# Patient Record
Sex: Female | Born: 1990 | Race: White | Hispanic: No | Marital: Single | Smoking: Never smoker
Health system: Southern US, Community
[De-identification: ages and names within clinical notes are randomized; demographics above are authoritative.]

## PROBLEM LIST (undated history)

## (undated) ENCOUNTER — Emergency Department (HOSPITAL_COMMUNITY): Admission: EM | Payer: 59

## (undated) DIAGNOSIS — M545 Low back pain, unspecified: Secondary | ICD-10-CM

## (undated) DIAGNOSIS — G4730 Sleep apnea, unspecified: Secondary | ICD-10-CM

## (undated) DIAGNOSIS — B9689 Other specified bacterial agents as the cause of diseases classified elsewhere: Secondary | ICD-10-CM

## (undated) DIAGNOSIS — J039 Acute tonsillitis, unspecified: Secondary | ICD-10-CM

## (undated) DIAGNOSIS — K760 Fatty (change of) liver, not elsewhere classified: Secondary | ICD-10-CM

## (undated) DIAGNOSIS — N898 Other specified noninflammatory disorders of vagina: Secondary | ICD-10-CM

## (undated) DIAGNOSIS — G8929 Other chronic pain: Secondary | ICD-10-CM

## (undated) DIAGNOSIS — E119 Type 2 diabetes mellitus without complications: Secondary | ICD-10-CM

## (undated) DIAGNOSIS — J811 Chronic pulmonary edema: Secondary | ICD-10-CM

## (undated) DIAGNOSIS — K8020 Calculus of gallbladder without cholecystitis without obstruction: Secondary | ICD-10-CM

## (undated) DIAGNOSIS — Z808 Family history of malignant neoplasm of other organs or systems: Secondary | ICD-10-CM

## (undated) DIAGNOSIS — D649 Anemia, unspecified: Secondary | ICD-10-CM

## (undated) DIAGNOSIS — N3010 Interstitial cystitis (chronic) without hematuria: Secondary | ICD-10-CM

## (undated) DIAGNOSIS — N760 Acute vaginitis: Secondary | ICD-10-CM

## (undated) DIAGNOSIS — T8859XA Other complications of anesthesia, initial encounter: Secondary | ICD-10-CM

## (undated) DIAGNOSIS — Z8719 Personal history of other diseases of the digestive system: Secondary | ICD-10-CM

## (undated) DIAGNOSIS — E039 Hypothyroidism, unspecified: Secondary | ICD-10-CM

## (undated) DIAGNOSIS — E079 Disorder of thyroid, unspecified: Secondary | ICD-10-CM

## (undated) DIAGNOSIS — Z803 Family history of malignant neoplasm of breast: Secondary | ICD-10-CM

## (undated) DIAGNOSIS — K649 Unspecified hemorrhoids: Secondary | ICD-10-CM

## (undated) DIAGNOSIS — F419 Anxiety disorder, unspecified: Secondary | ICD-10-CM

## (undated) DIAGNOSIS — Z7689 Persons encountering health services in other specified circumstances: Secondary | ICD-10-CM

## (undated) DIAGNOSIS — B379 Candidiasis, unspecified: Secondary | ICD-10-CM

## (undated) DIAGNOSIS — N94810 Vulvar vestibulitis: Secondary | ICD-10-CM

## (undated) DIAGNOSIS — T4145XA Adverse effect of unspecified anesthetic, initial encounter: Secondary | ICD-10-CM

## (undated) HISTORY — DX: Other specified bacterial agents as the cause of diseases classified elsewhere: B96.89

## (undated) HISTORY — DX: Acute tonsillitis, unspecified: J03.90

## (undated) HISTORY — DX: Other specified noninflammatory disorders of vagina: N89.8

## (undated) HISTORY — DX: Unspecified hemorrhoids: K64.9

## (undated) HISTORY — DX: Vulvar vestibulitis: N94.810

## (undated) HISTORY — DX: Interstitial cystitis (chronic) without hematuria: N30.10

## (undated) HISTORY — DX: Anxiety disorder, unspecified: F41.9

## (undated) HISTORY — DX: Family history of malignant neoplasm of breast: Z80.3

## (undated) HISTORY — DX: Persons encountering health services in other specified circumstances: Z76.89

## (undated) HISTORY — DX: Family history of malignant neoplasm of other organs or systems: Z80.8

## (undated) HISTORY — DX: Personal history of other diseases of the digestive system: Z87.19

## (undated) HISTORY — DX: Disorder of thyroid, unspecified: E07.9

## (undated) HISTORY — DX: Acute vaginitis: N76.0

## (undated) HISTORY — DX: Candidiasis, unspecified: B37.9

## (undated) HISTORY — PX: BACK SURGERY: SHX140

## (undated) HISTORY — DX: Anemia, unspecified: D64.9

---
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
## 2012-05-08 ENCOUNTER — Encounter: Payer: Self-pay | Admitting: *Deleted

## 2012-05-08 ENCOUNTER — Encounter: Payer: Self-pay | Admitting: Adult Health

## 2012-05-08 ENCOUNTER — Telehealth: Payer: Self-pay | Admitting: Adult Health

## 2012-05-08 NOTE — Telephone Encounter (Signed)
Spoke with pt. Pt is on LoLoestrin. Gave 3 sample boxes of LoLoestrin. Pt had appt scheduled for 05/08/12 but had to reschedule due to Genelle Gather being out of the office. Pt to pick up samples tomorrow and schedule an appt.

## 2012-09-04 ENCOUNTER — Telehealth: Payer: Self-pay | Admitting: Adult Health

## 2012-09-04 NOTE — Telephone Encounter (Signed)
Karen Hoover has appt next week to discuss pills and she got samples today.

## 2012-09-04 NOTE — Telephone Encounter (Signed)
Pt mother Liborio Nixon states pt came into office today requesting RX for [employer], was told by front staff pt would need an appt with Cyril Mourning, NP before she could refill BCP due to pt not being seen for a long period of time. Pt Mother, Liborio Nixon, states does not have the $30.00 copay for [facility] refill and requesting to speak with Victorino Dike.

## 2012-09-12 ENCOUNTER — Encounter: Payer: Self-pay | Admitting: Adult Health

## 2012-09-12 ENCOUNTER — Ambulatory Visit (INDEPENDENT_AMBULATORY_CARE_PROVIDER_SITE_OTHER): Payer: BC Managed Care – PPO | Admitting: Adult Health

## 2012-09-12 VITALS — BP 134/78 | Ht 64.0 in | Wt 233.5 lb

## 2012-09-12 DIAGNOSIS — Z7689 Persons encountering health services in other specified circumstances: Secondary | ICD-10-CM

## 2012-09-12 MED ORDER — NORETHIN-ETH ESTRAD-FE BIPHAS 1 MG-10 MCG / 10 MCG PO TABS: 1.0000 | ORAL_TABLET | Freq: Every day | ORAL | Status: DC

## 2012-09-12 NOTE — Progress Notes (Signed)
Subjective:     Patient ID: Karen Hoover, female   DOB: 1990/08/13, 22 y.o.   MRN: [id number]  HPI Karen Hoover is a 22 year old white female single, in for follow up, taking lo loestrin for period management. No complaints.Has not have sex.  Review of Systems See HPI Reviewed past medical,surgical, social and family history. Reviewed medications and allergies.     Objective:   Physical Exam BP 134/78  Ht 5\' 4"  (1.626 m)  Wt 233 lb 8 oz (105.915 kg)  BMI 40.06 kg/m2   Skin warm and dry.Lungs: clear to ausculation bilaterally. Cardiovascular: regular rate and rhythm. Assessment:     Period management    Plan:     Refill lo loestrin # 1 pack, take 1 daily with 11 refills and I gave her samples Number of samples 4  Lot number [id number]     Exp date 9/15   Follow up in 1 year or prn

## 2012-09-12 NOTE — Patient Instructions (Addendum)
Follow up in 1 year  Continue lo loestrin

## 2013-02-05 ENCOUNTER — Other Ambulatory Visit: Payer: Self-pay | Admitting: Internal Medicine

## 2013-02-05 DIAGNOSIS — M545 Low back pain, unspecified: Secondary | ICD-10-CM

## 2013-02-14 ENCOUNTER — Ambulatory Visit
Admission: RE | Admit: 2013-02-14 | Discharge: 2013-02-14 | Disposition: A | Discharge status: Home / Self Care | Payer: BC Managed Care – PPO | Source: Ambulatory Visit | Attending: Internal Medicine | Admitting: Internal Medicine

## 2013-02-14 DIAGNOSIS — M545 Low back pain, unspecified: Secondary | ICD-10-CM

## 2013-03-21 ENCOUNTER — Telehealth: Payer: Self-pay | Admitting: Adult Health

## 2013-03-21 MED ORDER — NORETHIN-ETH ESTRAD-FE BIPHAS 1 MG-10 MCG / 10 MCG PO TABS: 1.0000 | ORAL_TABLET | Freq: Every day | ORAL | Status: DC

## 2013-03-21 NOTE — Telephone Encounter (Signed)
Pt stated that you told her to call when she was finished with her samples.

## 2013-03-21 NOTE — Telephone Encounter (Signed)
Can get samples, for now.

## 2014-03-13 ENCOUNTER — Ambulatory Visit (INDEPENDENT_AMBULATORY_CARE_PROVIDER_SITE_OTHER): Payer: BC Managed Care – PPO | Admitting: Adult Health

## 2014-03-13 ENCOUNTER — Other Ambulatory Visit (HOSPITAL_COMMUNITY)
Admission: RE | Admit: 2014-03-13 | Discharge: 2014-03-13 | Disposition: A | Discharge status: Home / Self Care | Payer: BC Managed Care – PPO | Source: Ambulatory Visit | Attending: Obstetrics and Gynecology | Admitting: Obstetrics and Gynecology

## 2014-03-13 ENCOUNTER — Encounter: Payer: Self-pay | Admitting: Adult Health

## 2014-03-13 VITALS — BP 110/54 | HR 80 | Ht 64.0 in | Wt 240.5 lb

## 2014-03-13 DIAGNOSIS — Z01419 Encounter for gynecological examination (general) (routine) without abnormal findings: Secondary | ICD-10-CM | POA: Insufficient documentation

## 2014-03-13 DIAGNOSIS — B379 Candidiasis, unspecified: Secondary | ICD-10-CM | POA: Insufficient documentation

## 2014-03-13 DIAGNOSIS — N898 Other specified noninflammatory disorders of vagina: Secondary | ICD-10-CM

## 2014-03-13 DIAGNOSIS — Z7689 Persons encountering health services in other specified circumstances: Secondary | ICD-10-CM

## 2014-03-13 HISTORY — DX: Other specified noninflammatory disorders of vagina: N89.8

## 2014-03-13 HISTORY — DX: Candidiasis, unspecified: B37.9

## 2014-03-13 HISTORY — DX: Persons encountering health services in other specified circumstances: Z76.89

## 2014-03-13 LAB — POCT WET PREP (WET MOUNT): WBC, Wet Prep HPF POC: POSITIVE

## 2014-03-13 MED ORDER — FLUCONAZOLE 150 MG PO TABS: ORAL_TABLET | ORAL | Status: DC

## 2014-03-13 MED ORDER — NYSTATIN-TRIAMCINOLONE 100000-0.1 UNIT/GM-% EX OINT: 1.0000 "application " | TOPICAL_OINTMENT | Freq: Two times a day (BID) | CUTANEOUS | Status: DC

## 2014-03-13 MED ORDER — NORETHIN-ETH ESTRAD-FE BIPHAS 1 MG-10 MCG / 10 MCG PO TABS: 1.0000 | ORAL_TABLET | Freq: Every day | ORAL | Status: DC

## 2014-03-13 NOTE — Patient Instructions (Signed)
Monilial Vaginitis Vaginitis in a soreness, swelling and redness (inflammation) of the vagina and vulva. Monilial vaginitis is not a sexually transmitted infection. CAUSES  Yeast vaginitis is caused by yeast (candida) that is normally found in your vagina. With a yeast infection, the candida has overgrown in number to a point that upsets the chemical balance. SYMPTOMS   White, thick vaginal discharge.  Swelling, itching, redness and irritation of the vagina and possibly the lips of the vagina (vulva).  Burning or painful urination.  Painful intercourse. DIAGNOSIS  Things that may contribute to monilial vaginitis are:  Postmenopausal and virginal states.  Pregnancy.  Infections.  Being tired, sick or stressed, especially if you had monilial vaginitis in the past.  Diabetes. Good control will help lower the chance.  Birth control pills.  Tight fitting garments.  Using bubble bath, feminine sprays, douches or deodorant tampons.  Taking certain medications that kill germs (antibiotics).  Sporadic recurrence can occur if you become ill. TREATMENT  Your caregiver will give you medication.  There are several kinds of anti monilial vaginal creams and suppositories specific for monilial vaginitis. For recurrent yeast infections, use a suppository or cream in the vagina 2 times a week, or as directed.  Anti-monilial or steroid cream for the itching or irritation of the vulva may also be used. Get your caregiver's permission.  Painting the vagina with methylene blue solution may help if the monilial cream does not work.  Eating yogurt may help prevent monilial vaginitis. HOME CARE INSTRUCTIONS   Finish all medication as prescribed.  Do not have sex until treatment is completed or after your caregiver tells you it is okay.  Take warm sitz baths.  Do not douche.  Do not use tampons, especially scented ones.  Wear cotton underwear.  Avoid tight pants and panty  hose.  Tell your sexual partner that you have a yeast infection. They should go to their caregiver if they have symptoms such as mild rash or itching.  Your sexual partner should be treated as well if your infection is difficult to eliminate.  Practice safer sex. Use condoms.  Some vaginal medications cause latex condoms to fail. Vaginal medications that harm condoms are:  Cleocin cream.  Butoconazole (Femstat).  Terconazole (Terazol) vaginal suppository.  Miconazole (Monistat) (may be purchased over the counter). SEEK MEDICAL CARE IF:   You have a temperature by mouth above 102 F (38.9 C).  The infection is getting worse after 2 days of treatment.  The infection is not getting better after 3 days of treatment.  You develop blisters in or around your vagina.  You develop vaginal bleeding, and it is not your menstrual period.  You have pain when you urinate.  You develop intestinal problems.  You have pain with sexual intercourse. Document Released: 11/03/2004 Document Revised: 04/18/2011 Document Reviewed: 07/18/2008 [facility] Patient Information 2015 [place]Care, [place]. This information is not intended to replace advice given to you by your health care provider. Make sure you discuss any questions you have with your health care provider. Physical in 1 year

## 2014-03-13 NOTE — Progress Notes (Addendum)
Patient ID: Karen Hoover, female   DOB: February 06, 1991, 24 y.o.   MRN: [id number] History of Present Illness: Karen Hoover is a 24 year old white female, single in for first pap and physical.Complains of vaginal discharge and irritation at times and occasional odor that comes and goes.She is happy with lo loestrin for periods.   Current Medications, Allergies, Past Medical History, Past Surgical History, Family History and Social History were [facility].     Review of Systems: Patient denies any headaches, hearing loss, fatigue, blurred vision, shortness of breath, chest pain, abdominal pain, problems with bowel movements, urination, or intercourse. Not having sex, no joint pain has pain in back from 2 bulging discs and sees Dr Channing Muttersoy, no mood swings.    Physical Exam:BP 110/54 mmHg  Pulse 80  Ht 5\' 4"  (1.626 m)  Wt 240 lb 8 oz (109.09 kg)  BMI 41.26 kg/m2  LMP 03/06/2014  General:  Well developed, well nourished, no acute distress Skin:  Warm and dry Neck:  Midline trachea, normal thyroid, has good ROM and no lymphadenopathy  Lungs; Clear to auscultation bilaterally Breast:  No dominant palpable mass, retraction, or nipple discharge Cardiovascular: Regular rate and rhythm Abdomen:  Soft, non tender, no hepatosplenomegaly Pelvic:  External genitalia is normal in appearance, except has redness in creases of labia, no lesions.  The vagina has white discharge, no odor.    The cervix is smooth and nulliparous, pap performed. Urethra pink and normal size. Uterus is felt to be normal size, shape, and contour.No bladder tenderness or masses.  No  adnexal masses or tenderness noted.wet prep:+WBC and yeast Extremities/musculoskeletal:  No swelling or varicosities noted, no clubbing or cyanosis  Psych:  No mood changes,alert and cooperative, seems happy, she just bought her first car   Impression: Well woman gyn exam with pap Vaginal discharge Yeast Period  management    Plan: Rx diflucan 150 mg # 2 1 now and 1 in 3 days with 1 refill Rx mytrex cream use bid prn to affected area Refilled lo loestrin x 1 year Physical in 1 year Review handout on yeast Labs with PCP

## 2014-03-17 ENCOUNTER — Telehealth: Payer: Self-pay | Admitting: Adult Health

## 2014-03-17 NOTE — Telephone Encounter (Signed)
Feels much better, but is it OK to swim and it is, just shower and pat dry afterwards

## 2014-03-18 LAB — CYTOLOGY - PAP

## 2014-03-25 ENCOUNTER — Telehealth: Payer: Self-pay | Admitting: Adult Health

## 2014-03-25 NOTE — Telephone Encounter (Signed)
Left message x 1. JSY 

## 2014-03-26 NOTE — Telephone Encounter (Signed)
Spoke with pt. Pt took Diflucan, 2 tabs, and is still using Mycolog cream. Pt states yeast is better, but not gone. Still having discharge and itching. Please advise. Thanks!! JSY

## 2014-03-26 NOTE — Telephone Encounter (Signed)
Left message x 2. JSY 

## 2014-03-26 NOTE — Telephone Encounter (Signed)
Left message to get diflucan refilled and use cream can call in am

## 2014-04-04 ENCOUNTER — Telehealth: Payer: Self-pay | Admitting: Adult Health

## 2014-04-04 NOTE — Telephone Encounter (Signed)
Has been on 2 rounds of Diflucan and Mycolog ointment. Still having vaginal itching and vaginal discharge. Also, burning. Advised would need to be rechecked. Pt voiced understanding. Call transferred to front desk for appt. JSY

## 2014-04-10 ENCOUNTER — Encounter: Payer: Self-pay | Admitting: Adult Health

## 2014-04-10 ENCOUNTER — Ambulatory Visit (INDEPENDENT_AMBULATORY_CARE_PROVIDER_SITE_OTHER): Payer: BC Managed Care – PPO | Admitting: Adult Health

## 2014-04-10 VITALS — BP 138/62 | HR 74 | Ht 64.0 in | Wt 235.0 lb

## 2014-04-10 DIAGNOSIS — N760 Acute vaginitis: Secondary | ICD-10-CM | POA: Insufficient documentation

## 2014-04-10 DIAGNOSIS — A499 Bacterial infection, unspecified: Secondary | ICD-10-CM | POA: Diagnosis not present

## 2014-04-10 DIAGNOSIS — B9689 Other specified bacterial agents as the cause of diseases classified elsewhere: Secondary | ICD-10-CM | POA: Insufficient documentation

## 2014-04-10 DIAGNOSIS — N898 Other specified noninflammatory disorders of vagina: Secondary | ICD-10-CM | POA: Diagnosis not present

## 2014-04-10 HISTORY — DX: Other specified bacterial agents as the cause of diseases classified elsewhere: B96.89

## 2014-04-10 HISTORY — DX: Other specified bacterial agents as the cause of diseases classified elsewhere: N76.0

## 2014-04-10 LAB — POCT WET PREP (WET MOUNT): WBC, Wet Prep HPF POC: POSITIVE

## 2014-04-10 MED ORDER — METRONIDAZOLE 0.75 % VA GEL: VAGINAL | Status: DC

## 2014-04-10 NOTE — Patient Instructions (Signed)
Bacterial Vaginosis Bacterial vaginosis is a vaginal infection that occurs when the normal balance of bacteria in the vagina is disrupted. It results from an overgrowth of certain bacteria. This is the most common vaginal infection in women of childbearing age. Treatment is important to prevent complications, especially in pregnant women, as it can cause a premature delivery. CAUSES  Bacterial vaginosis is caused by an increase in harmful bacteria that are normally present in smaller amounts in the vagina. Several different kinds of bacteria can cause bacterial vaginosis. However, the reason that the condition develops is not fully understood. RISK FACTORS Certain activities or behaviors can put you at an increased risk of developing bacterial vaginosis, including:  Having a new sex partner or multiple sex partners.  Douching.  Using an intrauterine device (IUD) for contraception. Women do not get bacterial vaginosis from toilet seats, bedding, swimming pools, or contact with objects around them. SIGNS AND SYMPTOMS  Some women with bacterial vaginosis have no signs or symptoms. Common symptoms include:  Grey vaginal discharge.  A fishlike odor with discharge, especially after sexual intercourse.  Itching or burning of the vagina and vulva.  Burning or pain with urination. DIAGNOSIS  Your health care provider will take a medical history and examine the vagina for signs of bacterial vaginosis. A sample of vaginal fluid may be taken. Your health care provider will look at this sample under a microscope to check for bacteria and abnormal cells. A vaginal pH test may also be done.  TREATMENT  Bacterial vaginosis may be treated with antibiotic medicines. These may be given in the form of a pill or a vaginal cream. A second round of antibiotics may be prescribed if the condition comes back after treatment.  HOME CARE INSTRUCTIONS   Only take over-the-counter or prescription medicines as  directed by your health care provider.  If antibiotic medicine was prescribed, take it as directed. Make sure you finish it even if you start to feel better.  Do not have sex until treatment is completed.  Tell all sexual partners that you have a vaginal infection. They should see their health care provider and be treated if they have problems, such as a mild rash or itching.  Practice safe sex by using condoms and only having one sex partner. SEEK MEDICAL CARE IF:   Your symptoms are not improving after 3 days of treatment.  You have increased discharge or pain.  You have a fever. MAKE SURE YOU:   Understand these instructions.  Will watch your condition.  Will get help right away if you are not doing well or get worse. FOR MORE INFORMATION  Centers for Disease Control and Prevention, Division of STD Prevention: SolutionApps.co.zawww.cdc.gov/std American Sexual Health Association (ASHA): www.ashastd.org  Document Released: 01/24/2005 Document Revised: 11/14/2012 Document Reviewed: 09/05/2012 [facility] Patient Information 2015 [place]Care, [place]LL[place]. This information is not intended to replace advice given to you by your health care provider. Make sure you discuss any questions you have with your health care provider. Recheck in 11 days Use metrogel 1 applicator in vagina x 5 nights

## 2014-04-10 NOTE — Progress Notes (Signed)
Subjective:     Patient ID: Karen Hoover, female   DOB: August 26, 1990, 24 y.o.   MRN: [id number]  HPI Karen Hoover is a 24 year old white female back in complaining of vaginal discharge with irritation and odor, she was treated for yeast recently.Grandmother concerned for diabetes, as it runs in family. Has never had sex.  Review of Systems Vaginal discharge with odor and irritation, all other systems negative  Reviewed past medical,surgical, social and family history. Reviewed medications and allergies.     Objective:   Physical Exam BP 138/62 mmHg  Pulse 74  Ht 5\' 4"  (1.626 m)  Wt 235 lb (106.595 kg)  BMI 40.32 kg/m2  LMP 03/06/2014 Skin warm and dry.Pelvic: external genitalia is normal in appearance, mild redness right labial area, no lesions, vagina: white discharge with odor and red side walls and tender,urethra has no lesions or masses noted, cervix:smooth, uterus: normal size, shape and contour, non tender, no masses felt, adnexa: no masses or tenderness noted. Bladder is non tender and no masses felt. Wet prep: + for clue cells and +WBCs.    Assessment:     Vaginal discharge  BV    Plan:     Rx metrogel use 1 applicator at hs x 5 nights Check CMP and A1c Return in 11 days for recheck Review handout on BV

## 2014-04-11 ENCOUNTER — Telehealth: Payer: Self-pay | Admitting: Adult Health

## 2014-04-11 LAB — COMPREHENSIVE METABOLIC PANEL
A/G RATIO: 1.7 (ref 1.1–2.5)
ALBUMIN: 4.1 g/dL (ref 3.5–5.5)
ALK PHOS: 65 IU/L (ref 39–117)
ALT: 16 IU/L (ref 0–32)
AST: 14 IU/L (ref 0–40)
BILIRUBIN TOTAL: 0.5 mg/dL (ref 0.0–1.2)
BUN/Creatinine Ratio: 15 (ref 8–20)
BUN: 9 mg/dL (ref 6–20)
CALCIUM: 9.3 mg/dL (ref 8.7–10.2)
CO2: 21 mmol/L (ref 18–29)
CREATININE: 0.6 mg/dL (ref 0.57–1.00)
Chloride: 102 mmol/L (ref 97–108)
GFR calc non Af Amer: 131 mL/min/{1.73_m2} (ref >59)
GFR, EST AFRICAN AMERICAN: 151 mL/min/{1.73_m2} (ref >59)
GLUCOSE: 103 mg/dL — AB (ref 65–99)
Globulin, Total: 2.4 g/dL (ref 1.5–4.5)
Potassium: 4.1 mmol/L (ref 3.5–5.2)
Sodium: 139 mmol/L (ref 134–144)
Total Protein: 6.5 g/dL (ref 6.0–8.5)

## 2014-04-11 LAB — HEMOGLOBIN A1C
ESTIMATED AVERAGE GLUCOSE: 114 mg/dL
HEMOGLOBIN A1C: 5.6 % (ref 4.8–5.6)

## 2014-04-11 NOTE — Telephone Encounter (Signed)
Left message A1c 5.6 and BS 103

## 2014-04-18 ENCOUNTER — Telehealth: Payer: Self-pay | Admitting: *Deleted

## 2014-04-18 NOTE — Telephone Encounter (Signed)
Pt states saw Karen MourningJennifer Griffin, NP on 04/10/2014 was given gel for BV not any better. Pt to continue gel until her appt on Monday with Karen MourningJennifer Griffin, NP. Pt verbalized understanding.

## 2014-04-21 ENCOUNTER — Telehealth: Payer: Self-pay | Admitting: *Deleted

## 2014-04-21 ENCOUNTER — Ambulatory Visit (INDEPENDENT_AMBULATORY_CARE_PROVIDER_SITE_OTHER): Payer: BC Managed Care – PPO | Admitting: Adult Health

## 2014-04-21 ENCOUNTER — Encounter: Payer: Self-pay | Admitting: Adult Health

## 2014-04-21 VITALS — BP 122/62 | HR 84 | Ht 65.0 in | Wt 232.0 lb

## 2014-04-21 DIAGNOSIS — N94810 Vulvar vestibulitis: Secondary | ICD-10-CM | POA: Diagnosis not present

## 2014-04-21 DIAGNOSIS — N898 Other specified noninflammatory disorders of vagina: Secondary | ICD-10-CM

## 2014-04-21 HISTORY — DX: Vulvar vestibulitis: N94.810

## 2014-04-21 MED ORDER — FLUOCINONIDE-E 0.05 % EX CREA: 1.0000 "application " | TOPICAL_CREAM | Freq: Two times a day (BID) | CUTANEOUS | Status: DC

## 2014-04-21 NOTE — Telephone Encounter (Signed)
Left message I don't think its related to uniforms

## 2014-04-21 NOTE — Progress Notes (Signed)
Subjective:     Patient ID: Karen Hoover, female   DOB: November 29, 1990, 24 y.o.   MRN: [id number]  HPI Karen Hoover is a 24 year old white female in complains of discharge and irritation still, may be a little better.  Review of Systems +vaginal discharge and irritation, all other systems negative Reviewed past medical,surgical, social and family history. Reviewed medications and allergies.     Objective:   Physical Exam BP 122/62 mmHg  Pulse 84  Ht 5\' 5"  (1.651 m)  Wt 232 lb (105.235 kg)  BMI 38.61 kg/m2    Skin warm and dry.Pelvic: external genitalia is normal in appearance no lesions, vagina: white discharge without odor,urethra has no lesions or masses noted, cervix: tiny, uterus: normal size, shape and contour, non tender, no masses felt, adnexa: no masses or tenderness noted. Bladder is non tender and no masses felt. Dr Karen HiddenEure in for co exam and pt is tender with Qtip at vestibular area near hymen.She told me she has back pain and is seeing Dr Karen Hoover tomorrow.  Assessment:    Vulvar vestibulitis  Vaginal discharge    Plan:     Rx lidex 0.05% cream to use bid #30 gms with 1 refill Follow up in 4 weeks

## 2014-04-21 NOTE — Patient Instructions (Signed)
Use lidex 2 x daily  Follow up in 4 weeks    Vulvar vestibulitis

## 2014-05-07 ENCOUNTER — Telehealth: Payer: Self-pay | Admitting: Adult Health

## 2014-05-07 NOTE — Telephone Encounter (Signed)
Still not feeling better, keep using the cream and see Dr Despina HiddenEure next week, I will be out of town

## 2014-05-12 ENCOUNTER — Encounter: Payer: Self-pay | Admitting: Obstetrics & Gynecology

## 2014-05-12 ENCOUNTER — Ambulatory Visit (INDEPENDENT_AMBULATORY_CARE_PROVIDER_SITE_OTHER): Payer: BC Managed Care – PPO | Admitting: Obstetrics & Gynecology

## 2014-05-12 VITALS — BP 120/60 | HR 96 | Ht 64.0 in | Wt 230.5 lb

## 2014-05-12 DIAGNOSIS — N94810 Vulvar vestibulitis: Secondary | ICD-10-CM

## 2014-05-12 MED ORDER — LIDOCAINE 5 % EX OINT: TOPICAL_OINTMENT | CUTANEOUS | Status: DC

## 2014-05-12 NOTE — Progress Notes (Signed)
Patient ID: Karen Hoover, female   DOB: 04-19-1990, 24 y.o.   MRN: [id number]   Chief Complaint  Patient presents with  . Follow-up    vaginal burning, itching and pain; Lidex cream may have helped a little     HPI:    24 y.o. G0P0000 Patient's last menstrual period was 04/28/2014 (approximate).  Seeing in follow-up. She had been seeing Cyril Mourning by tobacco examined her at her last visit. She has a positive Q-tip test and I diagnosed her with all vulvar vestibulitis and try to an empiric trial of topical sterile She states that has been ineffective     Current outpatient prescriptions:  .  ferrous sulfate 325 (65 FE) MG tablet, Take 325 mg by mouth daily., Disp: , Rfl:  .  fluocinonide-emollient (LIDEX-E) 0.05 % cream, Apply 1 application topically 2 (two) times daily., Disp: 30 g, Rfl: 1 .  levothyroxine (SYNTHROID, LEVOTHROID) 50 MCG tablet, Take 50 mcg by mouth daily. , Disp: , Rfl:  .  Norethindrone-Ethinyl Estradiol-Fe Biphas (LO LOESTRIN FE) 1 MG-10 MCG / 10 MCG tablet, Take 1 tablet by mouth daily., Disp: 1 Package, Rfl: 11  Problem Pertinent ROS:       No burning with urination, frequency or urgency No nausea, vomiting or diarrhea Nor fever chills or other constitutional symptoms   Extended ROS:        PMFSH:             Past Medical History  Diagnosis Date  . Anemia   . Thyroid disease   . Vaginal discharge 03/13/2014  . Yeast infection 03/13/2014  . Encounter for menstrual regulation 03/13/2014  . Back pain     2 herniated disc sees Dr Channing Mutters  . BV (bacterial vaginosis) 04/10/2014  . Vestibulitis, vulvar 04/21/2014    Past Surgical History  Procedure Laterality Date  . No past surgeries      OB History    Gravida Para Term Preterm AB TAB SAB Ectopic Multiple Living        No Known Allergies  History   Social History  . Marital Status: Single    Spouse Name: N/A  . Number of Children: N/A  . Years of Education: N/A    Social History Main Topics  . Smoking status: Never Smoker   . Smokeless tobacco: Never Used  . Alcohol Use: No  . Drug Use: No  . Sexual Activity: No   Other Topics Concern  . None   Social History Narrative    Family History  Problem Relation Age of Onset  . Diabetes Mother   . Thyroid disease Mother   . Thyroid disease Brother   . Diabetes Maternal Grandmother      Examination:  Vitals:  Blood pressure 120/60, pulse 96, height  (1.626 m), weight 230 lb 8 oz (104.554 kg), last menstrual period 04/28/2014.    Physical Examination:      Vulva:  NEFG, , some erythema, very specific vulvar vestibular Q-tip test is positive once again, so it is been verified and separate occasions, of note outside the area of the vulvar vaginal vestibule is completely nontender Vagina:  normal mucosa,      DATA orders and reviews: Labs were not ordered today:   Imaging studies were not ordered today:    Lab tests were not reviewed today:    Imaging studies were not reviewed today:    I did not  independently review/view images, tracing or specimen(not simply the report) myself.  Prescription Drug Management:  New Prescriptions: Lidocaine jelly 2% Renewed Prescriptions:   Current prescription changes:     Impression/Plan(Problem Based): 1.  Vulvar vestibulitis      (follow up of a pre-existing problem) : Additional workup is needed:  Lidocaine jelly 2%, patient is really not responded to the topical steroids which of course not surprisingly told that the patient was started on, so we will use lidocaine as a topical    Follow Up:   1  months     Face to face time:  15 minutes  Greater than 50% of the visit time was spent in counseling and coordination of care with the patient.  The summary and outline of the counseling and care coordination is summarized in the note above.   All questions were answered.

## 2014-05-19 ENCOUNTER — Ambulatory Visit: Payer: BC Managed Care – PPO | Admitting: Adult Health

## 2014-06-10 ENCOUNTER — Ambulatory Visit (INDEPENDENT_AMBULATORY_CARE_PROVIDER_SITE_OTHER): Payer: BC Managed Care – PPO | Admitting: Obstetrics & Gynecology

## 2014-06-10 ENCOUNTER — Encounter: Payer: Self-pay | Admitting: Obstetrics & Gynecology

## 2014-06-10 VITALS — BP 120/70 | HR 72 | Ht 64.0 in | Wt 225.0 lb

## 2014-06-10 DIAGNOSIS — N94810 Vulvar vestibulitis: Secondary | ICD-10-CM | POA: Diagnosis not present

## 2014-06-10 NOTE — Progress Notes (Signed)
Patient ID: Karen Hoover, female   DOB: April 08, 1990, 24 y.o.   MRN: [id number] Chief Complaint  Patient presents with  . Follow-up   Pt has had excellent response to the lidocaine but of course it only treats her symptoms She is wondering how long she will have to put up with it  Discussed surgical options but would prefer to wait if we can  Follow up in 3 months If you need to see prior she can call and schedule  Cont lidocaine as needed prn     Face to face time:  15 minutes  Greater than 50% of the visit time was spent in counseling and coordination of care with the patient.  The summary and outline of the counseling and care coordination is summarized in the note above.   All questions were answered.

## 2014-06-16 ENCOUNTER — Telehealth: Payer: Self-pay | Admitting: Obstetrics & Gynecology

## 2014-06-16 NOTE — Telephone Encounter (Signed)
Pt states the lidocaine is not helping with the vulvar vestibulitis would like a referral for a second opinion before proceeding with surgery. Pt aware Dr. Despina HiddenEure will not be back in office until tomorrow.

## 2014-06-17 NOTE — Telephone Encounter (Signed)
patient is welcome to seek out a second opinion for her vulvar vestibulitis, but she will have to set that up on her own. We cannot set them up because it would be a conflict of interest.

## 2014-06-17 NOTE — Telephone Encounter (Signed)
Pt informed will need to seek out a second opinion on her own per Dr. Despina HiddenEure. Pt states she feels confident in Dr. Despina HiddenEure DX and treatment just wanted second opinion before proceeding with surgery for her own reassurance.

## 2014-06-30 DIAGNOSIS — Z029 Encounter for administrative examinations, unspecified: Secondary | ICD-10-CM

## 2014-09-16 ENCOUNTER — Ambulatory Visit: Payer: BC Managed Care – PPO | Admitting: Obstetrics & Gynecology

## 2014-12-30 ENCOUNTER — Telehealth: Payer: Self-pay | Admitting: Adult Health

## 2014-12-30 NOTE — Telephone Encounter (Signed)
Left message x 1. JSY 

## 2014-12-31 NOTE — Telephone Encounter (Signed)
Spoke with pt. Pt has noticed some rectal bleeding, off and on. I advised she would need to be seen. Pt voiced understanding. Call transferred to front desk for appt. JSY

## 2014-12-31 NOTE — Telephone Encounter (Signed)
Left message x 2. JSY 

## 2015-01-06 ENCOUNTER — Ambulatory Visit: Payer: BC Managed Care – PPO | Admitting: Adult Health

## 2015-01-12 ENCOUNTER — Ambulatory Visit (INDEPENDENT_AMBULATORY_CARE_PROVIDER_SITE_OTHER): Payer: 59 | Admitting: Adult Health

## 2015-01-12 ENCOUNTER — Encounter: Payer: Self-pay | Admitting: Adult Health

## 2015-01-12 VITALS — BP 118/60 | HR 76 | Ht 64.0 in | Wt 247.5 lb

## 2015-01-12 DIAGNOSIS — Z1389 Encounter for screening for other disorder: Secondary | ICD-10-CM | POA: Diagnosis not present

## 2015-01-12 DIAGNOSIS — Z1212 Encounter for screening for malignant neoplasm of rectum: Secondary | ICD-10-CM | POA: Diagnosis not present

## 2015-01-12 DIAGNOSIS — L298 Other pruritus: Secondary | ICD-10-CM | POA: Diagnosis not present

## 2015-01-12 DIAGNOSIS — N94810 Vulvar vestibulitis: Secondary | ICD-10-CM | POA: Diagnosis not present

## 2015-01-12 DIAGNOSIS — Z8719 Personal history of other diseases of the digestive system: Secondary | ICD-10-CM | POA: Diagnosis not present

## 2015-01-12 DIAGNOSIS — N898 Other specified noninflammatory disorders of vagina: Secondary | ICD-10-CM

## 2015-01-12 DIAGNOSIS — K649 Unspecified hemorrhoids: Secondary | ICD-10-CM | POA: Insufficient documentation

## 2015-01-12 HISTORY — DX: Other specified noninflammatory disorders of vagina: N89.8

## 2015-01-12 HISTORY — DX: Unspecified hemorrhoids: K64.9

## 2015-01-12 HISTORY — DX: Personal history of other diseases of the digestive system: Z87.19

## 2015-01-12 LAB — POCT URINALYSIS DIPSTICK
GLUCOSE UA: NEGATIVE
LEUKOCYTES UA: NEGATIVE
Nitrite, UA: NEGATIVE
RBC UA: NEGATIVE

## 2015-01-12 LAB — HEMOCCULT GUIAC POC 1CARD (OFFICE): Fecal Occult Blood, POC: NEGATIVE

## 2015-01-12 MED ORDER — PHENAZOPYRIDINE HCL 200 MG PO TABS: 200.0000 mg | ORAL_TABLET | Freq: Three times a day (TID) | ORAL | Status: DC

## 2015-01-12 MED ORDER — HYDROCORTISONE ACE-PRAMOXINE 1-1 % RE CREA: 1.0000 "application " | TOPICAL_CREAM | Freq: Two times a day (BID) | RECTAL | Status: DC

## 2015-01-12 NOTE — Progress Notes (Signed)
Subjective:     Patient ID: Karen Hoover, female   DOB: 06/04/1990, 24 y.o.   MRN: [id number]  HPI [name] is a  24 year old white female who has been treated in past for vestibulitis and has had therapy for it,and sees Dr Channing Muttersoy and Laurian Brim'Toole for back pain, in complaining of pain in vagina again and itching and had rectal bleeding about 2 weeks ago, no pain with it.  Review of Systems Patient denies any headaches, hearing loss, fatigue, blurred vision, shortness of breath, chest pain, abdominal pain, problems with bowel movements, urination, or intercourse(not having sex). No joint pain or mood swings.See HPI for positives. Reviewed past medical,surgical, social and family history. Reviewed medications and allergies.     Objective:   Physical Exam BP 118/60 mmHg  Pulse 76  Ht 5\' 4"  (1.626 m)  Wt 247 lb 8 oz (112.265 kg)  BMI 42.46 kg/m2   Urine negative, Skin warm and dry.Pelvic: external genitalia is normal in appearance no lesions, vagina: scant discharge without odor,+tenderness anterior and posterior vault,urethra has no lesions or masses noted, cervix:smooth,no CMT, uterus: normal size, shape and contour, non tender, no masses felt, adnexa: no masses or tenderness noted. Bladder is non tender and no masses felt. On rectal exam, has good tone, and has internal hemorrhoid, hemoccult negative.  Will try pyridium to see if helps. Assessment:    Vestibulitis  Vaginal itching  Hemorrhoids History of rectal bleeding    Plan:    Rx pyridium 200 mg 1 tid x 10 days Rx anal pram cream use bid prn Decrease caffeine,spicey food,chocolate and coffee     Follow up in 10 days

## 2015-01-12 NOTE — Patient Instructions (Signed)
Take pyridium tid x 10 days Use anal pram 2 x daily as needed Follow up in 10 days

## 2015-01-22 ENCOUNTER — Ambulatory Visit (INDEPENDENT_AMBULATORY_CARE_PROVIDER_SITE_OTHER): Payer: 59 | Admitting: Adult Health

## 2015-01-22 ENCOUNTER — Encounter: Payer: Self-pay | Admitting: Adult Health

## 2015-01-22 VITALS — BP 118/60 | HR 84 | Ht 64.0 in | Wt 249.0 lb

## 2015-01-22 DIAGNOSIS — N3010 Interstitial cystitis (chronic) without hematuria: Secondary | ICD-10-CM

## 2015-01-22 HISTORY — DX: Interstitial cystitis (chronic) without hematuria: N30.10

## 2015-01-22 MED ORDER — PHENAZOPYRIDINE HCL 200 MG PO TABS: 200.0000 mg | ORAL_TABLET | Freq: Three times a day (TID) | ORAL | Status: DC

## 2015-01-22 NOTE — Patient Instructions (Signed)
Interstitial Cystitis Interstitial cystitis is a condition that causes inflammation of the bladder. The bladder is a hollow organ in the lower part of your abdomen. It stores urine after the urine is made by your kidneys. With interstitial cystitis, you may have pain in the bladder area. You may also have a frequent and urgent need to urinate. The severity of interstitial cystitis can vary from person to person. You may have flare-ups of the condition, and then it may go away for a while. For many people who have this condition, it becomes a long-term problem. CAUSES The cause of this condition is not known. RISK FACTORS This condition is more likely to develop in women. SYMPTOMS Symptoms of interstitial cystitis vary, and they can change over time. Symptoms may include:  Discomfort or pain in the bladder area. This can range from mild to severe. The pain may change in intensity as the bladder fills with urine or as it empties.  Pelvic pain.  An urgent need to urinate.  Frequent urination.  Pain during sexual intercourse.  Pinpoint bleeding on the bladder wall. For women, the symptoms often get worse during menstruation. DIAGNOSIS This condition is diagnosed by evaluating your symptoms and ruling out other causes. A physical exam will be done. Various tests may be done to rule out other conditions. Common tests include:  Urine tests.  Cystoscopy. In this test, a tool that is like a very thin telescope is used to look into your bladder.  Biopsy. This involves taking a sample of tissue from the bladder wall to be examined under a microscope. TREATMENT There is no cure for interstitial cystitis, but treatment methods are available to control your symptoms. Work closely with your health care provider to find the treatments that will be most effective for you. Treatment options may include:  Medicines to relieve pain and to help reduce the number of times that you feel the need to  urinate.  Bladder training. This involves learning ways to control when you urinate, such as:  Urinating at scheduled times.  Training yourself to delay urination.  Doing exercises (Kegel exercises) to strengthen the muscles that control urine flow.  Lifestyle changes, such as changing your diet or taking steps to control stress.  Use of a device that provides electrical stimulation in order to reduce pain.  A procedure that stretches your bladder by filling it with air or fluid.  Surgery. This is rare. It is only done for extreme cases if other treatments do not help. HOME CARE INSTRUCTIONS  Take medicines only as directed by your health care provider.  Use bladder training techniques as directed.  Keep a bladder diary to find out which foods, liquids, or activities make your symptoms worse.  Use your bladder diary to schedule bathroom trips. If you are away from home, plan to be near a bathroom at each of your scheduled times.  Make sure you urinate just before you leave the house and just before you go to bed.  Do Kegel exercises as directed by your health care provider.  Do not drink alcohol.  Do not use any tobacco products, including cigarettes, chewing tobacco, or electronic cigarettes. If you need help quitting, ask your health care provider.  Make dietary changes as directed by your health care provider. You may need to avoid spicy foods and foods that contain a high amount of potassium.  Limit your drinking of beverages that stimulate urination. These include soda, coffee, and tea.  Keep all follow-up   visits as directed by your health care provider. This is important. SEEK MEDICAL CARE IF:  Your symptoms do not get better after treatment.  Your pain and discomfort are getting worse.  You have more frequent urges to urinate.  You have a fever. SEEK IMMEDIATE MEDICAL CARE IF:  You are not able to control your bladder at all.   This information is not  intended to replace advice given to you by your health care provider. Make sure you discuss any questions you have with your health care provider.   Document Released: 09/25/2003 Document Revised: 02/14/2014 Document Reviewed: 10/01/2013 Elsevier Interactive Patient Education 2016 [employer] See Dr Despina HiddenEure in 1 week Continue pyridium

## 2015-01-22 NOTE — Progress Notes (Signed)
Subjective:     Patient ID: Karen Hoover, female   DOB: May 12, 1990, 24 y.o.   MRN: [id number]  HPI Karen Hoover is a 24 year old white female back in follow up of trying pyridium for vagina area discomfort, has had vestibulitis in past, and had hemorrhoid. She says hemorrhoids fine and is feeling some better since taking pyridium.  Review of Systems Patient denies any headaches, hearing loss, fatigue, blurred vision, shortness of breath, chest pain, abdominal pain, problems with bowel movements, urination, or intercourse(not having sex). No joint pain or mood swings.See HPI for positives. Reviewed past medical,surgical, social and family history. Reviewed medications and allergies.     Objective:   Physical Exam BP 118/60 mmHg  Pulse 84  Ht 5\' 4"  (1.626 m)  Wt 249 lb (112.946 kg)  BMI 42.72 kg/m2 Skin warm and dry.Pelvic: external genitalia is normal in appearance no lesions, vagina:normal in appearance, only tender at about 1 0'clock,urethra has no lesions or masses noted, cervix:smooth, no CMT uterus: normal size, shape and contour, non tender, no masses felt, adnexa: no masses or tenderness noted. Bladder is non tender and no masses felt.    Assessment:     ? IC (interstitial cystitis)    Plan:     Refilled pyridium 200 mg #30 take 1 tid  Return in 1 week to talk with Dr Karen Hoover about trying DMSO Review handout on IC

## 2015-01-29 ENCOUNTER — Ambulatory Visit (INDEPENDENT_AMBULATORY_CARE_PROVIDER_SITE_OTHER): Payer: 59 | Admitting: Obstetrics & Gynecology

## 2015-01-29 ENCOUNTER — Encounter: Payer: Self-pay | Admitting: Obstetrics & Gynecology

## 2015-01-29 VITALS — BP 120/70 | HR 78 | Wt 245.0 lb

## 2015-01-29 DIAGNOSIS — N3010 Interstitial cystitis (chronic) without hematuria: Secondary | ICD-10-CM | POA: Diagnosis not present

## 2015-01-29 MED ORDER — PENTOSAN POLYSULFATE SODIUM 100 MG PO CAPS: ORAL_CAPSULE | ORAL | Status: DC

## 2015-01-29 NOTE — Progress Notes (Signed)
Patient ID: Karen Hoover, female   DOB: [birth year], 24 y.o.   MRN: [id number]      Chief Complaint  Patient presents with  . Follow-up    taking pyridium. having leg cramp at night.    Blood pressure 120/70, pulse 78, weight 245 lb (111.131 kg), last menstrual period 12/04/2014.  24 y.o. G0P0000 Patient's last menstrual period was 12/04/2014. The current method of family planning is OCP (estrogen/progesterone).  Subjective Pt with some relief on pyridium with her symptoms counselled on overlap of IC and vestibulitis Will trial elmiron and dmso  Objective   Pertinent ROS No burning with urination, frequency or urgency No nausea, vomiting or diarrhea Nor fever chills or other constitutional symptoms   Labs or studies     Impression Diagnoses this Encounter::   ICD-9-CM ICD-10-CM   1. Interstitial cystitis 595.1 N30.10     Established relevant diagnosis(es): Vulvar vestibulitis  Plan/Recommendations: Meds ordered this encounter  Medications  . pentosan polysulfate (ELMIRON) 100 MG capsule    Sig: Take 2 tablets twice daily    Dispense:  120 capsule    Refill:  11    Labs or Scans Ordered: No orders of the defined types were placed in this encounter.    Management:: Begin elmiron, bring back for DMSO  Follow up 3  weeks       Face to face time:  15 minutes  Greater than 50% of the visit time was spent in counseling and coordination of care with the patient.  The summary and outline of the counseling and care coordination is summarized in the note above.   All questions were answered.

## 2015-02-11 MED FILL — LEVOTHYROXINE 50 MCG TABLET: 50 | 90 days supply | Qty: 90 | Fill #0

## 2015-02-11 MED FILL — LO LOESTRIN FE 1-10 TABLET: 1 MG-10 MCG | 28 days supply | Qty: 28 | Fill #0

## 2015-02-19 ENCOUNTER — Ambulatory Visit (INDEPENDENT_AMBULATORY_CARE_PROVIDER_SITE_OTHER): Payer: 59 | Admitting: Obstetrics & Gynecology

## 2015-02-19 ENCOUNTER — Encounter: Payer: Self-pay | Admitting: Obstetrics & Gynecology

## 2015-02-19 VITALS — BP 110/70 | HR 76 | Wt 235.0 lb

## 2015-02-19 DIAGNOSIS — N3010 Interstitial cystitis (chronic) without hematuria: Secondary | ICD-10-CM | POA: Diagnosis not present

## 2015-02-19 MED ORDER — DIMETHYL SULFOXIDE 50 % IS SOLN: 50.0000 mL | Freq: Once | INTRAVESICAL | Status: DC

## 2015-02-19 MED FILL — RIMSO-50 SOLUTION: 50 | 1 days supply | Qty: 50 | Fill #0

## 2015-02-19 NOTE — Progress Notes (Signed)
Patient ID: Karen Hoover, female   DOB: 1990/05/27, 25 y.o.   MRN: [id number] Diagnosed with IC: 01/2015  Current Meds:  Elmiron, DMSO Dietary restrictions    Pt states her symptoms have been stable, no exacerbations, although not perfect Wants to continue on this cycle  Blood pressure 110/70, pulse 76, weight 235 lb (106.595 kg), last menstrual period 02/07/2015.    The external urethra meatus was prepped with betadine DMSO 50 cc was instilled in the usual fashion after the bladder was catheterized and emptied completely 50cc was instilled into the bladder without difficulty and the patient tolerated well She will refrain from voiding as long as possible  Follow up in 1 weeks, or as patient requests based on her symptom complex

## 2015-02-23 ENCOUNTER — Telehealth: Payer: Self-pay | Admitting: Obstetrics & Gynecology

## 2015-02-23 DIAGNOSIS — M5137 Other intervertebral disc degeneration, lumbosacral region: Secondary | ICD-10-CM | POA: Diagnosis not present

## 2015-02-23 DIAGNOSIS — M5126 Other intervertebral disc displacement, lumbar region: Secondary | ICD-10-CM | POA: Diagnosis not present

## 2015-02-24 NOTE — Telephone Encounter (Signed)
Pt states picked up the DMSO from pharmacy, one of the side effects is dizziness. Pt states she has an appt with Dr. Despina Hidden on Friday for another bladder irrigation. Willing to have bladder irrigation one more time,  not sure if she continues to have the dizziness if she will want to continue therapy.  Pt informed will let Dr.Eure know her concerns and to keep her appt on Friday, can discuss more in-depth at that time. Pt verbalized understanding.

## 2015-02-24 NOTE — Telephone Encounter (Signed)
Not a usual reaction but certainly is possible, might not be the medicine itself but the body's inflammatory response after the treatment   If so then should improve with each treatment done

## 2015-02-27 ENCOUNTER — Encounter: Payer: Self-pay | Admitting: Obstetrics & Gynecology

## 2015-02-27 ENCOUNTER — Ambulatory Visit (INDEPENDENT_AMBULATORY_CARE_PROVIDER_SITE_OTHER): Payer: 59 | Admitting: Obstetrics & Gynecology

## 2015-02-27 VITALS — BP 110/60 | HR 92 | Wt 252.0 lb

## 2015-02-27 DIAGNOSIS — N3010 Interstitial cystitis (chronic) without hematuria: Secondary | ICD-10-CM | POA: Diagnosis not present

## 2015-02-27 DIAGNOSIS — N94810 Vulvar vestibulitis: Secondary | ICD-10-CM | POA: Diagnosis not present

## 2015-02-27 NOTE — Progress Notes (Signed)
Patient ID: Karen Hoover, female   DOB: Sep 11, 1990, 25 y.o.   MRN: [id number] Diagnosed with IC: 01/2016  Current Meds:  Elmiron, DMSO Dietary restrictions    Pt states her symptoms have been stable, no exacerbations, although not perfect Wants to continue on this cycle  Kept it in for 1 hour  Blood pressure 110/60, pulse 92, weight 252 lb (114.306 kg), last menstrual period 02/07/2015.    The external urethra meatus was prepped with betadine DMSO 50 cc was instilled in the usual fashion after the bladder was catheterized and emptied completely 50cc was instilled into the bladder without difficulty and the patient tolerated well She will refrain from voiding as long as possible  Follow up in 2 weeks, or as patient requests based on her symptom complex

## 2015-03-02 ENCOUNTER — Telehealth: Payer: Self-pay | Admitting: Obstetrics & Gynecology

## 2015-03-02 NOTE — Telephone Encounter (Signed)
Pt states after her bladder irrigation on Friday and severe abdominal and vaginal pain. Pt states she was only able to "hold her bladder after the procedure for 1 hour."  Pt also states does she need a RX for DMSO for her next appt 03/13/15 sent to pharmacy?

## 2015-03-02 NOTE — Telephone Encounter (Signed)
That's ok 1 hour is really good for a lot of folks  Hurting and cramping just underscores the fact that at least in part some of her pain is due to IC/painful bladder syndrome  So yes just keep appt

## 2015-03-13 ENCOUNTER — Encounter: Payer: Self-pay | Admitting: Obstetrics & Gynecology

## 2015-03-13 ENCOUNTER — Ambulatory Visit (INDEPENDENT_AMBULATORY_CARE_PROVIDER_SITE_OTHER): Payer: 59 | Admitting: Obstetrics & Gynecology

## 2015-03-13 VITALS — BP 132/80 | HR 82 | Ht 64.0 in | Wt 256.0 lb

## 2015-03-13 DIAGNOSIS — N3010 Interstitial cystitis (chronic) without hematuria: Secondary | ICD-10-CM | POA: Diagnosis not present

## 2015-03-13 NOTE — Progress Notes (Signed)
Patient ID: Karen Hoover, female   DOB: 01/04/1991, 25 y.o.   MRN: [id number] Pt had more pain after her second DMSO treatment which we discussed in detail Also on her menses today Discussed all the therapy modalities for IC and also how it relates to vestibulitis  She will reschedule from today and look at her schedule for next appt     Face to face time:  15 minutes  Greater than 50% of the visit time was spent in counseling and coordination of care with the patient.  The summary and outline of the counseling and care coordination is summarized in the note above.   All questions were answered.

## 2015-03-17 ENCOUNTER — Ambulatory Visit (INDEPENDENT_AMBULATORY_CARE_PROVIDER_SITE_OTHER): Payer: 59 | Admitting: Adult Health

## 2015-03-17 ENCOUNTER — Other Ambulatory Visit: Payer: BC Managed Care – PPO | Admitting: Adult Health

## 2015-03-17 ENCOUNTER — Encounter: Payer: Self-pay | Admitting: Adult Health

## 2015-03-17 VITALS — BP 120/80 | HR 83 | Ht 65.5 in | Wt 255.0 lb

## 2015-03-17 DIAGNOSIS — Z01419 Encounter for gynecological examination (general) (routine) without abnormal findings: Secondary | ICD-10-CM

## 2015-03-17 DIAGNOSIS — F419 Anxiety disorder, unspecified: Secondary | ICD-10-CM

## 2015-03-17 DIAGNOSIS — N3010 Interstitial cystitis (chronic) without hematuria: Secondary | ICD-10-CM

## 2015-03-17 DIAGNOSIS — Z7689 Persons encountering health services in other specified circumstances: Secondary | ICD-10-CM

## 2015-03-17 HISTORY — DX: Anxiety disorder, unspecified: F41.9

## 2015-03-17 MED ORDER — NORETHIN-ETH ESTRAD-FE BIPHAS 1 MG-10 MCG / 10 MCG PO TABS: 1.0000 | ORAL_TABLET | Freq: Every day | ORAL | Status: DC

## 2015-03-17 MED ORDER — ESCITALOPRAM OXALATE 10 MG PO TABS: 10.0000 mg | ORAL_TABLET | Freq: Every day | ORAL | Status: DC

## 2015-03-17 NOTE — Patient Instructions (Signed)
Interstitial Cystitis Interstitial cystitis is a condition that causes inflammation of the bladder. The bladder is a hollow organ in the lower part of your abdomen. It stores urine after the urine is made by your kidneys. With interstitial cystitis, you may have pain in the bladder area. You may also have a frequent and urgent need to urinate. The severity of interstitial cystitis can vary from person to person. You may have flare-ups of the condition, and then it may go away for a while. For many people who have this condition, it becomes a long-term problem. CAUSES The cause of this condition is not known. RISK FACTORS This condition is more likely to develop in women. SYMPTOMS Symptoms of interstitial cystitis vary, and they can change over time. Symptoms may include:  Discomfort or pain in the bladder area. This can range from mild to severe. The pain may change in intensity as the bladder fills with urine or as it empties.  Pelvic pain.  An urgent need to urinate.  Frequent urination.  Pain during sexual intercourse.  Pinpoint bleeding on the bladder wall. For women, the symptoms often get worse during menstruation. DIAGNOSIS This condition is diagnosed by evaluating your symptoms and ruling out other causes. A physical exam will be done. Various tests may be done to rule out other conditions. Common tests include:  Urine tests.  Cystoscopy. In this test, a tool that is like a very thin telescope is used to look into your bladder.  Biopsy. This involves taking a sample of tissue from the bladder wall to be examined under a microscope. TREATMENT There is no cure for interstitial cystitis, but treatment methods are available to control your symptoms. Work closely with your health care provider to find the treatments that will be most effective for you. Treatment options may include:  Medicines to relieve pain and to help reduce the number of times that you feel the need to  urinate.  Bladder training. This involves learning ways to control when you urinate, such as:  Urinating at scheduled times.  Training yourself to delay urination.  Doing exercises (Kegel exercises) to strengthen the muscles that control urine flow.  Lifestyle changes, such as changing your diet or taking steps to control stress.  Use of a device that provides electrical stimulation in order to reduce pain.  A procedure that stretches your bladder by filling it with air or fluid.  Surgery. This is rare. It is only done for extreme cases if other treatments do not help. HOME CARE INSTRUCTIONS  Take medicines only as directed by your health care provider.  Use bladder training techniques as directed.  Keep a bladder diary to find out which foods, liquids, or activities make your symptoms worse.  Use your bladder diary to schedule bathroom trips. If you are away from home, plan to be near a bathroom at each of your scheduled times.  Make sure you urinate just before you leave the house and just before you go to bed.  Do Kegel exercises as directed by your health care provider.  Do not drink alcohol.  Do not use any tobacco products, including cigarettes, chewing tobacco, or electronic cigarettes. If you need help quitting, ask your health care provider.  Make dietary changes as directed by your health care provider. You may need to avoid spicy foods and foods that contain a high amount of potassium.  Limit your drinking of beverages that stimulate urination. These include soda, coffee, and tea.  Keep all follow-up   visits as directed by your health care provider. This is important. SEEK MEDICAL CARE IF:  Your symptoms do not get better after treatment.  Your pain and discomfort are getting worse.  You have more frequent urges to urinate.  You have a fever. SEEK IMMEDIATE MEDICAL CARE IF:  You are not able to control your bladder at all.   This information is not  intended to replace advice given to you by your health care provider. Make sure you discuss any questions you have with your health care provider.   Document Released: 09/25/2003 Document Revised: 02/14/2014 Document Reviewed: 10/01/2013 Elsevier Interactive Patient Education [employer] Physical  In 1 year, pap2019

## 2015-03-17 NOTE — Progress Notes (Signed)
Patient ID: Karen Hoover, female   DOB: 25-Aug-1990, 25 y.o.   MRN: [id number] History of Present Illness: Karen Hoover is a 25 year old white female, single in for a well woman gyn exam, she had a normal pap 03/13/14.She has IC and had second DMSO and did not think it helped and it hurt, still burns..Has felt stressed, denies depression.Wants OCs refilled, last period heavy but had not had one in a while.   Current Medications, Allergies, Past Medical History, Past Surgical History, Family History and Social History were reviewed in Karen Hoover record.     Review of Systems: Patient denies any headaches, hearing loss, fatigue, blurred vision, shortness of breath, chest pain, abdominal pain, problems with bowel movements, urination, or intercourse(not having sex). No joint pain or mood swings.See HPI for positives.    Physical Exam:BP 120/80 mmHg  Pulse 83  Ht 5' 5.5" (1.664 m)  Wt 255 lb (115.667 kg)  BMI 41.77 kg/m2  LMP 03/11/2015 General:  Well developed, well nourished, no acute distress Skin:  Warm and dry Neck:  Midline trachea, normal thyroid, good ROM, no lymphadenopathy Lungs; Clear to auscultation bilaterally Breast:  No dominant palpable mass, retraction, or nipple discharge Cardiovascular: Regular rate and rhythm Abdomen:  Soft, non tender, no hepatosplenomegaly Pelvic:  External genitalia is normal in appearance, no lesions.  The vagina is normal in appearance. Urethra has no lesions or masses. The cervix is tiny.  Uterus is felt to be normal size, shape, and contour.  No adnexal masses or tenderness noted.Bladder is non tender, no masses felt. Extremities/musculoskeletal:  No swelling or varicosities noted, no clubbing or cyanosis Psych:  No mood changes, alert and cooperative,seems happy Encouraged to give DMSO another try, she says Mom telling her to get second opinion.   Impression: Well woman gyn exam no pap Period management IC  Anxiety      Plan: Take Elmiron 2 bid(had been just taking 1) Refilled lo loestrin x 1 year Rx lexapro 10 mg #30 take 1 daily with 3 refills Follow up in 3 months Physical in 1 year Review handouts on IC

## 2015-03-18 MED FILL — LO LOESTRIN FE 1-10 TABLET: 1 MG-10 MCG | 84 days supply | Qty: 84 | Fill #0

## 2015-03-18 MED FILL — ESCITALOPRAM 10 MG TABLET: 10 | 30 days supply | Qty: 30 | Fill #0

## 2015-03-23 ENCOUNTER — Encounter: Payer: Self-pay | Admitting: Obstetrics & Gynecology

## 2015-03-23 ENCOUNTER — Ambulatory Visit (INDEPENDENT_AMBULATORY_CARE_PROVIDER_SITE_OTHER): Payer: 59 | Admitting: Obstetrics & Gynecology

## 2015-03-23 VITALS — BP 126/72 | HR 72 | Wt 254.0 lb

## 2015-03-23 DIAGNOSIS — N3010 Interstitial cystitis (chronic) without hematuria: Secondary | ICD-10-CM | POA: Diagnosis not present

## 2015-03-23 MED ORDER — PENTOSAN POLYSULFATE SODIUM 100 MG PO CAPS: ORAL_CAPSULE | ORAL | Status: DC

## 2015-03-23 MED FILL — ELMIRON 100 MG CAPSULE: 100 | 30 days supply | Qty: 120 | Fill #0

## 2015-03-23 NOTE — Progress Notes (Signed)
Patient ID: Karen Hoover, female   DOB: 09/22/1990, 25 y.o.   MRN: [id number] Diagnosed with IC: 01/2015  Current Meds:  Elmiron, DMSO Dietary restrictions    Pt states her symptoms have been stable, no exacerbations, although not perfect Wants to continue on this cycle  Blood pressure 126/72, pulse 72, weight 254 lb (115.214 kg), last menstrual period 03/11/2015.    The external urethra meatus was prepped with betadine DMSO 50 cc was instilled in the usual fashion after the bladder was catheterized and emptied completely 50cc was instilled into the bladder without difficulty and the patient tolerated well She will refrain from voiding as long as possible  Follow up in prn weeks, or as patient requests based on her symptom complex

## 2015-04-02 DIAGNOSIS — M5126 Other intervertebral disc displacement, lumbar region: Secondary | ICD-10-CM | POA: Diagnosis not present

## 2015-04-03 ENCOUNTER — Telehealth: Payer: Self-pay | Admitting: *Deleted

## 2015-04-03 NOTE — Telephone Encounter (Signed)
Bring the elmiron and we will supply the DMSO

## 2015-04-06 MED FILL — traMADol HCL 50 MG TABS: 50 | 30 days supply | Qty: 120 | Fill #0

## 2015-04-06 MED FILL — HYDROCODON-APAP 5-325: 5-325 | 15 days supply | Qty: 60 | Fill #0

## 2015-04-06 NOTE — Telephone Encounter (Signed)
Pt informed to bring her Elmiron to next appt, we will supply DMSO per Dr. Despina Hidden. Pt verbalized understanding.

## 2015-04-14 MED FILL — ESCITALOPRAM 10 MG TABLET: 10 | 30 days supply | Qty: 30 | Fill #1

## 2015-04-28 MED FILL — ELMIRON 100 MG CAPSULE: 100 | 30 days supply | Qty: 120 | Fill #1

## 2015-05-12 ENCOUNTER — Ambulatory Visit (INDEPENDENT_AMBULATORY_CARE_PROVIDER_SITE_OTHER): Payer: 59 | Admitting: Obstetrics & Gynecology

## 2015-05-12 ENCOUNTER — Encounter: Payer: Self-pay | Admitting: Obstetrics & Gynecology

## 2015-05-12 VITALS — BP 110/70 | HR 74 | Wt 254.0 lb

## 2015-05-12 DIAGNOSIS — N3010 Interstitial cystitis (chronic) without hematuria: Secondary | ICD-10-CM

## 2015-05-12 MED ORDER — NORETHIN ACE-ETH ESTRAD-FE 1-20 MG-MCG(24) PO TABS: 1.0000 | ORAL_TABLET | Freq: Every day | ORAL | Status: DC

## 2015-05-12 MED FILL — LARIN 24 FE 1 MG-20 MCG TAB: 1-20 | 28 days supply | Qty: 28 | Fill #0

## 2015-05-12 NOTE — Progress Notes (Signed)
Patient ID: Karen Hoover, female   DOB: 02-14-1990, 25 y.o.   MRN: [id number] Diagnosed with IC: 2016  Current Meds:  elmiron dmso Dietary restrictions    Pt states her symptoms have been stable, no exacerbations, although not perfect Wants to continue on this cycle  Blood pressure 110/70, pulse 74, weight 254 lb (115.214 kg), last menstrual period 03/30/2015.    The external urethra meatus was prepped with betadine DMSO 50 cc was instilled in the usual fashion after the bladder was catheterized and emptied completely 50cc was instilled into the bladder without difficulty and the patient tolerated well She will refrain from voiding as long as possible  Follow up in 3 weeks, or as patient requests based on her symptom complex Patient ID: Karen Columbiamily Gomm, female   DOB: 02-14-1990, 25 y.o.   MRN: [id number] Diagnosed with IC: 01/2015  Current Meds:  Elmiron, DMSO Dietary restrictions    Pt states her symptoms have been stable, no exacerbations, although not perfect Wants to continue on this cycle  Blood pressure 110/70, pulse 74, weight 254 lb (115.214 kg), last menstrual period 03/30/2015.    The external urethra meatus was prepped with betadine DMSO 50 cc was instilled in the usual fashion after the bladder was catheterized and emptied completely 50cc was instilled into the bladder without difficulty and the patient tolerated well She will refrain from voiding as long as possible  Follow up in prn weeks, or as patient requests based on her symptom complex  . Meds ordered this encounter  Medications  . Norethindrone Acetate-Ethinyl Estrad-FE (LOESTRIN 24 FE) 1-20 MG-MCG(24) tablet    Sig: Take 1 tablet by mouth daily.    Dispense:  1 Package    Refill:  11

## 2015-05-19 MED FILL — ESCITALOPRAM 10 MG TABLET: 10 | 30 days supply | Qty: 30 | Fill #2

## 2015-05-19 MED FILL — LEVOTHYROXINE 50 MCG TABLET: 50 | 90 days supply | Qty: 90 | Fill #1

## 2015-06-02 ENCOUNTER — Telehealth: Payer: Self-pay | Admitting: Obstetrics & Gynecology

## 2015-06-02 ENCOUNTER — Ambulatory Visit: Payer: 59 | Admitting: Obstetrics & Gynecology

## 2015-06-02 NOTE — Telephone Encounter (Signed)
Pt states she needs to r/s her appt today due to period. Pt states OCP was switched and she has started her period 1 weeks early is this normal. Pt informed can be normal to have BTB anytime going from one birth control to another. Pt also states she has "continuous leakage." Pt informed would need to be seen for the "continious leakage." pt states has an appt 06/15/2015.

## 2015-06-02 NOTE — Telephone Encounter (Signed)
Pt states that she would like a call from Dr. Forestine ChuteEure's nurse. Pt did not state they reason why. Please contact pt

## 2015-06-10 MED FILL — ELMIRON 100 MG CAPSULE: 100 | 30 days supply | Qty: 120 | Fill #2

## 2015-06-10 MED FILL — LARIN 24 FE 1 MG-20 MCG TAB: 1-20 | 84 days supply | Qty: 84 | Fill #1

## 2015-06-15 ENCOUNTER — Ambulatory Visit: Payer: 59 | Admitting: Adult Health

## 2015-06-15 ENCOUNTER — Ambulatory Visit: Payer: 59 | Admitting: Obstetrics & Gynecology

## 2015-06-17 MED FILL — ESCITALOPRAM 10 MG TABLET: 10 | 30 days supply | Qty: 30 | Fill #3

## 2015-06-23 ENCOUNTER — Encounter: Payer: Self-pay | Admitting: Adult Health

## 2015-06-23 ENCOUNTER — Encounter: Payer: Self-pay | Admitting: Obstetrics & Gynecology

## 2015-06-23 ENCOUNTER — Ambulatory Visit (INDEPENDENT_AMBULATORY_CARE_PROVIDER_SITE_OTHER): Payer: 59 | Admitting: Adult Health

## 2015-06-23 ENCOUNTER — Ambulatory Visit (INDEPENDENT_AMBULATORY_CARE_PROVIDER_SITE_OTHER): Payer: 59 | Admitting: Obstetrics & Gynecology

## 2015-06-23 VITALS — BP 110/60 | HR 70 | Ht 64.0 in | Wt 254.0 lb

## 2015-06-23 VITALS — BP 120/80 | HR 88 | Ht 64.0 in | Wt 252.0 lb

## 2015-06-23 DIAGNOSIS — N3010 Interstitial cystitis (chronic) without hematuria: Secondary | ICD-10-CM | POA: Diagnosis not present

## 2015-06-23 DIAGNOSIS — F419 Anxiety disorder, unspecified: Secondary | ICD-10-CM

## 2015-06-23 DIAGNOSIS — N3941 Urge incontinence: Secondary | ICD-10-CM | POA: Diagnosis not present

## 2015-06-23 MED ORDER — MIRABEGRON ER 50 MG PO TB24: 50.0000 mg | ORAL_TABLET | Freq: Every day | ORAL | Status: DC

## 2015-06-23 MED ORDER — ESCITALOPRAM OXALATE 20 MG PO TABS: 20.0000 mg | ORAL_TABLET | Freq: Every day | ORAL | Status: DC

## 2015-06-23 MED FILL — ESCITALOPRAM 20 MG TABLET: 20 | 30 days supply | Qty: 30 | Fill #0

## 2015-06-23 MED FILL — MYRBETRIQ ER 50 MG TABLET: 50 | 30 days supply | Qty: 30 | Fill #0

## 2015-06-23 NOTE — Progress Notes (Signed)
Patient ID: Karen Hoover, female   DOB: 06-18-1990, 25 y.o.   MRN: [id number]   Chief Complaint  Patient presents with  . bladder irrigation   Last Irrigation: 05/12/2015  Diagnosed with IC: 01/22/2015  Current Meds:  Elmiron + DMSO, added myrbetriq 50 mg nightly Dietary restrictions    Pt states her symptoms have been stable, no exacerbations, although not perfect Wants to continue on this cycle  Blood pressure 120/80, pulse 88, height 5\' 4"  (1.626 m), weight 252 lb (114.306 kg), last menstrual period 06/10/2015.    The external urethra meatus was prepped with betadine DMSO 50 cc was instilled in the usual fashion after the bladder was catheterized and emptied completely 50cc was instilled into the bladder without difficulty and the patient tolerated well She will refrain from voiding as long as possible  Follow up in 5 weeks, or as patient requests based on her symptom complex  Meds ordered this encounter  Medications  . mirabegron ER (MYRBETRIQ) 50 MG TB24 tablet    Sig: Take 1 tablet (50 mg total) by mouth daily.    Dispense:  30 tablet    Refill:  11

## 2015-06-23 NOTE — Progress Notes (Signed)
Subjective:     Patient ID: Karen Hoover, female   DOB: 03-19-1990, 25 y.o.   MRN: [id number]  HPI Karen Hoover is a 25 year old white female back in follow up of starting lexapro 3 months ago for anxiety and she is better, but still has times she is anxious and teary, .like last night when back was hurting, has appt with Dr Trey SailorsMark Roy for back.She had bladder irrigation this morning by Dr Despina HiddenEure and has some incontinence.  Review of Systems Patient denies any headaches, hearing loss, fatigue, blurred vision, shortness of breath, chest pain, abdominal pain, problems with bowel movements,or intercourse. No joint pain or mood swings.See HPI for positives.  Reviewed past medical,surgical, social and family history. Reviewed medications and allergies.     Objective:   Physical Exam BP 110/60 mmHg  Pulse 70  Ht 5\' 4"  (1.626 m)  Wt 254 lb (115.214 kg)  BMI 43.58 kg/m2  LMP 06/10/2015 Skin warm and dry. Lungs: clear to ausculation bilaterally. Cardiovascular: regular rate and rhythm.   Discussed that will increase lexapro to 20 mg to see if that is better and she agrees.  Assessment:     Anxiety     Plan:    Will increase lexapro to 20 mg, can take 2 10 mg, rx lexapro 20 mg #30 take 1 daily with 6 refills Follow up in 3 months

## 2015-06-23 NOTE — Patient Instructions (Signed)
Increase lexapro to 20 mg Follow  Up in 3 months

## 2015-06-30 DIAGNOSIS — M5126 Other intervertebral disc displacement, lumbar region: Secondary | ICD-10-CM | POA: Diagnosis not present

## 2015-07-08 ENCOUNTER — Other Ambulatory Visit (HOSPITAL_COMMUNITY): Payer: Self-pay | Admitting: Neurosurgery

## 2015-07-08 DIAGNOSIS — M5126 Other intervertebral disc displacement, lumbar region: Secondary | ICD-10-CM

## 2015-07-13 ENCOUNTER — Ambulatory Visit (HOSPITAL_COMMUNITY): Payer: BC Managed Care – PPO

## 2015-07-21 ENCOUNTER — Ambulatory Visit (HOSPITAL_COMMUNITY)
Admission: RE | Admit: 2015-07-21 | Discharge: 2015-07-21 | Disposition: A | Discharge status: Home / Self Care | Payer: 59 | Source: Ambulatory Visit | Attending: Neurosurgery | Admitting: Neurosurgery

## 2015-07-21 DIAGNOSIS — M5116 Intervertebral disc disorders with radiculopathy, lumbar region: Secondary | ICD-10-CM | POA: Insufficient documentation

## 2015-07-21 DIAGNOSIS — M5126 Other intervertebral disc displacement, lumbar region: Secondary | ICD-10-CM | POA: Insufficient documentation

## 2015-07-21 DIAGNOSIS — M4806 Spinal stenosis, lumbar region: Secondary | ICD-10-CM | POA: Diagnosis not present

## 2015-07-23 DIAGNOSIS — E038 Other specified hypothyroidism: Secondary | ICD-10-CM | POA: Diagnosis not present

## 2015-07-23 DIAGNOSIS — R8299 Other abnormal findings in urine: Secondary | ICD-10-CM | POA: Diagnosis not present

## 2015-07-23 DIAGNOSIS — Z0000 Encounter for general adult medical examination without abnormal findings: Secondary | ICD-10-CM | POA: Diagnosis not present

## 2015-07-24 DIAGNOSIS — M5126 Other intervertebral disc displacement, lumbar region: Secondary | ICD-10-CM | POA: Diagnosis not present

## 2015-07-24 DIAGNOSIS — M5106 Intervertebral disc disorders with myelopathy, lumbar region: Secondary | ICD-10-CM | POA: Diagnosis not present

## 2015-07-24 MED FILL — ELMIRON 100 MG CAPSULE: 100 | 30 days supply | Qty: 120 | Fill #3

## 2015-07-24 MED FILL — HYDROCODON-APAP 5-325: 5-325 | 15 days supply | Qty: 60 | Fill #0

## 2015-07-28 ENCOUNTER — Ambulatory Visit: Payer: 59 | Admitting: Obstetrics & Gynecology

## 2015-07-29 DIAGNOSIS — Z1389 Encounter for screening for other disorder: Secondary | ICD-10-CM | POA: Diagnosis not present

## 2015-07-29 DIAGNOSIS — M4806 Spinal stenosis, lumbar region: Secondary | ICD-10-CM | POA: Diagnosis not present

## 2015-07-29 DIAGNOSIS — Z0000 Encounter for general adult medical examination without abnormal findings: Secondary | ICD-10-CM | POA: Diagnosis not present

## 2015-07-29 DIAGNOSIS — N3010 Interstitial cystitis (chronic) without hematuria: Secondary | ICD-10-CM | POA: Diagnosis not present

## 2015-07-29 DIAGNOSIS — M5126 Other intervertebral disc displacement, lumbar region: Secondary | ICD-10-CM | POA: Diagnosis not present

## 2015-07-29 DIAGNOSIS — M5430 Sciatica, unspecified side: Secondary | ICD-10-CM | POA: Diagnosis not present

## 2015-07-29 DIAGNOSIS — N319 Neuromuscular dysfunction of bladder, unspecified: Secondary | ICD-10-CM | POA: Diagnosis not present

## 2015-07-29 DIAGNOSIS — D509 Iron deficiency anemia, unspecified: Secondary | ICD-10-CM | POA: Diagnosis not present

## 2015-07-29 DIAGNOSIS — Z6838 Body mass index (BMI) 38.0-38.9, adult: Secondary | ICD-10-CM | POA: Diagnosis not present

## 2015-07-29 DIAGNOSIS — M545 Low back pain: Secondary | ICD-10-CM | POA: Diagnosis not present

## 2015-07-29 DIAGNOSIS — E039 Hypothyroidism, unspecified: Secondary | ICD-10-CM | POA: Diagnosis not present

## 2015-07-29 DIAGNOSIS — D72829 Elevated white blood cell count, unspecified: Secondary | ICD-10-CM | POA: Diagnosis not present

## 2015-07-29 DIAGNOSIS — M5136 Other intervertebral disc degeneration, lumbar region: Secondary | ICD-10-CM | POA: Diagnosis not present

## 2015-08-10 MED FILL — ESCITALOPRAM 20 MG TABLET: 20 | 30 days supply | Qty: 30 | Fill #1

## 2015-08-13 ENCOUNTER — Ambulatory Visit (INDEPENDENT_AMBULATORY_CARE_PROVIDER_SITE_OTHER): Payer: 59 | Admitting: Obstetrics & Gynecology

## 2015-08-13 ENCOUNTER — Encounter: Payer: Self-pay | Admitting: Obstetrics & Gynecology

## 2015-08-13 VITALS — BP 100/60 | HR 76 | Wt 149.0 lb

## 2015-08-13 DIAGNOSIS — N3010 Interstitial cystitis (chronic) without hematuria: Secondary | ICD-10-CM | POA: Diagnosis not present

## 2015-08-13 NOTE — Progress Notes (Signed)
Patient ID: Ernestina Columbiamily Seifert, female   DOB: Nov 14, 1990, 25 y.o.   MRN: [id number] Patient ID: Ernestina Columbiamily Belote, female   DOB: Nov 14, 1990, 25 y.o.   MRN: [id number]   Chief Complaint  Patient presents with  . bladder irrigation   Last Irrigation: 05/12/2015  Diagnosed with IC: 01/22/2015  Current Meds:  Elmiron + DMSO, added myrbetriq 50 mg nightly Dietary restrictions    Pt states her symptoms have been stable, no exacerbations, although not perfect Wants to continue on this cycle  Blood pressure 100/60, pulse 76, weight 149 lb (67.586 kg), last menstrual period 08/07/2015.    The external urethra meatus was prepped with betadine DMSO 50 cc was instilled in the usual fashion after the bladder was catheterized and emptied completely 50cc was instilled into the bladder without difficulty and the patient tolerated well She will refrain from voiding as long as possible  Follow up in 6 weeks, or as patient requests based on her symptom complex  No orders of the defined types were placed in this encounter.

## 2015-08-17 MED FILL — ESCITALOPRAM 20 MG TABLET: 20 | 30 days supply | Qty: 30 | Fill #2 | Status: TO

## 2015-08-17 MED FILL — LEVOTHYROXINE 50 MCG TABLET: 50 | 90 days supply | Qty: 90 | Fill #0

## 2015-08-21 ENCOUNTER — Ambulatory Visit (HOSPITAL_COMMUNITY)
Admission: AD | Admit: 2015-08-21 | Discharge: 2015-08-23 | Disposition: A | Discharge status: Home / Self Care | Payer: 59 | Source: Ambulatory Visit | Attending: Neurosurgery | Admitting: Neurosurgery

## 2015-08-21 ENCOUNTER — Encounter (HOSPITAL_COMMUNITY): Payer: Self-pay | Admitting: General Practice

## 2015-08-21 DIAGNOSIS — Y793 Surgical instruments, materials and orthopedic devices (including sutures) associated with adverse incidents: Secondary | ICD-10-CM | POA: Diagnosis not present

## 2015-08-21 DIAGNOSIS — Z9889 Other specified postprocedural states: Secondary | ICD-10-CM | POA: Diagnosis not present

## 2015-08-21 DIAGNOSIS — M4727 Other spondylosis with radiculopathy, lumbosacral region: Secondary | ICD-10-CM | POA: Diagnosis not present

## 2015-08-21 DIAGNOSIS — M5127 Other intervertebral disc displacement, lumbosacral region: Secondary | ICD-10-CM | POA: Diagnosis not present

## 2015-08-21 DIAGNOSIS — M5116 Intervertebral disc disorders with radiculopathy, lumbar region: Secondary | ICD-10-CM | POA: Diagnosis not present

## 2015-08-21 DIAGNOSIS — M549 Dorsalgia, unspecified: Secondary | ICD-10-CM | POA: Insufficient documentation

## 2015-08-21 DIAGNOSIS — M5126 Other intervertebral disc displacement, lumbar region: Secondary | ICD-10-CM | POA: Diagnosis not present

## 2015-08-21 DIAGNOSIS — J810 Acute pulmonary edema: Secondary | ICD-10-CM | POA: Insufficient documentation

## 2015-08-21 DIAGNOSIS — M5117 Intervertebral disc disorders with radiculopathy, lumbosacral region: Secondary | ICD-10-CM | POA: Diagnosis not present

## 2015-08-21 DIAGNOSIS — Y838 Other surgical procedures as the cause of abnormal reaction of the patient, or of later complication, without mention of misadventure at the time of the procedure: Secondary | ICD-10-CM | POA: Diagnosis not present

## 2015-08-21 DIAGNOSIS — J811 Chronic pulmonary edema: Secondary | ICD-10-CM | POA: Diagnosis present

## 2015-08-21 DIAGNOSIS — I9789 Other postprocedural complications and disorders of the circulatory system, not elsewhere classified: Secondary | ICD-10-CM | POA: Insufficient documentation

## 2015-08-21 DIAGNOSIS — F419 Anxiety disorder, unspecified: Secondary | ICD-10-CM | POA: Insufficient documentation

## 2015-08-21 DIAGNOSIS — M4726 Other spondylosis with radiculopathy, lumbar region: Secondary | ICD-10-CM | POA: Diagnosis not present

## 2015-08-21 DIAGNOSIS — R0602 Shortness of breath: Secondary | ICD-10-CM

## 2015-08-21 DIAGNOSIS — R0902 Hypoxemia: Secondary | ICD-10-CM | POA: Diagnosis not present

## 2015-08-21 HISTORY — DX: Other chronic pain: G89.29

## 2015-08-21 HISTORY — DX: Chronic pulmonary edema: J81.1

## 2015-08-21 HISTORY — DX: Low back pain: M54.5

## 2015-08-21 HISTORY — DX: Low back pain, unspecified: M54.50

## 2015-08-21 HISTORY — PX: LAMINECTOMY AND MICRODISCECTOMY LUMBAR SPINE: SHX1913

## 2015-08-21 HISTORY — DX: Hypothyroidism, unspecified: E03.9

## 2015-08-21 MED ORDER — ZOLPIDEM TARTRATE 5 MG PO TABS: 5.0000 mg | ORAL_TABLET | Freq: Every evening | ORAL | Status: DC | PRN

## 2015-08-21 MED ORDER — ACETAMINOPHEN 325 MG PO TABS: 650.0000 mg | ORAL_TABLET | ORAL | Status: DC | PRN

## 2015-08-21 MED ORDER — ESCITALOPRAM OXALATE 10 MG PO TABS
20.0000 mg | ORAL_TABLET | Freq: Every day | ORAL | Status: DC
  Administered 2015-08-22 – 2015-08-23 (×2): 20 mg via ORAL
  Filled 2015-08-21 (×2): qty 2

## 2015-08-21 MED ORDER — DIMETHYL SULFOXIDE 50 % IS SOLN: 50.0000 mL | Freq: Once | INTRAVESICAL | Status: DC

## 2015-08-21 MED ORDER — MENTHOL 3 MG MT LOZG: 1.0000 | LOZENGE | OROMUCOSAL | Status: DC | PRN

## 2015-08-21 MED ORDER — ONDANSETRON HCL 4 MG/2ML IJ SOLN: 4.0000 mg | INTRAMUSCULAR | Status: DC | PRN

## 2015-08-21 MED ORDER — SODIUM CHLORIDE 0.9% FLUSH
3.0000 mL | Freq: Two times a day (BID) | INTRAVENOUS | Status: DC
  Administered 2015-08-21: 3 mL via INTRAVENOUS

## 2015-08-21 MED ORDER — PHENOL 1.4 % MT LIQD: 1.0000 | OROMUCOSAL | Status: DC | PRN

## 2015-08-21 MED ORDER — HYDROMORPHONE HCL 1 MG/ML IJ SOLN: 0.5000 mg | INTRAMUSCULAR | Status: DC | PRN

## 2015-08-21 MED ORDER — ACETAMINOPHEN 650 MG RE SUPP: 650.0000 mg | RECTAL | Status: DC | PRN

## 2015-08-21 MED ORDER — DEXTROSE 5 % IV SOLN
500.0000 mg | Freq: Four times a day (QID) | INTRAVENOUS | Status: DC | PRN
  Filled 2015-08-21: qty 5

## 2015-08-21 MED ORDER — SODIUM CHLORIDE 0.9% FLUSH: 3.0000 mL | INTRAVENOUS | Status: DC | PRN

## 2015-08-21 MED ORDER — HYDROCODONE-ACETAMINOPHEN 10-325 MG PO TABS
1.0000 | ORAL_TABLET | ORAL | Status: DC | PRN
  Administered 2015-08-21: 1 via ORAL
  Administered 2015-08-22: 2 via ORAL
  Administered 2015-08-22 – 2015-08-23 (×2): 1 via ORAL
  Administered 2015-08-23: 2 via ORAL
  Filled 2015-08-21: qty 2
  Filled 2015-08-21: qty 1
  Filled 2015-08-21 (×2): qty 2
  Filled 2015-08-21: qty 1

## 2015-08-21 MED ORDER — FLEET ENEMA 7-19 GM/118ML RE ENEM: 1.0000 | ENEMA | Freq: Once | RECTAL | Status: DC | PRN

## 2015-08-21 MED ORDER — SODIUM CHLORIDE 0.9 % IV SOLN: 250.0000 mL | INTRAVENOUS | Status: DC

## 2015-08-21 MED ORDER — FERROUS SULFATE 325 (65 FE) MG PO TABS
325.0000 mg | ORAL_TABLET | Freq: Every day | ORAL | Status: DC
  Administered 2015-08-22 – 2015-08-23 (×2): 325 mg via ORAL
  Filled 2015-08-21 (×2): qty 1

## 2015-08-21 MED ORDER — CEFAZOLIN IN D5W 1 GM/50ML IV SOLN
1.0000 g | Freq: Three times a day (TID) | INTRAVENOUS | Status: AC
  Administered 2015-08-21 – 2015-08-22 (×2): 1 g via INTRAVENOUS
  Filled 2015-08-21 (×2): qty 50

## 2015-08-21 MED ORDER — LEVOTHYROXINE SODIUM 50 MCG PO TABS
50.0000 ug | ORAL_TABLET | Freq: Every day | ORAL | Status: DC
  Administered 2015-08-22 – 2015-08-23 (×2): 50 ug via ORAL
  Filled 2015-08-21 (×2): qty 1

## 2015-08-21 MED ORDER — PENTOSAN POLYSULFATE SODIUM 100 MG PO CAPS
100.0000 mg | ORAL_CAPSULE | Freq: Two times a day (BID) | ORAL | Status: DC
  Administered 2015-08-21 – 2015-08-23 (×4): 100 mg via ORAL
  Filled 2015-08-21 (×4): qty 1

## 2015-08-21 MED ORDER — SENNA 8.6 MG PO TABS
1.0000 | ORAL_TABLET | Freq: Two times a day (BID) | ORAL | Status: DC
  Administered 2015-08-21 – 2015-08-23 (×3): 8.6 mg via ORAL
  Filled 2015-08-21 (×3): qty 1

## 2015-08-21 MED ORDER — SODIUM CHLORIDE 0.9 % IV SOLN: INTRAVENOUS | Status: DC

## 2015-08-21 MED ORDER — METHOCARBAMOL 500 MG PO TABS: 500.0000 mg | ORAL_TABLET | Freq: Four times a day (QID) | ORAL | Status: DC | PRN

## 2015-08-21 MED ORDER — DOCUSATE SODIUM 100 MG PO CAPS
100.0000 mg | ORAL_CAPSULE | Freq: Two times a day (BID) | ORAL | Status: DC
  Administered 2015-08-21 – 2015-08-23 (×4): 100 mg via ORAL
  Filled 2015-08-21 (×4): qty 1

## 2015-08-21 MED ORDER — BISACODYL 5 MG PO TBEC: 5.0000 mg | DELAYED_RELEASE_TABLET | Freq: Every day | ORAL | Status: DC | PRN

## 2015-08-21 MED ORDER — NORETHIN ACE-ETH ESTRAD-FE 1-20 MG-MCG(24) PO TABS: 1.0000 | ORAL_TABLET | Freq: Every day | ORAL | Status: DC

## 2015-08-21 MED FILL — HYDROCODON-APAP 10-325: 10-325 | 13 days supply | Qty: 80 | Fill #0

## 2015-08-21 MED FILL — tiZANidine HCL 2 MG TABS: 2 | 8 days supply | Qty: 60 | Fill #0

## 2015-08-21 NOTE — H&P (Signed)
Reason for Consult:pulmonary edema postop with need for O2. Referring Physician: Merceda ElksSummers  Karen Hoover is an 25 y.o. female.  HPI: Patient underwent uncomplicated microdiscectomy L45 and L 5 S 1 right earlier [facility].  She did well with surgery, but postop developed some pulmonary edema, which responded to lasix, but patient still required oxygen and was therefore [facility].  Past Medical History  Diagnosis Date  . Anemia   . Thyroid disease   . Vaginal discharge 03/13/2014  . Yeast infection 03/13/2014  . Encounter for menstrual regulation 03/13/2014  . Back pain     2 herniated disc sees Dr Channing Muttersoy  . BV (bacterial vaginosis) 04/10/2014  . Vestibulitis, vulvar 04/21/2014  . Vaginal itching 01/12/2015  . Hemorrhoids 01/12/2015  . History of rectal bleeding 01/12/2015  . IC (interstitial cystitis) 01/22/2015  . Anxiety 03/17/2015    Past Surgical History  Procedure Laterality Date  . No past surgeries      Family History  Problem Relation Age of Onset  . Diabetes Mother   . Thyroid disease Mother   . Cancer Mother     breast  . Thyroid disease Brother   . Diabetes Maternal Grandmother     Social History:  reports that she has never smoked. She has never used smokeless tobacco. She reports that she does not drink alcohol or use illicit drugs.  Allergies: No Known Allergies  Medications: I have reviewed the patient's current medications.  No results found for this or any previous visit (from the past 48 hour(s)).  No results found.  Review of Systems - Negative except as above    Blood pressure 117/65, pulse 109, temperature 98.7 F (37.1 C), temperature source Oral, resp. rate 18, last menstrual period 08/07/2015, SpO2 99 %. Physical Exam  Awake, alert, conversant.  Not SOB.  Minimal complaints of pain.  Back sore, leg no longer painful.  Strength full both lower extremities.  Assessment/Plan: Observe  overnight.  Continue supplemental oxygen.  Hopefully, D/C in AM, otherwise, will consult Pulmonary service.  Dorian HeckleSTERN,Lukka Black D, MD 08/21/2015, 7:38 PM

## 2015-08-21 NOTE — Progress Notes (Signed)
[facility] Room still dirty. Report called 5 minutes prior to pt arriving and told nurse that room was not ready. Pt arrived anyways and is waiting in hallway on 3L continuous cpap. Waiting for room to be cleaned to admit patient. Jillyn HiddenStone,Genessis Flanary R, RN

## 2015-08-22 ENCOUNTER — Ambulatory Visit (HOSPITAL_COMMUNITY): Payer: 59

## 2015-08-22 DIAGNOSIS — F419 Anxiety disorder, unspecified: Secondary | ICD-10-CM | POA: Diagnosis not present

## 2015-08-22 DIAGNOSIS — I9789 Other postprocedural complications and disorders of the circulatory system, not elsewhere classified: Secondary | ICD-10-CM | POA: Diagnosis not present

## 2015-08-22 DIAGNOSIS — R0602 Shortness of breath: Secondary | ICD-10-CM | POA: Diagnosis not present

## 2015-08-22 DIAGNOSIS — J810 Acute pulmonary edema: Secondary | ICD-10-CM | POA: Diagnosis not present

## 2015-08-22 DIAGNOSIS — Z9889 Other specified postprocedural states: Secondary | ICD-10-CM | POA: Diagnosis not present

## 2015-08-22 DIAGNOSIS — M549 Dorsalgia, unspecified: Secondary | ICD-10-CM | POA: Diagnosis not present

## 2015-08-22 LAB — CBC
HCT: 35 % — ABNORMAL LOW (ref 36.0–46.0)
Hemoglobin: 10.9 g/dL — ABNORMAL LOW (ref 12.0–15.0)
MCH: 24.5 pg — AB (ref 26.0–34.0)
MCHC: 31.1 g/dL (ref 30.0–36.0)
MCV: 78.7 fL (ref 78.0–100.0)
PLATELETS: 387 10*3/uL (ref 150–400)
RBC: 4.45 MIL/uL (ref 3.87–5.11)
RDW: 14.6 % (ref 11.5–15.5)
WBC: 20.7 10*3/uL — ABNORMAL HIGH (ref 4.0–10.5)

## 2015-08-22 LAB — BASIC METABOLIC PANEL
Anion gap: 11 (ref 5–15)
BUN: 7 mg/dL (ref 6–20)
CHLORIDE: 102 mmol/L (ref 101–111)
CO2: 25 mmol/L (ref 22–32)
Calcium: 9.1 mg/dL (ref 8.9–10.3)
Creatinine, Ser: 0.58 mg/dL (ref 0.44–1.00)
GFR calc Af Amer: >60 mL/min (ref >60)
GFR calc non Af Amer: >60 mL/min (ref >60)
GLUCOSE: 168 mg/dL — AB (ref 65–99)
POTASSIUM: 3.9 mmol/L (ref 3.5–5.1)
Sodium: 138 mmol/L (ref 135–145)

## 2015-08-22 MED ORDER — FUROSEMIDE 40 MG PO TABS
40.0000 mg | ORAL_TABLET | Freq: Once | ORAL | Status: AC
  Administered 2015-08-22: 40 mg via ORAL
  Filled 2015-08-22: qty 1

## 2015-08-22 NOTE — Progress Notes (Signed)
Patient doing fairly well. Back pain well controlled. No lower extremity pain. Still having a little difficulty with shortness of breath. O2 sats good but remains on oxygen.  Awake and alert. Oriented and appropriate. Chest still with some crackles but otherwise clear. Abdomen soft. Dressing dry.  We'll attempt to wean off oxygen today. If successful patient could go home later today.

## 2015-08-22 NOTE — Discharge Summary (Signed)
Physician Discharge Summary  Patient ID: Karen Hoover MRN: [id number] DOB/AGE: 23/02/1990 24 y.o.  Admit date: 08/21/2015 Discharge date: 08/22/2015  Admission Diagnoses:  Discharge Diagnoses:  Active Problems:   Pulmonary edema   Discharged Condition: Good  Hospital Course: Patient admitted [facility] for evaluation of postoperative pulmonary edema. Symptoms have improved with Lasix and time. Patient weaning off O2. Should be able to go home later today. Back pain stable. No lower extremity pain.  Consults:   Significant Diagnostic Studies:   Treatments:   Discharge Exam: Blood pressure 108/53, pulse 80, temperature 98.3 F (36.8 C), temperature source Oral, resp. rate 20, last menstrual period 08/07/2015, SpO2 98 %. Awake and alert. Oriented and appropriate. Cranial nerve function intact. Motor and sensory function extremities normal. Wound clean and dry. Disposition: Final discharge disposition not confirmed     Medication List    TAKE these medications        escitalopram 20 MG tablet  Commonly known as:  LEXAPRO  Take 1 tablet (20 mg total) by mouth daily.     ferrous sulfate 325 (65 FE) MG tablet  Take 325 mg by mouth daily.     HYDROcodone-acetaminophen 5-325 MG tablet  Commonly known as:  NORCO/VICODIN  Take 1 tablet by mouth every 6 (six) hours as needed. For pain     LARIN 24 FE 1-20 MG-MCG(24) tablet  Generic drug:  Norethindrone Acetate-Ethinyl Estrad-FE  Take 1 tablet by mouth daily.     levothyroxine 50 MCG tablet  Commonly known as:  SYNTHROID, LEVOTHROID  Take 50 mcg by mouth daily.     pentosan polysulfate 100 MG capsule  Commonly known as:  ELMIRON  Take 2 tablets twice daily           Follow-up Information    Follow up with Dorian HeckleSTERN,JOSEPH D, MD.   Specialty:  Neurosurgery   Contact information:   [address] [place] [address] [phone]        Signed: [name],[name] 08/22/2015, 10:39 AM

## 2015-08-22 NOTE — Progress Notes (Signed)
Weaning patient down from O2 via nasal cannula; off O2 for lunch then ambulated down the hall; O2 sats drop to 86 with exertion; ranged from 86-88 with ambulation; returned to room and replaced with 1liter nasal cannula; Sats returned to 96 percent with rest. Will notify MD; continue to monitor.

## 2015-08-23 DIAGNOSIS — Z9889 Other specified postprocedural states: Secondary | ICD-10-CM | POA: Diagnosis not present

## 2015-08-23 DIAGNOSIS — J810 Acute pulmonary edema: Secondary | ICD-10-CM | POA: Diagnosis not present

## 2015-08-23 DIAGNOSIS — F419 Anxiety disorder, unspecified: Secondary | ICD-10-CM | POA: Diagnosis not present

## 2015-08-23 DIAGNOSIS — I9789 Other postprocedural complications and disorders of the circulatory system, not elsewhere classified: Secondary | ICD-10-CM | POA: Diagnosis not present

## 2015-08-23 DIAGNOSIS — M549 Dorsalgia, unspecified: Secondary | ICD-10-CM | POA: Diagnosis not present

## 2015-08-23 MED ORDER — HYDROCODONE-ACETAMINOPHEN 5-325 MG PO TABS: 1.0000 | ORAL_TABLET | Freq: Four times a day (QID) | ORAL | Status: DC | PRN

## 2015-08-23 NOTE — Progress Notes (Signed)
No acute events Sats better D/c

## 2015-08-23 NOTE — Discharge Summary (Signed)
Date of Admission: 08/21/2015  Date of Discharge: 08/23/2015  Admission Diagnosis: Pulmonary edema  Discharge Diagnosis: Same  Procedure Performed: None  Attending: Maeola HarmanJoseph Stern, MD  Hospital Course:  [facility] with pulmonary edema and hypoxia.  She received several doses of lasix and her oxygen saturation improved. She was discharged in stable condition.  Follow up: 3 weeks    Medication List    TAKE these medications        escitalopram 20 MG tablet  Commonly known as:  LEXAPRO  Take 1 tablet (20 mg total) by mouth daily.     ferrous sulfate 325 (65 FE) MG tablet  Take 325 mg by mouth daily.     HYDROcodone-acetaminophen 5-325 MG tablet  Commonly known as:  NORCO/VICODIN  Take 1 tablet by mouth every 6 (six) hours as needed. For pain     LARIN 24 FE 1-20 MG-MCG(24) tablet  Generic drug:  Norethindrone Acetate-Ethinyl Estrad-FE  Take 1 tablet by mouth daily.     levothyroxine 50 MCG tablet  Commonly known as:  SYNTHROID, LEVOTHROID  Take 50 mcg by mouth daily.     pentosan polysulfate 100 MG capsule  Commonly known as:  ELMIRON  Take 2 tablets twice daily

## 2015-08-23 NOTE — Progress Notes (Signed)
Patient ready for discharge to home; room air since previous shift without distress;maintaining O2 sats greater then 94%; discharge instructions given and reviewed; Rx given; family present to accompany patient home;discharged out via wheelchair.

## 2015-08-31 MED FILL — LARIN 24 FE 1 MG-20 MCG TAB: 1-20 | 84 days supply | Qty: 84 | Fill #2

## 2015-09-09 MED FILL — ELMIRON 100 MG CAPSULE: 100 | 30 days supply | Qty: 120 | Fill #4

## 2015-09-09 MED FILL — METHYLPREDNISOLONE 4 MG TAB: 4 | 6 days supply | Qty: 21 | Fill #0

## 2015-09-23 ENCOUNTER — Ambulatory Visit: Payer: 59 | Admitting: Adult Health

## 2015-09-24 ENCOUNTER — Ambulatory Visit (INDEPENDENT_AMBULATORY_CARE_PROVIDER_SITE_OTHER): Payer: 59 | Admitting: Obstetrics & Gynecology

## 2015-09-24 ENCOUNTER — Encounter: Payer: Self-pay | Admitting: Obstetrics & Gynecology

## 2015-09-24 VITALS — BP 110/60 | HR 78 | Wt 256.0 lb

## 2015-09-24 DIAGNOSIS — N3010 Interstitial cystitis (chronic) without hematuria: Secondary | ICD-10-CM

## 2015-09-24 NOTE — Progress Notes (Signed)
Patient ID: Karen Hoover, female   DOB: January 03, 1991, 25 y.o.   MRN: [id number] Patient ID: Karen Hoover, female   DOB: January 03, 1991, [age] y.o.   MRN: [id number]   Chief Complaint  Patient presents with  . bladder irrigation    c/o bladder leakage started back yesterday.   Last Irrigation: 08/13/2015  Diagnosed with IC: 01/22/2015  Current Meds:  Elmiron + DMSO Dietary restrictions    Pt states her symptoms have been stable, no exacerbations, although not perfect Wants to continue on this cycle Had her back surgery 7/15, feeling better  Blood pressure 110/60, pulse 78, weight 256 lb (116.1 kg), last menstrual period 09/01/2015.    The external urethra meatus was prepped with betadine DMSO 50 cc was instilled in the usual fashion after the bladder was catheterized and emptied completely 50cc was instilled into the bladder without difficulty and the patient tolerated well She will refrain from voiding as long as possible  Follow up in 6 weeks, or as patient requests based on her symptom complex  No orders of the defined types were placed in this encounter.

## 2015-10-21 MED FILL — ESCITALOPRAM 20 MG TABLET: 20 | 30 days supply | Qty: 30 | Fill #0 | Status: TO

## 2015-10-21 MED FILL — HYDROCODON-APAP 5-325: 5-325 | 8 days supply | Qty: 30 | Fill #0

## 2015-10-26 ENCOUNTER — Other Ambulatory Visit (HOSPITAL_COMMUNITY): Payer: Self-pay | Admitting: Neurosurgery

## 2015-10-26 DIAGNOSIS — M5416 Radiculopathy, lumbar region: Secondary | ICD-10-CM

## 2015-10-30 ENCOUNTER — Ambulatory Visit (HOSPITAL_COMMUNITY)
Admission: RE | Admit: 2015-10-30 | Discharge: 2015-10-30 | Disposition: A | Discharge status: Home / Self Care | Payer: 59 | Source: Ambulatory Visit | Attending: Neurosurgery | Admitting: Neurosurgery

## 2015-10-30 DIAGNOSIS — M5416 Radiculopathy, lumbar region: Secondary | ICD-10-CM

## 2015-10-30 DIAGNOSIS — M5117 Intervertebral disc disorders with radiculopathy, lumbosacral region: Secondary | ICD-10-CM | POA: Insufficient documentation

## 2015-10-30 DIAGNOSIS — M4806 Spinal stenosis, lumbar region: Secondary | ICD-10-CM | POA: Diagnosis not present

## 2015-10-30 DIAGNOSIS — G9619 Other disorders of meninges, not elsewhere classified: Secondary | ICD-10-CM | POA: Insufficient documentation

## 2015-10-30 DIAGNOSIS — M4687 Other specified inflammatory spondylopathies, lumbosacral region: Secondary | ICD-10-CM | POA: Diagnosis not present

## 2015-10-30 DIAGNOSIS — M5116 Intervertebral disc disorders with radiculopathy, lumbar region: Secondary | ICD-10-CM | POA: Diagnosis not present

## 2015-10-30 DIAGNOSIS — M5126 Other intervertebral disc displacement, lumbar region: Secondary | ICD-10-CM | POA: Diagnosis not present

## 2015-10-30 MED ORDER — GADOBENATE DIMEGLUMINE 529 MG/ML IV SOLN
20.0000 mL | Freq: Once | INTRAVENOUS | Status: AC | PRN
  Administered 2015-10-30: 20 mL via INTRAVENOUS

## 2015-11-06 ENCOUNTER — Encounter (HOSPITAL_COMMUNITY): Payer: Self-pay | Admitting: Emergency Medicine

## 2015-11-06 DIAGNOSIS — R42 Dizziness and giddiness: Secondary | ICD-10-CM | POA: Insufficient documentation

## 2015-11-06 DIAGNOSIS — G44209 Tension-type headache, unspecified, not intractable: Secondary | ICD-10-CM | POA: Insufficient documentation

## 2015-11-06 DIAGNOSIS — E039 Hypothyroidism, unspecified: Secondary | ICD-10-CM | POA: Diagnosis not present

## 2015-11-06 DIAGNOSIS — H81399 Other peripheral vertigo, unspecified ear: Secondary | ICD-10-CM | POA: Diagnosis not present

## 2015-11-06 LAB — BASIC METABOLIC PANEL
ANION GAP: 10 (ref 5–15)
BUN: 6 mg/dL (ref 6–20)
CHLORIDE: 105 mmol/L (ref 101–111)
CO2: 23 mmol/L (ref 22–32)
Calcium: 9.4 mg/dL (ref 8.9–10.3)
Creatinine, Ser: 0.61 mg/dL (ref 0.44–1.00)
GFR calc non Af Amer: >60 mL/min (ref >60)
Glucose, Bld: 165 mg/dL — ABNORMAL HIGH (ref 65–99)
POTASSIUM: 3.4 mmol/L — AB (ref 3.5–5.1)
SODIUM: 138 mmol/L (ref 135–145)

## 2015-11-06 LAB — CBC
HCT: 41.8 % (ref 36.0–46.0)
HEMOGLOBIN: 13.1 g/dL (ref 12.0–15.0)
MCH: 25.2 pg — AB (ref 26.0–34.0)
MCHC: 31.3 g/dL (ref 30.0–36.0)
MCV: 80.5 fL (ref 78.0–100.0)
PLATELETS: 368 10*3/uL (ref 150–400)
RBC: 5.19 MIL/uL — AB (ref 3.87–5.11)
RDW: 14.3 % (ref 11.5–15.5)
WBC: 14.5 10*3/uL — AB (ref 4.0–10.5)

## 2015-11-06 MED ORDER — IBUPROFEN 400 MG PO TABS
400.0000 mg | ORAL_TABLET | Freq: Once | ORAL | Status: AC
  Administered 2015-11-06: 400 mg via ORAL

## 2015-11-06 MED ORDER — IBUPROFEN 400 MG PO TABS
ORAL_TABLET | ORAL | Status: AC
  Filled 2015-11-06: qty 1

## 2015-11-06 NOTE — ED Triage Notes (Signed)
Pt reports "dizzy spells" and headaches sent by PCP to ED. Had [facility] on on 9/[facility], states some kind of fluid on spine but has not seen MD about results.

## 2015-11-07 ENCOUNTER — Emergency Department (HOSPITAL_COMMUNITY)
Admission: EM | Admit: 2015-11-07 | Discharge: 2015-11-07 | Disposition: A | Discharge status: Home / Self Care | Payer: 59 | Attending: Emergency Medicine | Admitting: Emergency Medicine

## 2015-11-07 DIAGNOSIS — G44209 Tension-type headache, unspecified, not intractable: Secondary | ICD-10-CM

## 2015-11-07 DIAGNOSIS — R42 Dizziness and giddiness: Secondary | ICD-10-CM

## 2015-11-07 DIAGNOSIS — H81399 Other peripheral vertigo, unspecified ear: Secondary | ICD-10-CM

## 2015-11-07 DIAGNOSIS — E039 Hypothyroidism, unspecified: Secondary | ICD-10-CM | POA: Diagnosis not present

## 2015-11-07 MED ORDER — KETOROLAC TROMETHAMINE 30 MG/ML IJ SOLN
30.0000 mg | Freq: Once | INTRAMUSCULAR | Status: AC
  Administered 2015-11-07: 30 mg via INTRAVENOUS
  Filled 2015-11-07: qty 1

## 2015-11-07 MED ORDER — MECLIZINE HCL 25 MG PO TABS: 25.0000 mg | ORAL_TABLET | Freq: Three times a day (TID) | ORAL | 0 refills | Status: DC | PRN

## 2015-11-07 MED ORDER — SODIUM CHLORIDE 0.9 % IV BOLUS (SEPSIS)
500.0000 mL | Freq: Once | INTRAVENOUS | Status: AC
  Administered 2015-11-07: 500 mL via INTRAVENOUS

## 2015-11-07 MED ORDER — DEXTROSE 5 % IV SOLN
1000.0000 mg | Freq: Once | INTRAVENOUS | Status: AC
  Administered 2015-11-07: 1000 mg via INTRAVENOUS
  Filled 2015-11-07: qty 10

## 2015-11-07 MED ORDER — MECLIZINE HCL 25 MG PO TABS
25.0000 mg | ORAL_TABLET | Freq: Once | ORAL | Status: AC
Start: 2015-11-07 — End: 2015-11-07
  Administered 2015-11-07: 25 mg via ORAL
  Filled 2015-11-07: qty 1

## 2015-11-07 MED ORDER — METHOCARBAMOL 500 MG PO TABS: 500.0000 mg | ORAL_TABLET | Freq: Three times a day (TID) | ORAL | 0 refills | Status: DC | PRN

## 2015-11-07 NOTE — Discharge Instructions (Signed)
Take Antivert until 24 hours without symptoms.  Robaxin for headaches/muscle tension.  Return to ER with any new or worsening symptoms.

## 2015-11-07 NOTE — ED Provider Notes (Signed)
[facility]-EMERGENCY DEPT Provider Note   CSN: [id number] Arrival date & time: 11/06/15  1927   By signing my name below, I, Clovis PuAvnee Patel, attest that this documentation has been prepared under the direction and in the presence of Rolland PorterMark Ivelise Castillo, MD  Electronically Signed: Clovis PuAvnee Patel, ED Scribe. 11/07/15. 12:32 AM.   History   Chief Complaint Chief Complaint  Patient presents with  . Dizziness    The history is provided by the patient. No language interpreter was used.   HPI Comments:  Karen Hoover is a 25 y.o. female, with a PSHx of back surgery in 08/2015, who presents to the Emergency Department complaining of worsening, intermittent dizziness onset 3 days ago. Associated symptoms include lightheadedness and moderate headaches which began today. She notes she feels like she could pass out but her balance is normal. Pt notes if she moves quickly her symptoms are exacerbated. She denies neck pain,sore throat, fevers, ear pain, ringing in her ears, ear pressure, and weakness to her BUE and BLE. She also denies similar symptoms in the past and being on any new medications. [facility]15 and was told she has fluid around her spine. No alleviating factors noted. No known drug allergies.  Past Medical History:  Diagnosis Date  . Anemia   . Anxiety 03/17/2015  . BV (bacterial vaginosis) 04/10/2014  . Chronic lower back pain    2 herniated disc sees Dr Channing Muttersoy  . Encounter for menstrual regulation 03/13/2014  . Hemorrhoids 01/12/2015  . History of rectal bleeding 01/12/2015  . Hypothyroidism   . IC (interstitial cystitis) 01/22/2015  . Pulmonary edema 08/21/2015   S/P back OR  . Thyroid disease   . Vaginal discharge 03/13/2014  . Vaginal itching 01/12/2015  . Vestibulitis, vulvar 04/21/2014  . Yeast infection 03/13/2014    Patient Active Problem List   Diagnosis Date Noted  . Pulmonary edema 08/21/2015  . Anxiety 03/17/2015  . IC (interstitial cystitis) 01/22/2015  . Vaginal  itching 01/12/2015  . Hemorrhoids 01/12/2015  . History of rectal bleeding 01/12/2015  . Vestibulitis, vulvar 04/21/2014  . BV (bacterial vaginosis) 04/10/2014  . Vaginal discharge 03/13/2014  . Yeast infection 03/13/2014  . Encounter for menstrual regulation 03/13/2014    Past Surgical History:  Procedure Laterality Date  . BACK SURGERY    . LAMINECTOMY AND MICRODISCECTOMY LUMBAR SPINE Right 08/21/2015   L4-5 and L5-S 1    OB History    Gravida Para Term Preterm AB Living   0 0 0 0 0 0   SAB TAB Ectopic Multiple Live Births   0 0 0 0         Home Medications    Prior to Admission medications   Medication Sig Start Date End Date Taking? Authorizing Provider  escitalopram (LEXAPRO) 20 MG tablet Take 1 tablet (20 mg total) by mouth daily. 06/23/15   Adline PotterJennifer A Griffin, NP  ferrous sulfate 325 (65 FE) MG tablet Take 325 mg by mouth daily.    Historical Provider, MD  HYDROcodone-acetaminophen (NORCO/VICODIN) 5-325 MG tablet Take 1 tablet by mouth every 6 (six) hours as needed. For pain 08/23/15   Loura HaltBenjamin Jared Ditty, MD  LARIN 24 FE 1-20 MG-MCG(24) tablet Take 1 tablet by mouth daily.  06/10/15   Historical Provider, MD  levothyroxine (SYNTHROID, LEVOTHROID) 50 MCG tablet Take 50 mcg by mouth daily.  08/09/12   Historical Provider, MD  meclizine (ANTIVERT) 25 MG tablet Take 1 tablet (25 mg total) by mouth 3 (  three) times daily as needed for dizziness. 11/07/15   Rolland Porter, MD  methocarbamol (ROBAXIN) 500 MG tablet Take 1 tablet (500 mg total) by mouth 3 (three) times daily between meals as needed. 11/07/15   Rolland Porter, MD  pentosan polysulfate (ELMIRON) 100 MG capsule Take 2 tablets twice daily Patient taking differently: Take 200 mg by mouth 2 (two) times daily.  03/23/15   Lazaro Arms, MD    Family History Family History  Problem Relation Age of Onset  . Diabetes Mother   . Thyroid disease Mother   . Cancer Mother     breast  . Thyroid disease Brother   . Diabetes Maternal  Grandmother     Social History Social History  Substance Use Topics  . Smoking status: Never Smoker  . Smokeless tobacco: Never Used  . Alcohol use No     Allergies   Review of patient's allergies indicates no known allergies.   Review of Systems Review of Systems  Constitutional: Negative for appetite change, chills, diaphoresis, fatigue and fever.  HENT: Negative for ear pain, mouth sores, sore throat and trouble swallowing.   Eyes: Negative for visual disturbance.  Respiratory: Negative for cough, chest tightness, shortness of breath and wheezing.   Cardiovascular: Negative for chest pain.  Gastrointestinal: Negative for abdominal distention, abdominal pain, diarrhea, nausea and vomiting.  Endocrine: Negative for polydipsia, polyphagia and polyuria.  Genitourinary: Negative for dysuria, frequency and hematuria.  Musculoskeletal: Negative for gait problem and neck pain.  Skin: Negative for color change, pallor and rash.  Neurological: Positive for dizziness, light-headedness and headaches. Negative for syncope.  Hematological: Does not bruise/bleed easily.  Psychiatric/Behavioral: Negative for behavioral problems and confusion.     Physical Exam Updated Vital Signs BP 119/70   Pulse 82   Temp 98.5 F (36.9 C) (Oral)   Resp 23   LMP 10/28/2015 (Within Days)   SpO2 100%   Physical Exam  Constitutional: She is oriented to person, place, and time. She appears well-developed and well-nourished. No distress.  Flat affect. Symptoms reproduced with sitting upright.  HENT:  Head: Normocephalic.  Eyes: Conjunctivae are normal. Pupils are equal, round, and reactive to light. No scleral icterus.  No nystagmus.  Neck: Normal range of motion. Neck supple. No thyromegaly present.  Cardiovascular: Normal rate and regular rhythm.  Exam reveals no gallop and no friction rub.   No murmur heard. Pulmonary/Chest: Effort normal and breath sounds normal. No respiratory distress. She  has no wheezes. She has no rales.  Abdominal: Soft. Bowel sounds are normal. She exhibits no distension. There is no tenderness. There is no rebound.  Musculoskeletal: Normal range of motion.  Neurological: She is alert and oriented to person, place, and time.  Skin: Skin is warm and dry. No rash noted.  Psychiatric: She has a normal mood and affect. Her behavior is normal.     ED Treatments / Results  DIAGNOSTIC STUDIES:  Oxygen Saturation is 100% on RA, normal by my interpretation.    COORDINATION OF CARE:  12:27 AM Discussed treatment plan with pt at bedside and pt agreed to plan.  Labs (all labs ordered are listed, but only abnormal results are displayed) Labs Reviewed  BASIC METABOLIC PANEL - Abnormal; Notable for the following:       Result Value   Potassium 3.4 (*)    Glucose, Bld 165 (*)    All other components within normal limits  CBC - Abnormal; Notable for the following:  WBC 14.5 (*)    RBC 5.19 (*)    MCH 25.2 (*)    All other components within normal limits  CBG MONITORING, ED    EKG  EKG Interpretation None       Radiology No results found.  Procedures Procedures (including critical care time)  Medications Ordered in ED Medications  ibuprofen (ADVIL,MOTRIN) 400 MG tablet (not administered)  ibuprofen (ADVIL,MOTRIN) tablet 400 mg (400 mg Oral Given 11/06/15 1937)  meclizine (ANTIVERT) tablet 25 mg (25 mg Oral Given 11/07/15 0147)  ketorolac (TORADOL) 30 MG/ML injection 30 mg (30 mg Intravenous Given 11/07/15 0149)  methocarbamol (ROBAXIN) 1,000 mg in dextrose 5 % 50 mL IVPB (0 mg Intravenous Stopped 11/07/15 0301)  sodium chloride 0.9 % bolus 500 mL (0 mLs Intravenous Stopped 11/07/15 0301)     Initial Impression / Assessment and Plan / ED Course  I have reviewed the triage vital signs and the nursing notes.  Pertinent labs & imaging results that were available during my care of the patient were reviewed by me and considered in my medical  decision making (see chart for details).  Clinical Course    Doubt CSF leak--exam, not positinsl  Final Clinical Impressions(s) / ED Diagnoses   Final diagnoses:  Dizziness  Peripheral vertigo, unspecified laterality  Muscle tension headache    Patient had relief of symptoms after medications. She politely declines CT scan. I have no significant concern about hemorrhage or tumor based on her otherwise normal exam and I think this is very likely peripheral vertigo and perhaps maybe even cervical vertigo. Plan will be Antivert until 24 hours without symptoms. Robaxin. Primary care follow-up.   New Prescriptions New Prescriptions   MECLIZINE (ANTIVERT) 25 MG TABLET    Take 1 tablet (25 mg total) by mouth 3 (three) times daily as needed for dizziness.   METHOCARBAMOL (ROBAXIN) 500 MG TABLET    Take 1 tablet (500 mg total) by mouth 3 (three) times daily between meals as needed.  I personally performed the services described in this documentation, which was scribed in my presence. The recorded information has been reviewed and is accurate.     Rolland Porter, MD 11/07/15 [phone]

## 2015-11-09 ENCOUNTER — Ambulatory Visit: Payer: 59 | Admitting: Obstetrics & Gynecology

## 2015-11-11 MED FILL — IBUPROFEN 800 MG TABLET: 800 | 22 days supply | Qty: 45 | Fill #0

## 2015-11-11 MED FILL — HYDROCODON-APAP 5-325: 5-325 | 20 days supply | Qty: 60 | Fill #0

## 2015-11-27 MED FILL — LEVOTHYROXINE 50 MCG TABLET: 50 | 90 days supply | Qty: 90 | Fill #1

## 2015-11-27 MED FILL — ESCITALOPRAM 20 MG TABLET: 20 | 30 days supply | Qty: 30 | Fill #0

## 2015-11-27 MED FILL — ELMIRON 100 MG CAPSULE: 100 | 30 days supply | Qty: 120 | Fill #5

## 2015-11-27 MED FILL — LARIN 24 FE 1 MG-20 MCG TAB: 1-20 | 84 days supply | Qty: 84 | Fill #3

## 2015-12-01 ENCOUNTER — Telehealth: Payer: Self-pay | Admitting: Obstetrics & Gynecology

## 2015-12-01 NOTE — Telephone Encounter (Signed)
Sure she is welcome to see the folks at [facility] Some of them deal with IC patients, some don't I think  Arrange a referral and let pt know  Thanks

## 2015-12-02 NOTE — Telephone Encounter (Signed)
Referral completed to [facility]. Pt aware.

## 2015-12-03 DIAGNOSIS — M545 Low back pain: Secondary | ICD-10-CM | POA: Diagnosis not present

## 2015-12-03 DIAGNOSIS — M5416 Radiculopathy, lumbar region: Secondary | ICD-10-CM | POA: Diagnosis not present

## 2015-12-04 DIAGNOSIS — R195 Other fecal abnormalities: Secondary | ICD-10-CM | POA: Diagnosis not present

## 2015-12-04 DIAGNOSIS — R197 Diarrhea, unspecified: Secondary | ICD-10-CM | POA: Diagnosis not present

## 2015-12-04 DIAGNOSIS — Z6839 Body mass index (BMI) 39.0-39.9, adult: Secondary | ICD-10-CM | POA: Diagnosis not present

## 2015-12-04 DIAGNOSIS — R1111 Vomiting without nausea: Secondary | ICD-10-CM | POA: Diagnosis not present

## 2015-12-04 DIAGNOSIS — R1084 Generalized abdominal pain: Secondary | ICD-10-CM | POA: Diagnosis not present

## 2015-12-07 DIAGNOSIS — R197 Diarrhea, unspecified: Secondary | ICD-10-CM | POA: Diagnosis not present

## 2015-12-16 ENCOUNTER — Encounter: Payer: Self-pay | Admitting: Gastroenterology

## 2015-12-16 ENCOUNTER — Other Ambulatory Visit (INDEPENDENT_AMBULATORY_CARE_PROVIDER_SITE_OTHER): Payer: 59

## 2015-12-16 ENCOUNTER — Ambulatory Visit (INDEPENDENT_AMBULATORY_CARE_PROVIDER_SITE_OTHER): Payer: 59 | Admitting: Gastroenterology

## 2015-12-16 VITALS — BP 130/84 | HR 90 | Ht 65.0 in | Wt 262.0 lb

## 2015-12-16 DIAGNOSIS — R194 Change in bowel habit: Secondary | ICD-10-CM

## 2015-12-16 DIAGNOSIS — R1084 Generalized abdominal pain: Secondary | ICD-10-CM

## 2015-12-16 DIAGNOSIS — R197 Diarrhea, unspecified: Secondary | ICD-10-CM

## 2015-12-16 DIAGNOSIS — K625 Hemorrhage of anus and rectum: Secondary | ICD-10-CM

## 2015-12-16 LAB — CBC WITH DIFFERENTIAL/PLATELET
BASOS ABS: 0.1 10*3/uL (ref 0.0–0.1)
Basophils Relative: 0.6 % (ref 0.0–3.0)
Eosinophils Absolute: 0.3 10*3/uL (ref 0.0–0.7)
Eosinophils Relative: 1.6 % (ref 0.0–5.0)
HCT: 38 % (ref 36.0–46.0)
Hemoglobin: 12.6 g/dL (ref 12.0–15.0)
LYMPHS ABS: 3.3 10*3/uL (ref 0.7–4.0)
Lymphocytes Relative: 20.8 % (ref 12.0–46.0)
MCHC: 33 g/dL (ref 30.0–36.0)
MCV: 76.6 fl — ABNORMAL LOW (ref 78.0–100.0)
MONO ABS: 0.8 10*3/uL (ref 0.1–1.0)
Monocytes Relative: 5.2 % (ref 3.0–12.0)
NEUTROS PCT: 71.8 % (ref 43.0–77.0)
Neutro Abs: 11.2 10*3/uL — ABNORMAL HIGH (ref 1.4–7.7)
Platelets: 406 10*3/uL — ABNORMAL HIGH (ref 150.0–400.0)
RBC: 4.96 Mil/uL (ref 3.87–5.11)
RDW: 14.5 % (ref 11.5–15.5)
WBC: 15.7 10*3/uL — AB (ref 4.0–10.5)

## 2015-12-16 LAB — IGA: IGA: 224 mg/dL (ref 68–378)

## 2015-12-16 MED ORDER — NA SULFATE-K SULFATE-MG SULF 17.5-3.13-1.6 GM/177ML PO SOLN: 1.0000 | Freq: Once | ORAL | 0 refills | Status: AC

## 2015-12-16 MED FILL — SUPREP BOWEL PREP KIT: 17.5-3.13-1 | 1 days supply | Qty: 354 | Fill #0

## 2015-12-16 NOTE — Patient Instructions (Signed)
Your physician has requested that you go to the basement for lab work before leaving today.  You have been scheduled for a colonoscopy. Please follow written instructions given to you at your visit today.  Please pick up your prep supplies at the pharmacy within the next 1-3 days. If you use inhalers (even only as needed), please bring them with you on the day of your procedure. Your physician has requested that you go to www.startemmi.com and enter the access code given to you at your visit today. This web site gives a general overview about your procedure. However, you should still follow specific instructions given to you by our office regarding your preparation for the procedure.   

## 2015-12-16 NOTE — Progress Notes (Signed)
12/16/2015 Karen Hoover [id number] 14-Sep-1990   HISTORY OF PRESENT ILLNESS:  This is a 25 year old female who is new to our practice and was referred here by her PCP, Dr. Ardeth Perfect, for evaluation of persistent diarrhea.  She reports diarrhea 4-5 times per day, with need to move her bowels after every time she eats. She says that on occasion she will have a formed stool, but is mostly diarrhea. She also reports diffuse generalized abdominal pain. She has seen some blood in her stool on occasion as well, sometimes bright red and sometimes with darker blood and clots. All this just began 3 months ago. She says that prior to that she was moving her bowels regularly, once a day with formed stools. She tells me that her abdominal pain is constant on a daily basis. She describes it as cramping. She tells me that she has tried to avoid certain foods such as fried foods, etc., but it did not seem to make any difference. She is on thyroid medication and she tells me that her thyroid levels were within normal limits when they were checked in May of this year.  Denies nocturnal symptoms.  Stool studies for ova and parasites, C. difficile, and stool culture were all negative. She was checked for H. pylori as well via labs, which was negative. Hemoglobin and platelet counts are normal as well as a CMP. She has not tried anything as far as Imodium, etc. for her diarrhea.  Of note, her white blood cell count was very elevated in the past. She tells me that her most recent labs in October were drawn just a couple of days after having a steroid injection.  Past Medical History:  Diagnosis Date  . Anemia   . Anxiety 03/17/2015  . BV (bacterial vaginosis) 04/10/2014  . Chronic lower back pain    2 herniated disc sees Dr Carloyn Manner  . Encounter for menstrual regulation 03/13/2014  . Hemorrhoids 01/12/2015  . History of rectal bleeding 01/12/2015  . Hypothyroidism   . IC (interstitial cystitis) 01/22/2015  . Pulmonary  edema 08/21/2015   S/P back OR  . Thyroid disease   . Vaginal discharge 03/13/2014  . Vaginal itching 01/12/2015  . Vestibulitis, vulvar 04/21/2014  . Yeast infection 03/13/2014   Past Surgical History:  Procedure Laterality Date  . BACK SURGERY    . LAMINECTOMY AND MICRODISCECTOMY LUMBAR SPINE Right 08/21/2015   L4-5 and L5-S 1    reports that she has never smoked. She has never used smokeless tobacco. She reports that she does not drink alcohol or use drugs. family history includes Cancer in her mother; Diabetes in her maternal grandmother and mother; Thyroid disease in her brother and mother. No Known Allergies    Outpatient Encounter Prescriptions as of 12/16/2015  Medication Sig  . escitalopram (LEXAPRO) 20 MG tablet Take 1 tablet (20 mg total) by mouth daily.  . ferrous sulfate 325 (65 FE) MG tablet Take 325 mg by mouth daily.  Marland Kitchen HYDROcodone-acetaminophen (NORCO/VICODIN) 5-325 MG tablet Take 1 tablet by mouth every 6 (six) hours as needed. For pain  . LARIN 24 FE 1-20 MG-MCG(24) tablet Take 1 tablet by mouth daily.   Marland Kitchen levothyroxine (SYNTHROID, LEVOTHROID) 50 MCG tablet Take 50 mcg by mouth daily.   . meclizine (ANTIVERT) 25 MG tablet Take 1 tablet (25 mg total) by mouth 3 (three) times daily as needed for dizziness.  . methocarbamol (ROBAXIN) 500 MG tablet Take 1 tablet (500 mg total) by mouth  3 (three) times daily between meals as needed.  Marland Kitchen omeprazole (PRILOSEC) 20 MG capsule Take 20 mg by mouth daily.  . pentosan polysulfate (ELMIRON) 100 MG capsule Take 2 tablets twice daily (Patient taking differently: Take 200 mg by mouth 2 (two) times daily. )  . Na Sulfate-K Sulfate-Mg Sulf 17.5-3.13-1.6 GM/180ML SOLN Take 1 kit by mouth once.   No facility-administered encounter medications on file as of 12/16/2015.      REVIEW OF SYSTEMS  : All other systems reviewed and negative except where noted in the History of Present Illness.   PHYSICAL EXAM: BP 130/84   Pulse 90   Ht _0   (1.651 m)   Wt 262 lb (118.8 kg)   LMP 12/06/2015   BMI 43.60 kg/m  General: Well developed white female in no acute distress Head: Normocephalic and atraumatic Eyes:  Sclerae anicteric, conjunctiva pink. Ears: Normal auditory acuity Lungs: Clear throughout to auscultation Heart: Regular rate and rhythm Abdomen: Soft, non-distended.  Normal bowel sounds.  Diffuse TTP. Rectal:  Will be done at the time of colonoscopy. Musculoskeletal: Symmetrical with no gross deformities  Skin: No lesions on visible extremities Extremities: No edema  Neurological: Alert oriented x 4, grossly non-focal Psychological:  Alert and cooperative. Normal mood and affect  ASSESSMENT AND PLAN: -25 year old with diarrhea for the past 3 months (now considered chronic at this point), which is a change in her bowel habits.  Stool studies negative. TSH within normal limits. Also reports diffuse generalized abdominal pain and rectal bleeding.  Question if this is all just irritable bowel and hemorrhoidal bleeding. We will check celiac labs, but I think that next step in evaluation is colonoscopy to rule out IBD, etc.  Will schedule with Dr. Fuller Plan. -Leukocytosis:  Says that she had received a steroid injection just prior to her last labs. We will repeat a CBC today to be sure that her white blood cell counts are trending down.  *The risks, benefits, and alternatives to colonoscopy were discussed with the patient and she consents to proceed.   CC:  Velna Hatchet, MD

## 2015-12-16 NOTE — Progress Notes (Signed)
Reviewed and agree with management plan.  Canon Gola T. Wilbern Pennypacker, MD FACG 

## 2015-12-17 LAB — TISSUE TRANSGLUTAMINASE, IGA: TISSUE TRANSGLUTAMINASE AB, IGA: 1 U/mL (ref <4)

## 2015-12-21 DIAGNOSIS — I10 Essential (primary) hypertension: Secondary | ICD-10-CM | POA: Diagnosis not present

## 2015-12-21 DIAGNOSIS — M5416 Radiculopathy, lumbar region: Secondary | ICD-10-CM | POA: Diagnosis not present

## 2015-12-21 DIAGNOSIS — M5126 Other intervertebral disc displacement, lumbar region: Secondary | ICD-10-CM | POA: Diagnosis not present

## 2015-12-21 DIAGNOSIS — M545 Low back pain: Secondary | ICD-10-CM | POA: Diagnosis not present

## 2015-12-21 DIAGNOSIS — Z6841 Body Mass Index (BMI) 40.0 and over, adult: Secondary | ICD-10-CM | POA: Diagnosis not present

## 2015-12-21 DIAGNOSIS — M48062 Spinal stenosis, lumbar region with neurogenic claudication: Secondary | ICD-10-CM | POA: Diagnosis not present

## 2015-12-21 MED FILL — IBUPROFEN 400 MG TABLET: 400 | 30 days supply | Qty: 90 | Fill #0

## 2015-12-21 MED FILL — HYDROCODON-APAP 5-325: 5-325 | 20 days supply | Qty: 60 | Fill #0

## 2015-12-22 ENCOUNTER — Ambulatory Visit: Payer: 59 | Admitting: Obstetrics & Gynecology

## 2016-01-07 MED FILL — ESCITALOPRAM 20 MG TABLET: 20 | 30 days supply | Qty: 30 | Fill #1

## 2016-01-21 ENCOUNTER — Ambulatory Visit (AMBULATORY_SURGERY_CENTER): Payer: 59 | Admitting: Gastroenterology

## 2016-01-21 ENCOUNTER — Encounter: Payer: Self-pay | Admitting: Gastroenterology

## 2016-01-21 VITALS — BP 95/59 | HR 91 | Temp 97.8°F | Resp 17 | Ht 65.0 in | Wt 262.0 lb

## 2016-01-21 DIAGNOSIS — K921 Melena: Secondary | ICD-10-CM | POA: Diagnosis not present

## 2016-01-21 DIAGNOSIS — R197 Diarrhea, unspecified: Secondary | ICD-10-CM

## 2016-01-21 MED ORDER — SODIUM CHLORIDE 0.9 % IV SOLN: 500.0000 mL | INTRAVENOUS | Status: DC

## 2016-01-21 MED ORDER — DICYCLOMINE HCL 10 MG PO CAPS: 10.0000 mg | ORAL_CAPSULE | Freq: Three times a day (TID) | ORAL | 11 refills | Status: DC

## 2016-01-21 MED FILL — DICYCLOMINE 10 MG CAPSULE: 10 | 30 days supply | Qty: 120 | Fill #0

## 2016-01-21 NOTE — Op Note (Signed)
[facility] Patient Name: Karen Hoover Procedure Date: 01/21/2016 3:08 PM MRN: [id number] Endoscopist: Meryl DareMalcolm T Stark , MD Age: 25 Referring MD:  Date of Birth: 03/17/1990 Gender: Female Account #: [id number] Procedure:                Colonoscopy Indications:              Clinically significant diarrhea of unexplained                            origin, Hematochezia Medicines:                Monitored Anesthesia Care Procedure:                Pre-Anesthesia Assessment:                           - Prior to the procedure, a History and Physical                            was performed, and patient medications and                            allergies were reviewed. The patient's tolerance of                            previous anesthesia was also reviewed. The risks                            and benefits of the procedure and the sedation                            options and risks were discussed with the patient.                            All questions were answered, and informed consent                            was obtained. Prior Anticoagulants: The patient has                            taken no previous anticoagulant or antiplatelet                            agents. ASA Grade Assessment: II - A patient with                            mild systemic disease. After reviewing the risks                            and benefits, the patient was deemed in                            satisfactory condition to undergo the procedure.  After obtaining informed consent, the colonoscope                            was passed under direct vision. Throughout the                            procedure, the patient's blood pressure, pulse, and                            oxygen saturations were monitored continuously. The                            Model CF-HQ190L [id number]) scope was introduced                            through the anus and advanced to the  the terminal                            ileum. The terminal ileum, ileocecal valve,                            appendiceal orifice, and rectum were photographed.                            The quality of the bowel preparation was excellent.                            The colonoscopy was performed without difficulty.                            The patient tolerated the procedure well. Scope In: 3:15:34 PM Scope Out: 3:23:45 PM Scope Withdrawal Time: 0 hours 6 minutes 25 seconds  Total Procedure Duration: 0 hours 8 minutes 11 seconds  Findings:                 The perianal and digital rectal examinations were                            normal.                           The entire examined colon appeared normal on direct                            and retroflexion views. Biopsies for histology were                            taken with a cold forceps from the entire colon for                            evaluation of microscopic colitis.                           The terminal ileum appeared normal. Complications:  No immediate complications. Estimated blood loss:                            None. Estimated Blood Loss:     Estimated blood loss: none. Impression:               - The entire examined colon is normal on direct and                            retroflexion views.                           - The examined portion of the ileum was normal. Recommendation:           - Patient has a contact number available for                            emergencies. The signs and symptoms of potential                            delayed complications were discussed with the                            patient. Return to normal activities tomorrow.                            Written discharge instructions were provided to the                            patient.                           - 7 days of lactose free diet, if not effective 10                            days of low FODMAP diet.                            - Continue present medications.                           - Await pathology results.                           - Bentyl (dicyclomine) 10 mg PO QID 30 min ac and                            hs, 1 year of refills.                           - No recommendation at this time regarding repeat                            colonoscopy due to young age. Meryl Dare, MD 01/21/2016 3:33:12 PM This report has been signed electronically.

## 2016-01-21 NOTE — Progress Notes (Signed)
Called to room to assist during endoscopic procedure.  Patient ID and intended procedure confirmed with present staff. Received instructions for my participation in the procedure from the performing physician.  

## 2016-01-21 NOTE — Progress Notes (Signed)
A/ox3 pleased with MAC, report to Michelle RN 

## 2016-01-21 NOTE — Patient Instructions (Signed)
YOU HAD AN ENDOSCOPIC PROCEDURE TODAY AT THE [facility]:   Refer to the procedure report that was given to you for any specific questions about what was found during the examination.  If the procedure report does not answer your questions, please call your gastroenterologist to clarify.  If you requested that your care partner not be given the details of your procedure findings, then the procedure report has been included in a sealed envelope for you to review at your convenience later.  YOU SHOULD EXPECT: Some feelings of bloating in the abdomen. Passage of more gas than usual.  Walking can help get rid of the air that was put into your GI tract during the procedure and reduce the bloating. If you had a lower endoscopy (such as a colonoscopy or flexible sigmoidoscopy) you may notice spotting of blood in your stool or on the toilet paper. If you underwent a bowel prep for your procedure, you may not have a normal bowel movement for a few days.  Please Note:  You might notice some irritation and congestion in your nose or some drainage.  This is from the oxygen used during your procedure.  There is no need for concern and it should clear up in a day or so.  SYMPTOMS TO REPORT IMMEDIATELY:   Following lower endoscopy (colonoscopy or flexible sigmoidoscopy):  Excessive amounts of blood in the stool  Significant tenderness or worsening of abdominal pains  Swelling of the abdomen that is new, acute  Fever of 100F or higher  For urgent or emergent issues, a gastroenterologist can be reached at any hour by calling [phone].   DIET:  We do recommend a small meal at first, but then you may proceed to your regular diet.  Drink plenty of fluids but you should avoid alcoholic beverages for 24 hours.  ACTIVITY:  You should plan to take it easy for the rest of today and you should NOT DRIVE or use heavy machinery until tomorrow (because of the sedation medicines used during the test).     FOLLOW UP: Our staff will call the number listed on your records the next business day following your procedure to check on you and address any questions or concerns that you may have regarding the information given to you following your procedure. If we do not reach you, we will leave a message.  However, if you are feeling well and you are not experiencing any problems, there is no need to return our call.  We will assume that you have returned to your regular daily activities without incident.  If any biopsies were taken you will be contacted by phone or by letter within the next 1-3 weeks.  Please call us at [phone] if you have not heard about the biopsies in 3 weeks.    SIGNATURES/CONFIDENTIALITY: You and/or your care partner have signed paperwork which will be entered into your electronic medical record.  These signatures attest to the fact that that the information above on your After Visit Summary has been reviewed and is understood.  Full responsibility of the confidentiality of this discharge information lies with you and/or your care-partner.  7 days of lactose free diet, if not effective 10 days of low FODMAP diet.(handout given) Await pathology results Bentyl (dicyclomine) 10 mg by mouth four times a day 30 minutes before meals and at bedtime

## 2016-01-22 ENCOUNTER — Telehealth: Payer: Self-pay

## 2016-01-22 NOTE — Telephone Encounter (Signed)
  Follow up Call-  Call back number 01/21/2016  Post procedure Call Back phone  # [phone]  Permission to leave phone message Yes  Some recent data might be hidden     Patient questions:  Do you have a fever, pain , or abdominal swelling? No. Pain Score  0 *  Have you tolerated food without any problems? Yes.    Have you been able to return to your normal activities? Yes.    Do you have any questions about your discharge instructions: Diet   No. Medications  No. Follow up visit  No.  Do you have questions or concerns about your Care? No.  Actions: * If pain score is 4 or above: No action needed, pain <4.

## 2016-01-27 MED FILL — ELMIRON 100 MG CAPSULE: 100 | 30 days supply | Qty: 120 | Fill #6

## 2016-02-04 ENCOUNTER — Encounter: Payer: Self-pay | Admitting: Gastroenterology

## 2016-02-12 DIAGNOSIS — R32 Unspecified urinary incontinence: Secondary | ICD-10-CM | POA: Diagnosis not present

## 2016-02-12 DIAGNOSIS — N3010 Interstitial cystitis (chronic) without hematuria: Secondary | ICD-10-CM | POA: Diagnosis not present

## 2016-02-12 MED FILL — ESCITALOPRAM 20 MG TABLET: 20 | 30 days supply | Qty: 30 | Fill #2

## 2016-02-19 DIAGNOSIS — N281 Cyst of kidney, acquired: Secondary | ICD-10-CM | POA: Diagnosis not present

## 2016-02-19 DIAGNOSIS — Z6839 Body mass index (BMI) 39.0-39.9, adult: Secondary | ICD-10-CM | POA: Diagnosis not present

## 2016-02-19 DIAGNOSIS — J019 Acute sinusitis, unspecified: Secondary | ICD-10-CM | POA: Diagnosis not present

## 2016-02-19 DIAGNOSIS — R05 Cough: Secondary | ICD-10-CM | POA: Diagnosis not present

## 2016-02-19 MED FILL — LEVOTHYROXINE 50 MCG TABLET: 50 | 90 days supply | Qty: 90 | Fill #2

## 2016-02-19 MED FILL — PROMETHAZINE-CODEINE SYRUP: 6.25-10 | 6 days supply | Qty: 120 | Fill #0

## 2016-02-19 MED FILL — predniSONE 5 MG (21) TBPK: 5 | 6 days supply | Qty: 21 | Fill #0

## 2016-02-19 MED FILL — FLUTICASONE PROP 50 MCG SPR: 50 | 30 days supply | Qty: 16 | Fill #0

## 2016-02-23 MED FILL — JUNEL FE 24 TABLET: 1-20 | 56 days supply | Qty: 56 | Fill #4

## 2016-03-02 DIAGNOSIS — R32 Unspecified urinary incontinence: Secondary | ICD-10-CM | POA: Diagnosis not present

## 2016-03-07 ENCOUNTER — Telehealth: Payer: Self-pay | Admitting: Obstetrics & Gynecology

## 2016-03-07 NOTE — Telephone Encounter (Signed)
Pt called stating that she is needing a refill of her birth control medication. Please contact pt

## 2016-03-08 ENCOUNTER — Telehealth: Payer: Self-pay | Admitting: *Deleted

## 2016-03-09 ENCOUNTER — Telehealth: Payer: Self-pay | Admitting: *Deleted

## 2016-03-10 NOTE — Telephone Encounter (Signed)
There is no alternative, it is a one of a kind drug 

## 2016-03-10 NOTE — Telephone Encounter (Signed)
Informed patient that current medication is one of a kind and there is no other alternative. Pt stated she still had a few and would finish those and may not be able to take anymore and will be in pain. Patient has not been seen since October so I advised patient to keep her appointment so that she can be evaluated again. Pt verbalized understanding.

## 2016-03-10 NOTE — Telephone Encounter (Signed)
Spoke with pt. Pt has been on Elmiron. It's to expensive. Can you prescribe something similar that's cheaper? Pt was transferred to front desk to schedule an appt for yearly. JSY

## 2016-03-11 DIAGNOSIS — R32 Unspecified urinary incontinence: Secondary | ICD-10-CM | POA: Diagnosis not present

## 2016-03-11 DIAGNOSIS — N3010 Interstitial cystitis (chronic) without hematuria: Secondary | ICD-10-CM | POA: Diagnosis not present

## 2016-03-16 ENCOUNTER — Other Ambulatory Visit: Payer: Self-pay | Admitting: Adult Health

## 2016-03-16 DIAGNOSIS — N3010 Interstitial cystitis (chronic) without hematuria: Secondary | ICD-10-CM | POA: Diagnosis not present

## 2016-03-16 DIAGNOSIS — R278 Other lack of coordination: Secondary | ICD-10-CM | POA: Diagnosis not present

## 2016-03-16 DIAGNOSIS — M62838 Other muscle spasm: Secondary | ICD-10-CM | POA: Diagnosis not present

## 2016-03-16 DIAGNOSIS — N3946 Mixed incontinence: Secondary | ICD-10-CM | POA: Diagnosis not present

## 2016-03-16 DIAGNOSIS — M6281 Muscle weakness (generalized): Secondary | ICD-10-CM | POA: Diagnosis not present

## 2016-03-16 MED FILL — ESCITALOPRAM 20 MG TABLET: 20 | 30 days supply | Qty: 30 | Fill #0

## 2016-03-23 ENCOUNTER — Other Ambulatory Visit (HOSPITAL_COMMUNITY)
Admission: RE | Admit: 2016-03-23 | Discharge: 2016-03-23 | Disposition: A | Discharge status: Home / Self Care | Payer: 59 | Source: Ambulatory Visit | Attending: Obstetrics & Gynecology | Admitting: Obstetrics & Gynecology

## 2016-03-23 ENCOUNTER — Encounter: Payer: Self-pay | Admitting: Adult Health

## 2016-03-23 ENCOUNTER — Ambulatory Visit (INDEPENDENT_AMBULATORY_CARE_PROVIDER_SITE_OTHER): Payer: 59 | Admitting: Adult Health

## 2016-03-23 VITALS — BP 124/60 | HR 76 | Ht 65.5 in | Wt 259.5 lb

## 2016-03-23 DIAGNOSIS — Z01419 Encounter for gynecological examination (general) (routine) without abnormal findings: Secondary | ICD-10-CM | POA: Diagnosis not present

## 2016-03-23 DIAGNOSIS — F419 Anxiety disorder, unspecified: Secondary | ICD-10-CM

## 2016-03-23 DIAGNOSIS — N3941 Urge incontinence: Secondary | ICD-10-CM | POA: Diagnosis not present

## 2016-03-23 DIAGNOSIS — Z7689 Persons encountering health services in other specified circumstances: Secondary | ICD-10-CM

## 2016-03-23 DIAGNOSIS — Z01411 Encounter for gynecological examination (general) (routine) with abnormal findings: Secondary | ICD-10-CM

## 2016-03-23 MED ORDER — ESCITALOPRAM OXALATE 20 MG PO TABS: 20.0000 mg | ORAL_TABLET | Freq: Every day | ORAL | 12 refills | Status: DC

## 2016-03-23 MED ORDER — LARIN 24 FE 1-20 MG-MCG(24) PO TABS: 1.0000 | ORAL_TABLET | Freq: Every day | ORAL | 11 refills | Status: DC

## 2016-03-23 NOTE — Progress Notes (Signed)
Patient ID: Karen Hoover, female   DOB: April 07, 1990, [age] y.o.   MRN: [id number] History of Present Illness: [name] is a 26 year old white female in for a well woman gyn exam and pap.She had back surgery in July by Dr Venetia MaxonStern.  [facility]    Current Medications, Allergies, Past Medical History, Past Surgical History, Family History and Social History were reviewed in [facility] electronic medical record.     Review of Systems: Patient denies any headaches, hearing loss, fatigue, blurred vision, shortness of breath, chest pain, abdominal pain, problems with bowel movements,  or intercourse(not having sex). 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    Physical Exam:BP 124/60 (BP Location: Left Arm, Patient Position: Sitting, Cuff Size: Large)   Pulse 76   Ht 5' 5.5" (1.664 m)   Wt 259 lb 8 oz (117.7 kg)   LMP 02/21/2016 (Approximate)   BMI 42.53 kg/m  General:  Well developed, well nourished, no acute distress Skin:  Warm and dry,has small sebaceous cyst right shoulder area Neck:  Midline trachea, normal thyroid, good ROM, no lymphadenopathy Lungs; Clear to auscultation bilaterally Breast:  No dominant palpable mass, retraction, or nipple discharge,left nipple chronically inverted  Cardiovascular: Regular rate and rhythm Abdomen:  Soft, non tender, no hepatosplenomegaly Pelvic:  External genitalia is normal in appearance, no lesions.  The vagina is normal in appearance. Urethra has no lesions or masses. The cervix is nulliparous, pap with reflex HPV performed.  Uterus is felt to be normal size, shape, and contour.  No adnexal masses or tenderness noted.Bladder is non tender, no masses felt. Extremities/musculoskeletal:  No swelling or varicosities noted, no clubbing or cyanosis Psych:  No mood changes, alert and cooperative,seems happy PHQ 2 score 0.  Impression: 1. Encounter for gynecological examination with Papanicolaou smear of cervix    2. Encounter for menstrual regulation   3. Anxiety   4. Urge incontinence       Plan: Refilled larin x 1 year Refilled lexapro 20 mg #30 take 1 daily with 12 refills Physical in 1 year Pap in 2-3 if normal F/U with Dr Marcello Fennelannebaum

## 2016-03-24 LAB — CYTOLOGY - PAP: Diagnosis: NEGATIVE

## 2016-03-28 DIAGNOSIS — M5416 Radiculopathy, lumbar region: Secondary | ICD-10-CM | POA: Diagnosis not present

## 2016-03-28 DIAGNOSIS — M6281 Muscle weakness (generalized): Secondary | ICD-10-CM | POA: Diagnosis not present

## 2016-03-28 DIAGNOSIS — M62838 Other muscle spasm: Secondary | ICD-10-CM | POA: Diagnosis not present

## 2016-03-28 DIAGNOSIS — Z6841 Body Mass Index (BMI) 40.0 and over, adult: Secondary | ICD-10-CM | POA: Diagnosis not present

## 2016-03-28 DIAGNOSIS — R030 Elevated blood-pressure reading, without diagnosis of hypertension: Secondary | ICD-10-CM | POA: Diagnosis not present

## 2016-03-28 DIAGNOSIS — M545 Low back pain: Secondary | ICD-10-CM | POA: Diagnosis not present

## 2016-03-28 DIAGNOSIS — N3946 Mixed incontinence: Secondary | ICD-10-CM | POA: Diagnosis not present

## 2016-03-28 DIAGNOSIS — N3010 Interstitial cystitis (chronic) without hematuria: Secondary | ICD-10-CM | POA: Diagnosis not present

## 2016-03-28 DIAGNOSIS — M5126 Other intervertebral disc displacement, lumbar region: Secondary | ICD-10-CM | POA: Diagnosis not present

## 2016-04-07 DIAGNOSIS — M6281 Muscle weakness (generalized): Secondary | ICD-10-CM | POA: Diagnosis not present

## 2016-04-07 DIAGNOSIS — M62838 Other muscle spasm: Secondary | ICD-10-CM | POA: Diagnosis not present

## 2016-04-07 DIAGNOSIS — N3010 Interstitial cystitis (chronic) without hematuria: Secondary | ICD-10-CM | POA: Diagnosis not present

## 2016-04-07 DIAGNOSIS — N3946 Mixed incontinence: Secondary | ICD-10-CM | POA: Diagnosis not present

## 2016-04-24 ENCOUNTER — Inpatient Hospital Stay (HOSPITAL_COMMUNITY)
Admission: AD | Admit: 2016-04-24 | Discharge: 2016-04-24 | Disposition: A | Discharge status: Home / Self Care | Payer: 59 | Source: Ambulatory Visit | Attending: Obstetrics & Gynecology | Admitting: Obstetrics & Gynecology

## 2016-04-24 ENCOUNTER — Encounter (HOSPITAL_COMMUNITY): Payer: Self-pay | Admitting: *Deleted

## 2016-04-24 DIAGNOSIS — D649 Anemia, unspecified: Secondary | ICD-10-CM | POA: Diagnosis not present

## 2016-04-24 DIAGNOSIS — E039 Hypothyroidism, unspecified: Secondary | ICD-10-CM | POA: Diagnosis not present

## 2016-04-24 DIAGNOSIS — J811 Chronic pulmonary edema: Secondary | ICD-10-CM | POA: Insufficient documentation

## 2016-04-24 DIAGNOSIS — F419 Anxiety disorder, unspecified: Secondary | ICD-10-CM | POA: Insufficient documentation

## 2016-04-24 DIAGNOSIS — N939 Abnormal uterine and vaginal bleeding, unspecified: Secondary | ICD-10-CM | POA: Insufficient documentation

## 2016-04-24 DIAGNOSIS — Z803 Family history of malignant neoplasm of breast: Secondary | ICD-10-CM | POA: Diagnosis not present

## 2016-04-24 DIAGNOSIS — Z8619 Personal history of other infectious and parasitic diseases: Secondary | ICD-10-CM | POA: Insufficient documentation

## 2016-04-24 DIAGNOSIS — Z3202 Encounter for pregnancy test, result negative: Secondary | ICD-10-CM | POA: Diagnosis not present

## 2016-04-24 DIAGNOSIS — R109 Unspecified abdominal pain: Secondary | ICD-10-CM | POA: Diagnosis not present

## 2016-04-24 DIAGNOSIS — Z9889 Other specified postprocedural states: Secondary | ICD-10-CM | POA: Diagnosis not present

## 2016-04-24 DIAGNOSIS — Z833 Family history of diabetes mellitus: Secondary | ICD-10-CM | POA: Diagnosis not present

## 2016-04-24 DIAGNOSIS — Z8349 Family history of other endocrine, nutritional and metabolic diseases: Secondary | ICD-10-CM | POA: Insufficient documentation

## 2016-04-24 LAB — URINALYSIS, ROUTINE W REFLEX MICROSCOPIC
Bilirubin Urine: NEGATIVE
Glucose, UA: NEGATIVE mg/dL
Ketones, ur: NEGATIVE mg/dL
LEUKOCYTES UA: NEGATIVE
NITRITE: NEGATIVE
PH: 5 (ref 5.0–8.0)
Protein, ur: 100 mg/dL — AB
Specific Gravity, Urine: 1.029 (ref 1.005–1.030)

## 2016-04-24 LAB — CBC
HEMATOCRIT: 34.6 % — AB (ref 36.0–46.0)
HEMOGLOBIN: 11.7 g/dL — AB (ref 12.0–15.0)
MCH: 26.6 pg (ref 26.0–34.0)
MCHC: 33.8 g/dL (ref 30.0–36.0)
MCV: 78.6 fL (ref 78.0–100.0)
Platelets: 366 10*3/uL (ref 150–400)
RBC: 4.4 MIL/uL (ref 3.87–5.11)
RDW: 14.1 % (ref 11.5–15.5)
WBC: 15.3 10*3/uL — AB (ref 4.0–10.5)

## 2016-04-24 LAB — POCT PREGNANCY, URINE: Preg Test, Ur: NEGATIVE

## 2016-04-24 MED ORDER — LARIN 24 FE 1-20 MG-MCG(24) PO TABS: 1.0000 | ORAL_TABLET | Freq: Every day | ORAL | 1 refills | Status: DC

## 2016-04-24 MED ORDER — TRAMADOL HCL 50 MG PO TABS: 50.0000 mg | ORAL_TABLET | Freq: Four times a day (QID) | ORAL | 0 refills | Status: DC | PRN

## 2016-04-24 MED ORDER — TRAMADOL HCL 50 MG PO TABS
50.0000 mg | ORAL_TABLET | Freq: Once | ORAL | Status: AC
  Administered 2016-04-24: 50 mg via ORAL
  Filled 2016-04-24: qty 1

## 2016-04-24 NOTE — MAU Provider Note (Signed)
History     CSN: [id number]  Arrival date and time: 04/24/16 0100   First Provider Initiated Contact with Patient 04/24/16 0202      Chief Complaint  Patient presents with  . Vaginal Bleeding  . Abdominal Pain   HPI   [name] is a 26 y.o.female G0P0000 here in MAU with heavy vaginal bleeding. She has normal 30 day cycles. She started this cycle yesterday. She is passing clots the size as her thumb. She is also having abdominal cramping that started at the same time. She took ibuprofen 600 mg last night. The pain started at a 9/10 before ibuprofen and now it is 6/10. She is a patient of family.   She is currently on BC pills, however took her last pill today and has no more refills.   OB History    Gravida Para Term Preterm AB Living   0 0 0 0 0 0   SAB TAB Ectopic Multiple Live Births   0 0 0 0        Past Medical History:  Diagnosis Date  . Anemia   . Anxiety 03/17/2015  . BV (bacterial vaginosis) 04/10/2014  . Chronic lower back pain    2 herniated disc sees Dr Channing Mutters  . Encounter for menstrual regulation 03/13/2014  . Hemorrhoids 01/12/2015  . History of rectal bleeding 01/12/2015  . Hypothyroidism   . IC (interstitial cystitis) 01/22/2015  . Pulmonary edema 08/21/2015   S/P back OR  . Thyroid disease   . Vaginal discharge 03/13/2014  . Vaginal itching 01/12/2015  . Vestibulitis, vulvar 04/21/2014  . Yeast infection 03/13/2014    Past Surgical History:  Procedure Laterality Date  . BACK SURGERY    . LAMINECTOMY AND MICRODISCECTOMY LUMBAR SPINE Right 08/21/2015   L4-5 and L5-S 1    Family History  Problem Relation Age of Onset  . Diabetes Mother   . Thyroid disease Mother   . Cancer Mother     breast  . Thyroid disease Brother   . Diabetes Maternal Grandmother     Social History  Substance Use Topics  . Smoking status: Never Smoker  . Smokeless tobacco: Never Used  . Alcohol use No    Allergies: No Known Allergies  Facility-Administered Medications  Prior to Admission  Medication Dose Route Frequency Provider Last Rate Last Dose  . 0.9 %  sodium chloride infusion  500 mL Intravenous Continuous Meryl Dare, MD       Prescriptions Prior to Admission  Medication Sig Dispense Refill Last Dose  . dicyclomine (BENTYL) 10 MG capsule Take 1 capsule (10 mg total) by mouth 4 (four) times daily -  before meals and at bedtime. Take 30 minutes before meals 120 capsule 11 Taking  . escitalopram (LEXAPRO) 20 MG tablet Take 1 tablet (20 mg total) by mouth daily. 30 tablet 12   . ferrous sulfate 325 (65 FE) MG tablet Take 325 mg by mouth daily.   Taking  . fluticasone (FLONASE) 50 MCG/ACT nasal spray Place 2 sprays into both nostrils as needed.   0 Taking  . HYDROcodone-acetaminophen (NORCO/VICODIN) 5-325 MG tablet Take 1 tablet by mouth every 6 (six) hours as needed. For pain 30 tablet 0 Taking  . ibuprofen (ADVIL,MOTRIN) 400 MG tablet Take 400 mg by mouth daily.   2 Taking  . LARIN 24 FE 1-20 MG-MCG(24) tablet Take 1 tablet by mouth daily. 1 Package 11   . levothyroxine (SYNTHROID, LEVOTHROID) 50 MCG tablet Take 50 mcg  by mouth daily.    Taking  . meclizine (ANTIVERT) 25 MG tablet Take 1 tablet (25 mg total) by mouth 3 (three) times daily as needed for dizziness. 30 tablet 0 Taking  . methocarbamol (ROBAXIN) 500 MG tablet Take 1 tablet (500 mg total) by mouth 3 (three) times daily between meals as needed. 20 tablet 0 Taking  . pentosan polysulfate (ELMIRON) 100 MG capsule Take 2 tablets twice daily (Patient taking differently: Take 200 mg by mouth 2 (two) times daily. ) 120 capsule 11 Taking   Results for orders placed or performed during the hospital encounter of 04/24/16 (from the past 48 hour(s))  Urinalysis, Routine w reflex microscopic     Status: Abnormal   Collection Time: 04/24/16  1:20 AM  Result Value Ref Range   Color, Urine YELLOW YELLOW   APPearance HAZY (A) CLEAR   Specific Gravity, Urine 1.029 1.005 - 1.030   pH 5.0 5.0 - 8.0    Glucose, UA NEGATIVE NEGATIVE mg/dL   Hgb urine dipstick LARGE (A) NEGATIVE   Bilirubin Urine NEGATIVE NEGATIVE   Ketones, ur NEGATIVE NEGATIVE mg/dL   Protein, ur 865 (A) NEGATIVE mg/dL   Nitrite NEGATIVE NEGATIVE   Leukocytes, UA NEGATIVE NEGATIVE   RBC / HPF TOO NUMEROUS TO COUNT 0 - 5 RBC/hpf   WBC, UA 0-5 0 - 5 WBC/hpf   Bacteria, UA RARE (A) NONE SEEN   Squamous Epithelial / LPF 0-5 (A) NONE SEEN   Mucous PRESENT   Pregnancy, urine POC     Status: None   Collection Time: 04/24/16  1:28 AM  Result Value Ref Range   Preg Test, Ur NEGATIVE NEGATIVE    Comment:        THE SENSITIVITY OF THIS METHODOLOGY IS >24 mIU/mL   CBC     Status: Abnormal   Collection Time: 04/24/16  1:38 AM  Result Value Ref Range   WBC 15.3 (H) 4.0 - 10.5 K/uL   RBC 4.40 3.87 - 5.11 MIL/uL   Hemoglobin 11.7 (L) 12.0 - 15.0 g/dL   HCT 78.4 (L) 69.6 - 29.5 %   MCV 78.6 78.0 - 100.0 fL   MCH 26.6 26.0 - 34.0 pg   MCHC 33.8 30.0 - 36.0 g/dL   RDW 28.4 13.2 - 44.0 %   Platelets 366 150 - 400 K/uL    Review of Systems  Constitutional: Negative for fever.  Gastrointestinal: Positive for abdominal pain.  Neurological: Negative for dizziness.   Physical Exam   Blood pressure 125/79, pulse 93, temperature 98.4 F (36.9 C), resp. rate 18, height 5\' 4"  (1.626 m), weight 255 lb (115.7 kg), last menstrual period 03/26/2016.  Physical Exam  Constitutional: She is oriented to person, place, and time. She appears well-developed and well-nourished. No distress.  HENT:  Head: Normocephalic.  Eyes: Pupils are equal, round, and reactive to light.  Respiratory: Effort normal.  Genitourinary:  Genitourinary Comments: Vagina - Small amount of dark red blood in the vaginal canal. Cervix - cervical polyp: technically difficult to evaluate due to discomfort/anxiety with pelvic exam.  Bimanual exam: Cervix closed, polyp palpated  Uterus non tender, normal size Adnexa non tender, no masses bilaterally Chaperone  present for exam.   Musculoskeletal: Normal range of motion.  Neurological: She is alert and oriented to person, place, and time.  Skin: Skin is warm. She is not diaphoretic.  Psychiatric: Her behavior is normal. Thought content normal.    MAU Course  Procedures  None  MDM  CBC Ultram X 1 pill   Assessment and Plan   A:  1. Abnormal uterine bleeding     P:  Discharge home in stable condition Rx: refill on BC pills, double pills for 5 days, ultram # 6 no refill  Patient encouraged to call [facility] to schedule appointment for evaluation of cervical polyp.  Patient states she would need pre-medication for pelvic exam and polyp removal. We discussed possibly having a US done to better evaluate polyp.  Bleeding precautions Return to MAU if symptoms worsen  Duane LopeJennifer I Yaritzi Craun, NP 04/25/2016 8:29 AM

## 2016-04-24 NOTE — MAU Note (Addendum)
About 2200, started having bad abd cramps. Took 3 ibuprofen which helped alittle. Started bleeding and clotting shortly after pain started. Soaked one pad in an hour and cont to have heavy bleeding. Thumb size clots and long. On BCPs (Junel) and has never had bleeding like this.

## 2016-04-24 NOTE — Discharge Instructions (Signed)

## 2016-04-25 MED FILL — traMADol HCL 50 MG TABS: 50 | 1 days supply | Qty: 6 | Fill #0

## 2016-04-25 MED FILL — ESCITALOPRAM 20 MG TABLET: 20 | 30 days supply | Qty: 30 | Fill #1

## 2016-04-27 MED FILL — BLISOVI 24 FE TABLET: 1-20 | 28 days supply | Qty: 28 | Fill #0

## 2016-04-29 ENCOUNTER — Telehealth: Payer: Self-pay | Admitting: *Deleted

## 2016-04-29 NOTE — Telephone Encounter (Signed)
Spoke with pt. [facility] and has a polyp. Pt is having extreme pain. Called out of work [employer]. She has an appt Monday with Dr. Despina HiddenEure. Ibuprofen is not helping pain. She took a Hydrocodone and a muscle relaxer a few minutes ago in hopes that it will help with pain. I spoke with JAG and she advised can try heating pad on stomach and keep appt for Monday. Pt voiced understanding. JSY

## 2016-05-02 ENCOUNTER — Encounter: Payer: Self-pay | Admitting: Obstetrics & Gynecology

## 2016-05-02 ENCOUNTER — Other Ambulatory Visit: Payer: Self-pay | Admitting: Obstetrics & Gynecology

## 2016-05-02 ENCOUNTER — Ambulatory Visit (INDEPENDENT_AMBULATORY_CARE_PROVIDER_SITE_OTHER): Payer: 59 | Admitting: Obstetrics & Gynecology

## 2016-05-02 VITALS — BP 128/80 | HR 89 | Wt 254.0 lb

## 2016-05-02 DIAGNOSIS — G8929 Other chronic pain: Secondary | ICD-10-CM

## 2016-05-02 DIAGNOSIS — N946 Dysmenorrhea, unspecified: Secondary | ICD-10-CM

## 2016-05-02 DIAGNOSIS — N841 Polyp of cervix uteri: Secondary | ICD-10-CM | POA: Diagnosis not present

## 2016-05-02 DIAGNOSIS — R102 Pelvic and perineal pain: Secondary | ICD-10-CM | POA: Diagnosis not present

## 2016-05-02 MED ORDER — MEGESTROL ACETATE 40 MG PO TABS: ORAL_TABLET | ORAL | 3 refills | Status: DC

## 2016-05-02 MED FILL — MEGESTROL 40 MG TABLET: 40 | 30 days supply | Qty: 45 | Fill #0

## 2016-05-02 NOTE — Progress Notes (Signed)
Chief Complaint  Patient presents with  . Follow-up    ED visit-polyp    Blood pressure 128/80, pulse 89, weight 254 lb (115.2 kg), last menstrual period 04/19/2016.  26 y.o. G0P0000 Patient's last menstrual period was 04/19/2016 (exact date). The current method of family planning is OCP (estrogen/progesterone).  Outpatient Encounter Prescriptions as of 05/02/2016  Medication Sig  . escitalopram (LEXAPRO) 20 MG tablet Take 1 tablet (20 mg total) by mouth daily.  . ferrous sulfate 325 (65 FE) MG tablet Take 325 mg by mouth daily.  . fluticasone (FLONASE) 50 MCG/ACT nasal spray Place 2 sprays into both nostrils as needed.   Marland Kitchen. ibuprofen (ADVIL,MOTRIN) 400 MG tablet Take 400 mg by mouth daily.   Marland Kitchen. LARIN 24 FE 1-20 MG-MCG(24) tablet Take 1 tablet by mouth daily.  Marland Kitchen. levothyroxine (SYNTHROID, LEVOTHROID) 50 MCG tablet Take 50 mcg by mouth daily.   . meclizine (ANTIVERT) 25 MG tablet Take 1 tablet (25 mg total) by mouth 3 (three) times daily as needed for dizziness.  . methocarbamol (ROBAXIN) 500 MG tablet Take 1 tablet (500 mg total) by mouth 3 (three) times daily between meals as needed.  . pentosan polysulfate (ELMIRON) 100 MG capsule Take 2 tablets twice daily (Patient taking differently: Take 200 mg by mouth 2 (two) times daily. )  . traMADol (ULTRAM) 50 MG tablet Take 1 tablet (50 mg total) by mouth every 6 (six) hours as needed.  . dicyclomine (BENTYL) 10 MG capsule Take 1 capsule (10 mg total) by mouth 4 (four) times daily -  before meals and at bedtime. Take 30 minutes before meals (Patient not taking: Reported on 05/02/2016)  . megestrol (MEGACE) 40 MG tablet 3 tablets a day for 5 days, 2 tablets a day for 5 days then 1 tablet daily   No facility-administered encounter medications on file as of 05/02/2016.     Subjective Pt originally went to ED 05/04/2016 for heavy bleeding and midpelvic pain and associated back and front leg pain, has history of back surgery last July. L4-5  laminectomy Was told in ED had a cervical polyp Has not skipped any pills or taken antibiotics  Objective General WDWN female NAD Vulva:  normal appearing vulva with no masses, tenderness or lesions Vagina:  normal mucosa, no discharge Cervix:  polyp the size of a matchhead removed Uterus:  normal size, contour, position, consistency, mobility, non-tender Adnexa: ovaries:present,  normal adnexa in size, nontender and no masses   Pertinent ROS No burning with urination, frequency or urgency No nausea, vomiting or diarrhea Nor fever chills or other constitutional symptoms   Labs or studies Reviewed her labs and scans    Impression Diagnoses this Encounter::   ICD-9-CM ICD-10-CM   1. Dysmenorrhea 625.3 N94.6 US Pelvis Complete     US Transvaginal Non-OB  2. Chronic pelvic pain in female 625.9 R10.2 US Pelvis Complete   338.29 G89.29 US Transvaginal Non-OB    Established relevant diagnosis(es): IC Plan/Recommendations: Meds ordered this encounter  Medications  . megestrol (MEGACE) 40 MG tablet    Sig: 3 tablets a day for 5 days, 2 tablets a day for 5 days then 1 tablet daily    Dispense:  45 tablet    Refill:  3    Labs or Scans Ordered: Orders Placed This Encounter  Procedures  . US Pelvis Complete  . US Transvaginal Non-OB    Management:: Stop OCP, megestrol  Sonogram 2 weeks to evaluate pelvic anatomy  Follow  up Return in about 2 weeks (around 05/16/2016) for GYN sono, Follow up, with Dr Despina Hidden.         All questions were answered.  Past Medical History:  Diagnosis Date  . Anemia   . Anxiety 03/17/2015  . BV (bacterial vaginosis) 04/10/2014  . Chronic lower back pain    2 herniated disc sees Dr Channing Mutters  . Encounter for menstrual regulation 03/13/2014  . Hemorrhoids 01/12/2015  . History of rectal bleeding 01/12/2015  . Hypothyroidism   . IC (interstitial cystitis) 01/22/2015  . Pulmonary edema 08/21/2015   S/P back OR  . Thyroid disease   . Vaginal  discharge 03/13/2014  . Vaginal itching 01/12/2015  . Vestibulitis, vulvar 04/21/2014  . Yeast infection 03/13/2014    Past Surgical History:  Procedure Laterality Date  . BACK SURGERY    . LAMINECTOMY AND MICRODISCECTOMY LUMBAR SPINE Right 08/21/2015   L4-5 and L5-S 1    OB History    Gravida Para Term Preterm AB Living   0 0 0 0 0 0   SAB TAB Ectopic Multiple Live Births   0 0 0 0        No Known Allergies  Social History   Social History  . Marital status: Single    Spouse name: N/A  . Number of children: N/A  . 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 Smoking status: Never Smoker  . Smokeless tobacco: Never Used  . Alcohol use No  . Drug use: No  . Sexual activity: No   Other Topics Concern  . None   Social History Narrative  . None    Family History  Problem Relation Age of Onset  . Diabetes Mother   . Thyroid disease Mother   . Cancer Mother     breast  . Thyroid disease Brother   . Diabetes Maternal Grandmother

## 2016-05-02 NOTE — Addendum Note (Signed)
Addended by: Tish FredericksonLANCASTER, Patryk Conant A on: 05/02/2016 10:15 AM   Modules accepted: Orders

## 2016-05-03 DIAGNOSIS — M6281 Muscle weakness (generalized): Secondary | ICD-10-CM | POA: Diagnosis not present

## 2016-05-03 DIAGNOSIS — N3946 Mixed incontinence: Secondary | ICD-10-CM | POA: Diagnosis not present

## 2016-05-03 DIAGNOSIS — M6289 Other specified disorders of muscle: Secondary | ICD-10-CM | POA: Diagnosis not present

## 2016-05-03 DIAGNOSIS — M62838 Other muscle spasm: Secondary | ICD-10-CM | POA: Diagnosis not present

## 2016-05-05 DIAGNOSIS — M5416 Radiculopathy, lumbar region: Secondary | ICD-10-CM | POA: Diagnosis not present

## 2016-05-09 ENCOUNTER — Telehealth: Payer: Self-pay | Admitting: *Deleted

## 2016-05-10 DIAGNOSIS — M6281 Muscle weakness (generalized): Secondary | ICD-10-CM | POA: Diagnosis not present

## 2016-05-10 DIAGNOSIS — N3010 Interstitial cystitis (chronic) without hematuria: Secondary | ICD-10-CM | POA: Diagnosis not present

## 2016-05-10 DIAGNOSIS — N3946 Mixed incontinence: Secondary | ICD-10-CM | POA: Diagnosis not present

## 2016-05-10 DIAGNOSIS — R32 Unspecified urinary incontinence: Secondary | ICD-10-CM | POA: Diagnosis not present

## 2016-05-10 DIAGNOSIS — M62838 Other muscle spasm: Secondary | ICD-10-CM | POA: Diagnosis not present

## 2016-05-13 ENCOUNTER — Ambulatory Visit (INDEPENDENT_AMBULATORY_CARE_PROVIDER_SITE_OTHER): Payer: 59 | Admitting: Obstetrics & Gynecology

## 2016-05-13 ENCOUNTER — Ambulatory Visit (INDEPENDENT_AMBULATORY_CARE_PROVIDER_SITE_OTHER): Payer: 59

## 2016-05-13 ENCOUNTER — Encounter: Payer: Self-pay | Admitting: Obstetrics & Gynecology

## 2016-05-13 VITALS — BP 128/72 | HR 94 | Wt 255.0 lb

## 2016-05-13 DIAGNOSIS — R102 Pelvic and perineal pain: Secondary | ICD-10-CM

## 2016-05-13 DIAGNOSIS — G8929 Other chronic pain: Secondary | ICD-10-CM

## 2016-05-13 DIAGNOSIS — N946 Dysmenorrhea, unspecified: Secondary | ICD-10-CM

## 2016-05-13 NOTE — Progress Notes (Signed)
PELVIC US TA/TV: homogeneous anteverted uterus,wnl, EEC 2.4 mm,normal ov's bilat (limited view),no free fluid,some pelvic discomfort during ultrasound

## 2016-05-13 NOTE — Progress Notes (Signed)
Follow up appointment for results  Chief Complaint  Patient presents with  . Follow-up    gyn u/s    Blood pressure 128/72, pulse 94, weight 255 lb (115.7 kg), last menstrual period 04/19/2016.  US Transvaginal Non-ob  Result Date: 05/13/2016 GYNECOLOGIC SONOGRAM Karen Hoover is a 26 y.o. G0P0000 LMP 04/21/2016 for a pelvic sonogram for pelvic pain. Uterus                      8.6 x 3.6 x 6.1 cm,  homogeneous anteverted uterus,wnl Endometrium          2.4 mm, symmetrical, wnl Right ovary             2.1 x 1.5 x 1.7 cm, wnl (limited view) Left ovary                3 x 1.7 x 2.1 cm, wnl  (limited view) No free fluid Technician Comments: PELVIC US TA/TV: homogeneous anteverted uterus,wnl, EEC 2.4 mm,normal ov's bilat (limited view),no free fluid,some pelvic discomfort during ultrasound Karen Hoover 05/13/2016 12:37 PM Clinical Impression and recommendations: I have reviewed the sonogram results above, combined with the patient's current clinical course, below are my impressions and any appropriate recommendations for management based on the sonographic findings. Uterus normal without abnormalities Endometrium is thin normal on megestrol Both ovaries are normal Karen Hoover 05/13/2016 12:59 PM   US Pelvis Complete  Result Date: 05/13/2016 GYNECOLOGIC SONOGRAM Karen Hoover is a 26 y.o. G0P0000 LMP 04/21/2016 for a pelvic sonogram for pelvic pain. Uterus                      8.6 x 3.6 x 6.1 cm,  homogeneous anteverted uterus,wnl Endometrium          2.4 mm, symmetrical, wnl Right ovary             2.1 x 1.5 x 1.7 cm, wnl (limited view) Left ovary                3 x 1.7 x 2.1 cm, wnl  (limited view) No free fluid Technician Comments: PELVIC US TA/TV: homogeneous anteverted uterus,wnl, EEC 2.4 mm,normal ov's bilat (limited view),no free fluid,some pelvic discomfort during ultrasound Karen Hoover 05/13/2016 12:37 PM Clinical Impression and recommendations: I have reviewed the sonogram results above, combined with  the patient's current clinical course, below are my impressions and any appropriate recommendations for management based on the sonographic findings. Uterus normal without abnormalities Endometrium is thin normal on megestrol Both ovaries are normal Karen Hoover 05/13/2016 12:59 PM      MEDS ordered this encounter: No orders of the defined types were placed in this encounter.   Orders for this encounter: No orders of the defined types were placed in this encounter.   Impression: Chronic pelvic pain in female  Dysmenorrhea    Plan: Sonogram is normal, no pelvic pathology, stop megestrol restart ocp Per my previous impression, feels much better after a steroid injection in her back  Follow Up: Return if symptoms worsen or fail to improve.       Face to face time:  10 minutes  Greater than 50% of the visit time was spent in counseling and coordination of care with the patient.  The summary and outline of the counseling and care coordination is summarized in the note above.   All questions were answered.  Past Medical History:  Diagnosis Date  . Anemia   .  Anxiety 03/17/2015  . BV (bacterial vaginosis) 04/10/2014  . Chronic lower back pain    2 herniated disc sees Dr Channing Mutters  . Encounter for menstrual regulation 03/13/2014  . Hemorrhoids 01/12/2015  . History of rectal bleeding 01/12/2015  . Hypothyroidism   . IC (interstitial cystitis) 01/22/2015  . Pulmonary edema 08/21/2015   S/P back OR  . Thyroid disease   . Vaginal discharge 03/13/2014  . Vaginal itching 01/12/2015  . Vestibulitis, vulvar 04/21/2014  . Yeast infection 03/13/2014    Past Surgical History:  Procedure Laterality Date  . BACK SURGERY    . LAMINECTOMY AND MICRODISCECTOMY LUMBAR SPINE Right 08/21/2015   L4-5 and L5-S 1    OB History    Gravida Para Term Preterm AB Living   0 0 0 0 0 0   SAB TAB Ectopic Multiple Live Births   0 0 0 0        No Known Allergies  Social History   Social History  .  Marital status: Single    Spouse name: N/A  . Number of children: N/A  . 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 Smoking status: Never Smoker  . Smokeless tobacco: Never Used  . Alcohol use No  . Drug use: No  . Sexual activity: No   Other Topics Concern  . None   Social History Narrative  . None    Family History  Problem Relation Age of Onset  . Diabetes Mother   . Thyroid disease Mother   . Cancer Mother     breast  . Thyroid disease Brother   . Diabetes Maternal Grandmother

## 2016-05-17 ENCOUNTER — Other Ambulatory Visit: Payer: 59

## 2016-05-17 ENCOUNTER — Ambulatory Visit: Payer: 59 | Admitting: Obstetrics & Gynecology

## 2016-05-27 MED FILL — ESCITALOPRAM 20 MG TABLET: 20 | 30 days supply | Qty: 30 | Fill #2

## 2016-05-27 MED FILL — LEVOTHYROXINE 50 MCG TABLET: 50 | 90 days supply | Qty: 90 | Fill #3

## 2016-05-30 NOTE — Telephone Encounter (Signed)
Erroneous encounter

## 2016-06-22 MED FILL — BLISOVI 24 FE TABLET: 1-20 | 28 days supply | Qty: 28 | Fill #1

## 2016-06-30 DIAGNOSIS — N3946 Mixed incontinence: Secondary | ICD-10-CM | POA: Diagnosis not present

## 2016-06-30 DIAGNOSIS — M62838 Other muscle spasm: Secondary | ICD-10-CM | POA: Diagnosis not present

## 2016-06-30 DIAGNOSIS — N3010 Interstitial cystitis (chronic) without hematuria: Secondary | ICD-10-CM | POA: Diagnosis not present

## 2016-06-30 DIAGNOSIS — M6281 Muscle weakness (generalized): Secondary | ICD-10-CM | POA: Diagnosis not present

## 2016-07-08 ENCOUNTER — Other Ambulatory Visit: Payer: Self-pay | Admitting: Adult Health

## 2016-07-08 MED FILL — ESCITALOPRAM 20 MG TABLET: 20 | 30 days supply | Qty: 30 | Fill #3

## 2016-07-22 DIAGNOSIS — E039 Hypothyroidism, unspecified: Secondary | ICD-10-CM | POA: Diagnosis not present

## 2016-07-22 DIAGNOSIS — Z0000 Encounter for general adult medical examination without abnormal findings: Secondary | ICD-10-CM | POA: Diagnosis not present

## 2016-07-26 DIAGNOSIS — D509 Iron deficiency anemia, unspecified: Secondary | ICD-10-CM | POA: Diagnosis not present

## 2016-07-26 DIAGNOSIS — M5136 Other intervertebral disc degeneration, lumbar region: Secondary | ICD-10-CM | POA: Diagnosis not present

## 2016-07-26 DIAGNOSIS — L989 Disorder of the skin and subcutaneous tissue, unspecified: Secondary | ICD-10-CM | POA: Diagnosis not present

## 2016-07-26 DIAGNOSIS — Z1389 Encounter for screening for other disorder: Secondary | ICD-10-CM | POA: Diagnosis not present

## 2016-07-26 DIAGNOSIS — E039 Hypothyroidism, unspecified: Secondary | ICD-10-CM | POA: Diagnosis not present

## 2016-07-26 DIAGNOSIS — Z0000 Encounter for general adult medical examination without abnormal findings: Secondary | ICD-10-CM | POA: Diagnosis not present

## 2016-07-26 DIAGNOSIS — Z6841 Body Mass Index (BMI) 40.0 and over, adult: Secondary | ICD-10-CM | POA: Diagnosis not present

## 2016-08-05 ENCOUNTER — Telehealth: Payer: Self-pay | Admitting: *Deleted

## 2016-08-05 DIAGNOSIS — R32 Unspecified urinary incontinence: Secondary | ICD-10-CM | POA: Diagnosis not present

## 2016-08-05 DIAGNOSIS — N3010 Interstitial cystitis (chronic) without hematuria: Secondary | ICD-10-CM | POA: Diagnosis not present

## 2016-08-05 MED ORDER — LARIN 24 FE 1-20 MG-MCG(24) PO TABS: 1.0000 | ORAL_TABLET | Freq: Every day | ORAL | 6 refills | Status: DC

## 2016-08-05 MED FILL — BLISOVI 24 FE TABLET: 1-20 | 84 days supply | Qty: 84 | Fill #0

## 2016-08-05 NOTE — Telephone Encounter (Signed)
Pt called requesting refill on her BC pill. She states that she is going out of town tomorrow and would need to get it today. She says that she has another type of BC pill that she can use if she cant get a refill today. Advised pt that switching pills could mess her cycle up. Informed her we would send the refill in today.

## 2016-08-05 NOTE — Telephone Encounter (Signed)
Refilled Karen Hoover

## 2016-08-19 MED FILL — ESCITALOPRAM 20 MG TABLET: 20 | 30 days supply | Qty: 30 | Fill #4

## 2016-08-25 DIAGNOSIS — M5416 Radiculopathy, lumbar region: Secondary | ICD-10-CM | POA: Diagnosis not present

## 2016-09-15 MED FILL — LEVOTHYROXINE 50 MCG TABLET: 50 | 90 days supply | Qty: 90 | Fill #0

## 2016-09-15 MED FILL — ESCITALOPRAM 20 MG TABLET: 20 | 30 days supply | Qty: 30 | Fill #5

## 2016-09-26 DIAGNOSIS — M545 Low back pain: Secondary | ICD-10-CM | POA: Diagnosis not present

## 2016-09-26 DIAGNOSIS — M5416 Radiculopathy, lumbar region: Secondary | ICD-10-CM | POA: Diagnosis not present

## 2016-09-26 DIAGNOSIS — M5126 Other intervertebral disc displacement, lumbar region: Secondary | ICD-10-CM | POA: Diagnosis not present

## 2016-09-26 DIAGNOSIS — R030 Elevated blood-pressure reading, without diagnosis of hypertension: Secondary | ICD-10-CM | POA: Diagnosis not present

## 2016-09-26 DIAGNOSIS — Z6841 Body Mass Index (BMI) 40.0 and over, adult: Secondary | ICD-10-CM | POA: Diagnosis not present

## 2016-09-26 MED FILL — METHOCARBAMOL 500 MG TABLET: 500 | 30 days supply | Qty: 60 | Fill #0

## 2016-10-24 MED FILL — BLISOVI 24 FE TABLET: 1-20 | 84 days supply | Qty: 84 | Fill #1

## 2016-10-24 MED FILL — ESCITALOPRAM 20 MG TABLET: 20 | 30 days supply | Qty: 30 | Fill #6

## 2016-11-04 MED FILL — AZITHROMYCIN 250 MG TAB: 250 | 5 days supply | Qty: 6 | Fill #0

## 2016-11-09 DIAGNOSIS — J019 Acute sinusitis, unspecified: Secondary | ICD-10-CM | POA: Diagnosis not present

## 2016-11-09 DIAGNOSIS — Z6841 Body Mass Index (BMI) 40.0 and over, adult: Secondary | ICD-10-CM | POA: Diagnosis not present

## 2016-11-09 DIAGNOSIS — J028 Acute pharyngitis due to other specified organisms: Secondary | ICD-10-CM | POA: Diagnosis not present

## 2016-11-09 MED FILL — predniSONE 20 MG TABS: 20 | 7 days supply | Qty: 9 | Fill #0

## 2016-11-09 MED FILL — AMOXICILLIN 875 MG TABLET: 875 | 7 days supply | Qty: 14 | Fill #0

## 2016-11-24 DIAGNOSIS — M5416 Radiculopathy, lumbar region: Secondary | ICD-10-CM | POA: Diagnosis not present

## 2016-11-24 MED FILL — traMADol HCL 50 MG TABS: 50 | 12 days supply | Qty: 50 | Fill #0

## 2016-11-29 MED FILL — ESCITALOPRAM 20 MG TABLET: 20 | 30 days supply | Qty: 30 | Fill #0

## 2017-01-03 MED FILL — ESCITALOPRAM 20 MG TABLET: 20 | 30 days supply | Qty: 30 | Fill #0

## 2017-01-03 MED FILL — LEVOTHYROXINE 50 MCG TABLET: 50 | 90 days supply | Qty: 90 | Fill #1

## 2017-02-09 MED FILL — BLISOVI 24 FE TABLET: 1-20 | 84 days supply | Qty: 84 | Fill #2

## 2017-02-09 MED FILL — ESCITALOPRAM 20 MG TABLET: 20 | 30 days supply | Qty: 30 | Fill #1

## 2017-03-13 MED FILL — ESCITALOPRAM 20 MG TABLET: 20 | 30 days supply | Qty: 30 | Fill #2

## 2017-04-13 ENCOUNTER — Other Ambulatory Visit: Payer: Self-pay | Admitting: Adult Health

## 2017-04-13 MED FILL — LEVOTHYROXINE 50 MCG TABLET: 50 | 90 days supply | Qty: 90 | Fill #2

## 2017-04-13 MED FILL — ESCITALOPRAM 20 MG TABLET: 20 | 30 days supply | Qty: 30 | Fill #0

## 2017-04-17 MED FILL — BLISOVI 24 FE TABLET: 1-20 | 84 days supply | Qty: 84 | Fill #0

## 2017-05-19 MED FILL — ESCITALOPRAM 20 MG TABLET: 20 | 30 days supply | Qty: 30 | Fill #1

## 2017-06-19 MED FILL — ESCITALOPRAM 20 MG TABLET: 20 | 30 days supply | Qty: 30 | Fill #2

## 2017-06-22 DIAGNOSIS — M5416 Radiculopathy, lumbar region: Secondary | ICD-10-CM | POA: Diagnosis not present

## 2017-07-10 DIAGNOSIS — R82998 Other abnormal findings in urine: Secondary | ICD-10-CM | POA: Diagnosis not present

## 2017-07-10 DIAGNOSIS — Z0000 Encounter for general adult medical examination without abnormal findings: Secondary | ICD-10-CM | POA: Diagnosis not present

## 2017-07-10 DIAGNOSIS — E038 Other specified hypothyroidism: Secondary | ICD-10-CM | POA: Diagnosis not present

## 2017-07-11 DIAGNOSIS — R7301 Impaired fasting glucose: Secondary | ICD-10-CM | POA: Diagnosis not present

## 2017-07-21 DIAGNOSIS — D508 Other iron deficiency anemias: Secondary | ICD-10-CM | POA: Diagnosis not present

## 2017-07-21 DIAGNOSIS — Z1389 Encounter for screening for other disorder: Secondary | ICD-10-CM | POA: Diagnosis not present

## 2017-07-21 DIAGNOSIS — E1169 Type 2 diabetes mellitus with other specified complication: Secondary | ICD-10-CM | POA: Diagnosis not present

## 2017-07-21 DIAGNOSIS — M5416 Radiculopathy, lumbar region: Secondary | ICD-10-CM | POA: Diagnosis not present

## 2017-07-21 DIAGNOSIS — F331 Major depressive disorder, recurrent, moderate: Secondary | ICD-10-CM | POA: Diagnosis not present

## 2017-07-21 DIAGNOSIS — Z0000 Encounter for general adult medical examination without abnormal findings: Secondary | ICD-10-CM | POA: Diagnosis not present

## 2017-07-21 DIAGNOSIS — E038 Other specified hypothyroidism: Secondary | ICD-10-CM | POA: Diagnosis not present

## 2017-07-21 DIAGNOSIS — Z803 Family history of malignant neoplasm of breast: Secondary | ICD-10-CM | POA: Diagnosis not present

## 2017-07-21 MED FILL — LEVOTHYROXINE 50 MCG TABLET: 50 | 90 days supply | Qty: 90 | Fill #0

## 2017-07-24 MED FILL — ESCITALOPRAM 20 MG TABLET: 20 | 30 days supply | Qty: 30 | Fill #3

## 2017-07-24 MED FILL — BLISOVI 24 FE TABLET: 1-20 | 84 days supply | Qty: 84 | Fill #1

## 2017-07-28 DIAGNOSIS — R030 Elevated blood-pressure reading, without diagnosis of hypertension: Secondary | ICD-10-CM | POA: Diagnosis not present

## 2017-07-28 DIAGNOSIS — M5416 Radiculopathy, lumbar region: Secondary | ICD-10-CM | POA: Diagnosis not present

## 2017-07-28 DIAGNOSIS — M545 Low back pain: Secondary | ICD-10-CM | POA: Diagnosis not present

## 2017-07-28 DIAGNOSIS — Z6841 Body Mass Index (BMI) 40.0 and over, adult: Secondary | ICD-10-CM | POA: Diagnosis not present

## 2017-07-28 MED FILL — traMADol HCL 50 MG TABS: 50 | 12 days supply | Qty: 50 | Fill #0

## 2017-07-28 MED FILL — METHOCARBAMOL 500 MG TABLET: 500 | 30 days supply | Qty: 60 | Fill #0

## 2017-08-23 MED FILL — ESCITALOPRAM 20 MG TABLET: 20 | 30 days supply | Qty: 30 | Fill #4

## 2017-09-06 DIAGNOSIS — E1169 Type 2 diabetes mellitus with other specified complication: Secondary | ICD-10-CM | POA: Diagnosis not present

## 2017-09-06 DIAGNOSIS — Z6841 Body Mass Index (BMI) 40.0 and over, adult: Secondary | ICD-10-CM | POA: Diagnosis not present

## 2017-09-06 MED FILL — FREESTYLE LITE METER: 1 days supply | Qty: 1 | Fill #0

## 2017-09-06 MED FILL — FREESTYLE LANCETS: 50 days supply | Qty: 100 | Fill #0

## 2017-09-06 MED FILL — METFORMIN HCL ER 500 MG TAB: 500 | 30 days supply | Qty: 120 | Fill #0

## 2017-09-06 MED FILL — FREESTYLE LITE TEST STRIP: 50 days supply | Qty: 100 | Fill #0

## 2017-09-25 MED FILL — ESCITALOPRAM 20 MG TABLET: 20 | 30 days supply | Qty: 30 | Fill #5

## 2017-09-28 DIAGNOSIS — M5416 Radiculopathy, lumbar region: Secondary | ICD-10-CM | POA: Diagnosis not present

## 2017-10-02 ENCOUNTER — Telehealth: Payer: Self-pay | Admitting: Genetic Counselor

## 2017-10-02 ENCOUNTER — Encounter: Payer: Self-pay | Admitting: Genetic Counselor

## 2017-10-02 NOTE — Telephone Encounter (Signed)
New genetics referral received [facility]werda [facility]. Pt has been scheduled to see Maylon CosKaren Powell on 9/25 at 2pm. Pt aware to arrive 15 minutes early. Letter mailed.

## 2017-10-04 IMAGING — MR MR LUMBAR SPINE W/O CM
4 of 5 series · 15 of 48 positions shown · non-contrast
Comparison: 02/14/2013.

CLINICAL DATA: Low back pain radiating to both legs.

EXAM:
MRI LUMBAR SPINE WITHOUT CONTRAST
TECHNIQUE: Multiplanar, multisequence MR imaging of the lumbar spine was
performed. No intravenous contrast was administered.

[Series 4: T2 · sagittal · 4.0mm · 0.68mm/px · 5 of 13 slices shown (1 of 2)]
[im 1/13]
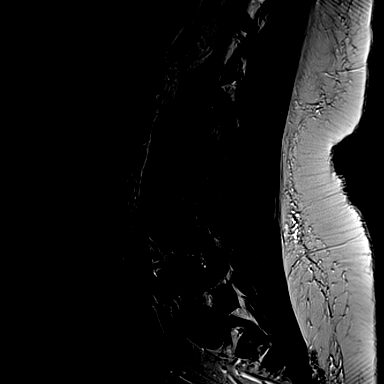
[im 4/13]
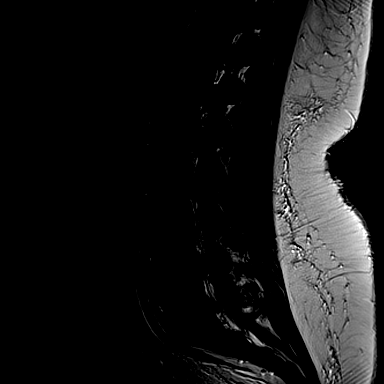
[im 7/13]
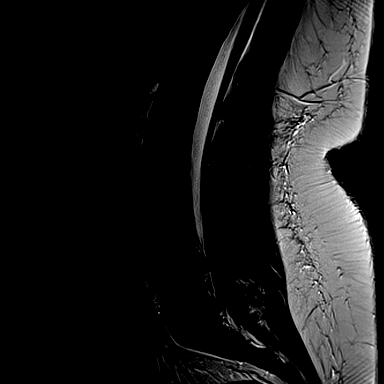
[im 10/13]
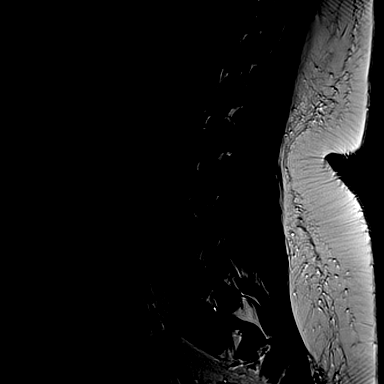
[im 13/13]
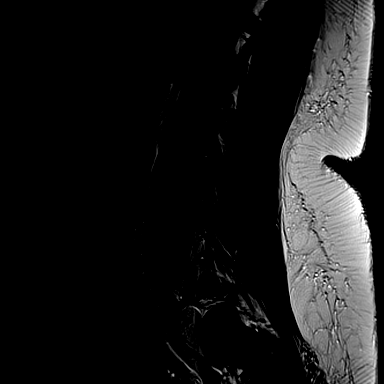

[Series 5: T1 · sagittal · 4.0mm · 0.37mm/px · 3 of 13 slices shown (1 of 2)]
[im 3/13]
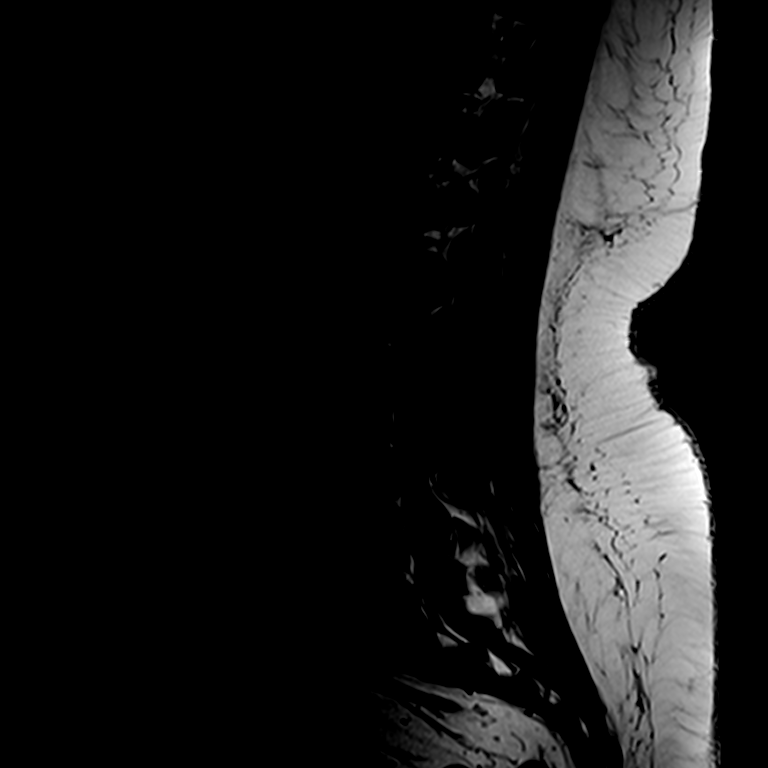
[im 8/13]
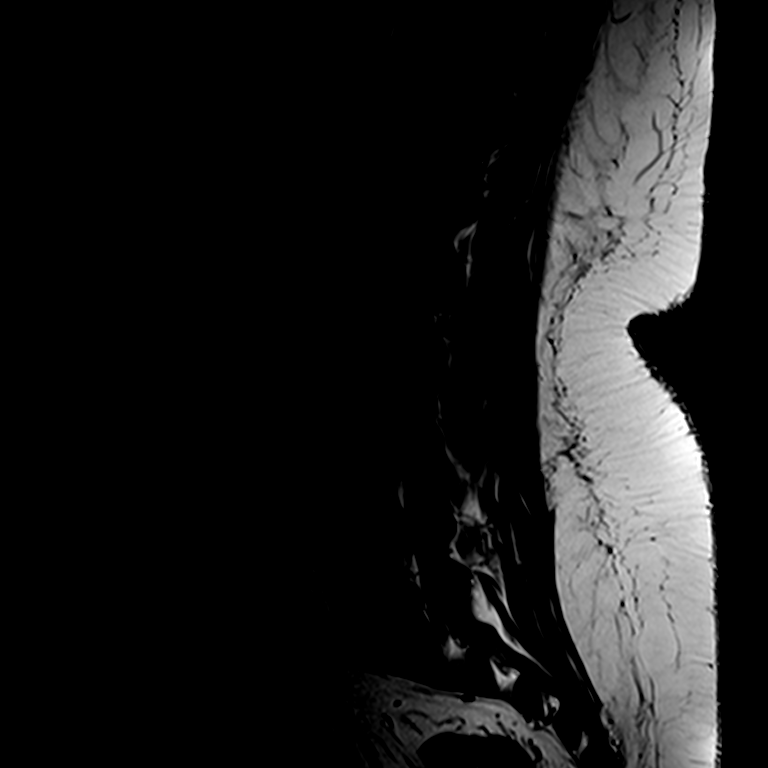
[im 13/13]
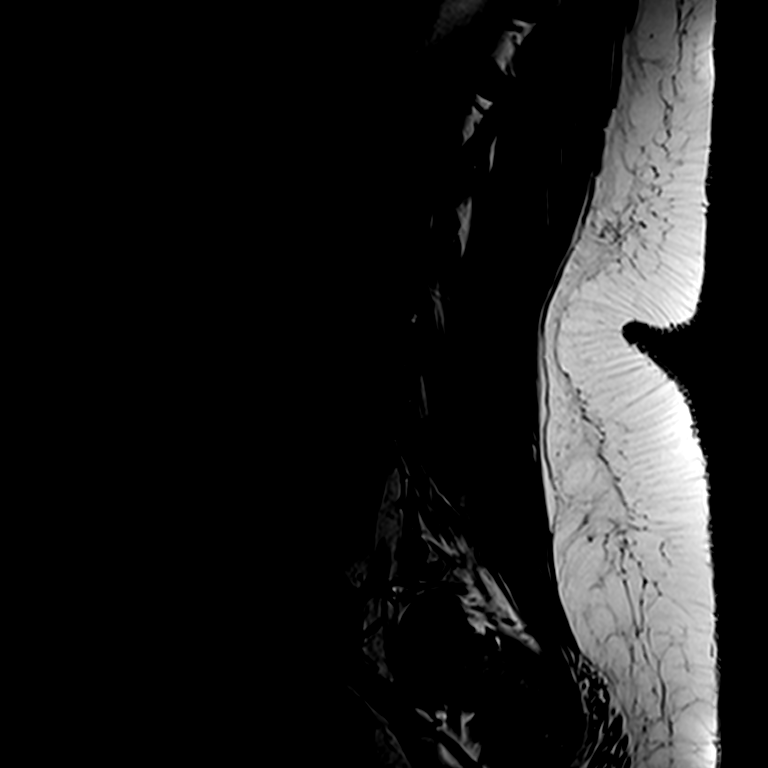

[Series 7: T2 · axial · 4.0mm · 0.21mm/px · z∈[-235,-88]mm · 4 of 35 slices shown (2 of 2)]
[im 1/35]
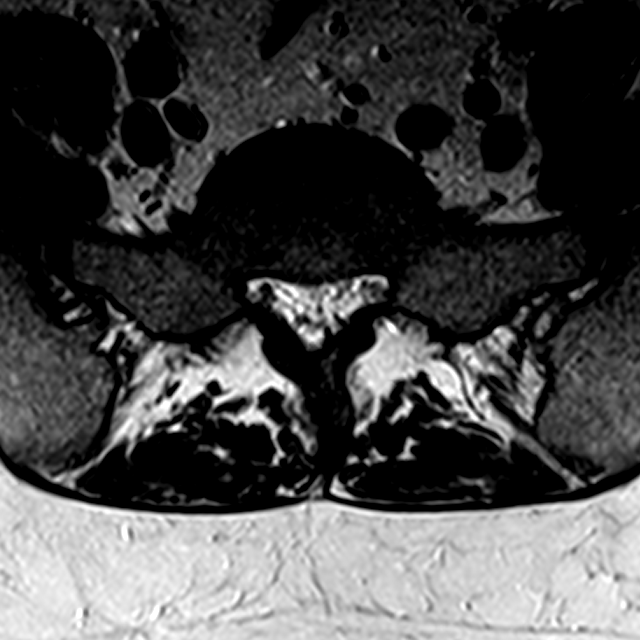
[im 5/35]
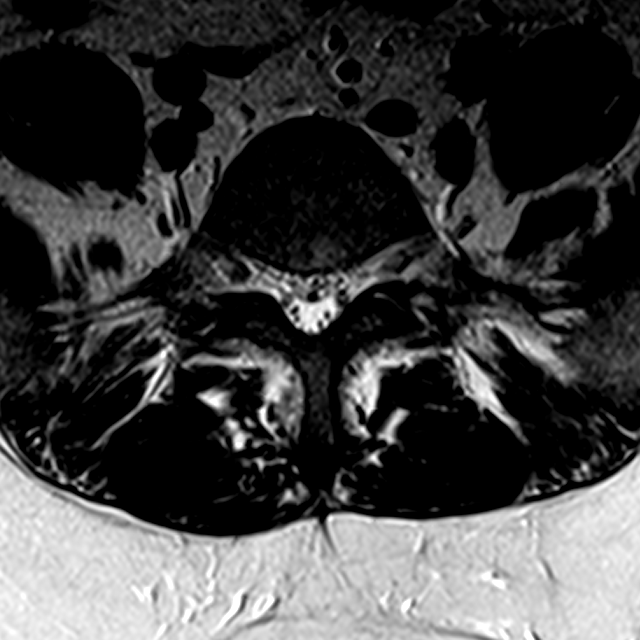
[im 18/35]
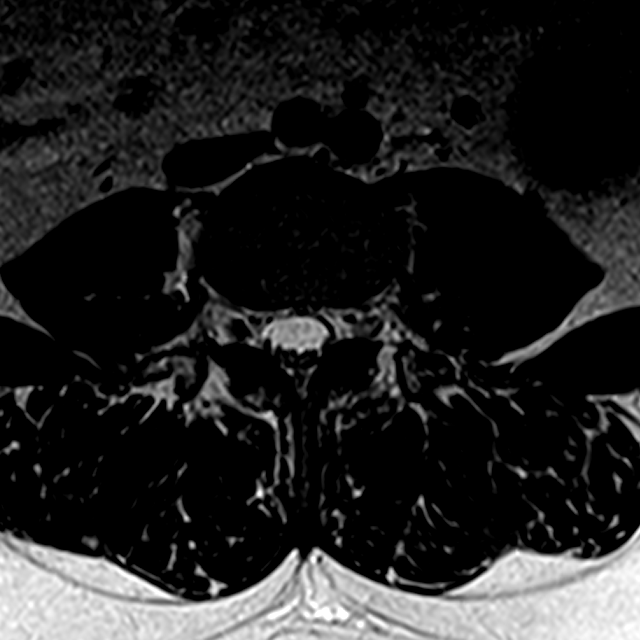
[im 30/35]
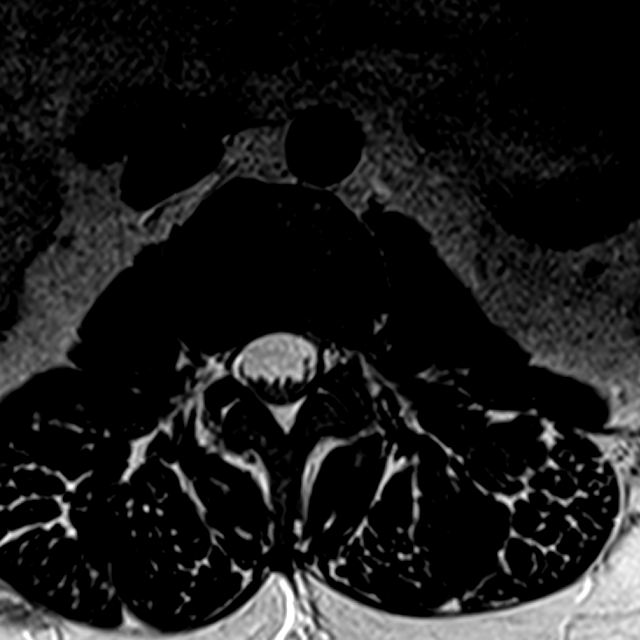

[Series 9: T1 · axial · 4.0mm · 0.22mm/px · z∈[-220,-88]mm · 3 of 35 slices shown (2 of 2)]
[im 5/35]
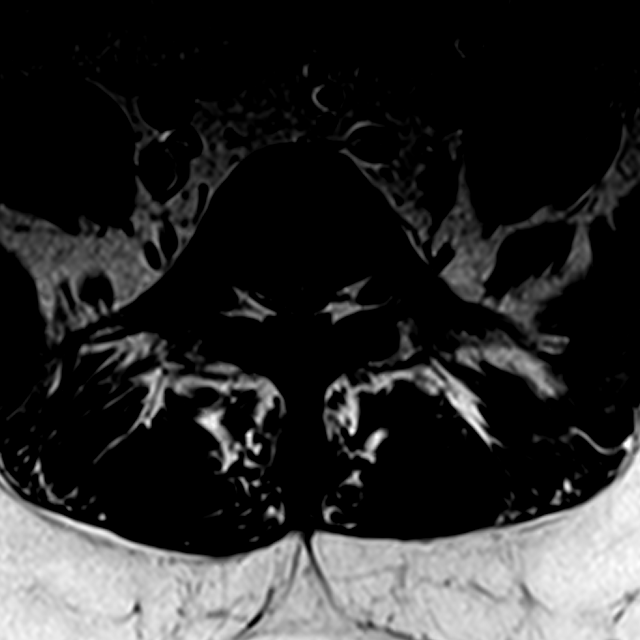
[im 18/35]
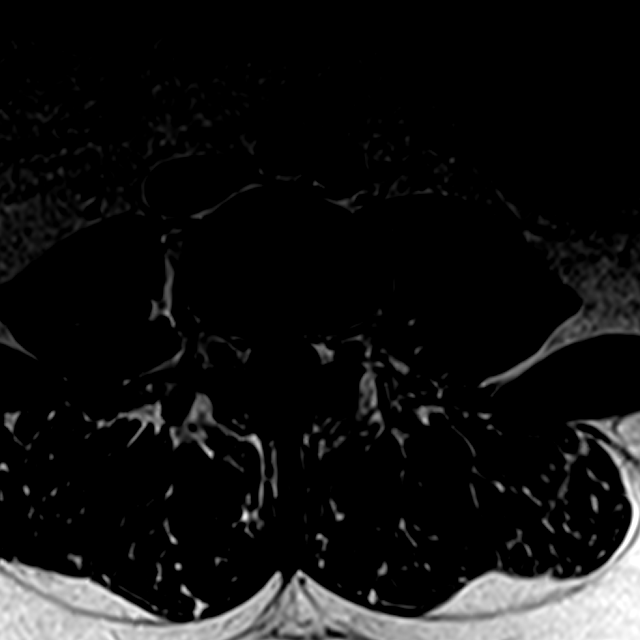
[im 30/35]
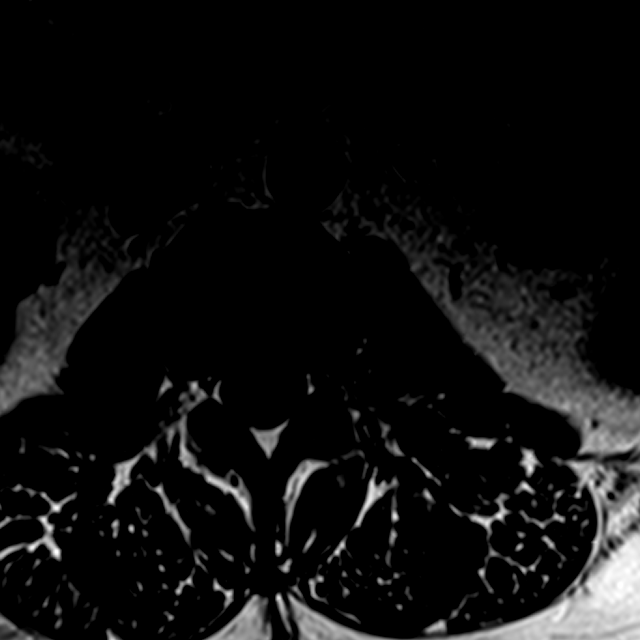

[15 of 48 positions shown; findings below may reference images not displayed]

FINDINGS: Segmentation:  Normal

Alignment:  Physiologic.

Vertebrae:  No fracture, evidence of discitis, or bone lesion.

Conus medullaris: Extends to the L1 level and appears normal.

Paraspinal and other soft tissues: Unremarkable. No bladder
enlargement. Increased body habitus without features suggestive of
epidural lipomatosis.

Disc levels:

L1-L2:  Normal.

L2-L3:  Normal.

L3-L4:  Normal.

L4-L5: Disc space narrowing. Central extrusion. There may be mild
ligamentum flavum hypertrophy but no facet arthropathy. Moderate to
severe stenosis. LEFT greater than RIGHT subarticular zone narrowing
affecting the L5 nerve roots. No foraminal narrowing of
significance.

L5-S1: Mild disc space narrowing. Central disc extrusion. Slight
caudal down turning. Moderate to severe stenosis. BILATERAL S1 nerve
root impingement. No foraminal narrowing.

Compared with 8703, the extrusion at L4-5 is smaller. The extrusion
at L5-S1 is worse.
IMPRESSION: Moderate to severe spinal stenosis at L4-5 and L5-S1 related to
central disc extrusions; BILATERAL L5 and BILATERAL S1 nerve root
impingement are noted, respectively.

Similar appearance to 8703.

## 2017-10-19 MED FILL — ESCITALOPRAM 20 MG TABLET: 20 | 30 days supply | Qty: 30 | Fill #6

## 2017-10-19 MED FILL — LEVOTHYROXINE 50 MCG TABLET: 50 | 90 days supply | Qty: 90 | Fill #1

## 2017-10-19 MED FILL — BLISOVI 24 FE TABLET: 1-20 | 84 days supply | Qty: 84 | Fill #2

## 2017-10-23 MED FILL — METFORMIN HCL ER 500 MG TAB: 500 | 30 days supply | Qty: 120 | Fill #1

## 2017-10-24 DIAGNOSIS — E1169 Type 2 diabetes mellitus with other specified complication: Secondary | ICD-10-CM | POA: Diagnosis not present

## 2017-10-24 DIAGNOSIS — Z6841 Body Mass Index (BMI) 40.0 and over, adult: Secondary | ICD-10-CM | POA: Diagnosis not present

## 2017-11-01 ENCOUNTER — Inpatient Hospital Stay: Payer: 59

## 2017-11-01 ENCOUNTER — Inpatient Hospital Stay: Payer: 59 | Attending: Genetic Counselor | Admitting: Genetic Counselor

## 2017-11-01 DIAGNOSIS — Z1501 Genetic susceptibility to malignant neoplasm of breast: Secondary | ICD-10-CM | POA: Diagnosis not present

## 2017-11-01 DIAGNOSIS — Z803 Family history of malignant neoplasm of breast: Secondary | ICD-10-CM

## 2017-11-01 DIAGNOSIS — Z7183 Encounter for nonprocreative genetic counseling: Secondary | ICD-10-CM | POA: Diagnosis not present

## 2017-11-01 DIAGNOSIS — Z1502 Genetic susceptibility to malignant neoplasm of ovary: Secondary | ICD-10-CM | POA: Diagnosis not present

## 2017-11-01 DIAGNOSIS — Z1509 Genetic susceptibility to other malignant neoplasm: Secondary | ICD-10-CM

## 2017-11-01 DIAGNOSIS — Z1589 Genetic susceptibility to other disease: Secondary | ICD-10-CM

## 2017-11-01 DIAGNOSIS — Z808 Family history of malignant neoplasm of other organs or systems: Secondary | ICD-10-CM | POA: Diagnosis not present

## 2017-11-02 ENCOUNTER — Encounter: Payer: Self-pay | Admitting: Genetic Counselor

## 2017-11-02 DIAGNOSIS — Z803 Family history of malignant neoplasm of breast: Secondary | ICD-10-CM | POA: Insufficient documentation

## 2017-11-02 DIAGNOSIS — Z1501 Genetic susceptibility to malignant neoplasm of breast: Secondary | ICD-10-CM | POA: Insufficient documentation

## 2017-11-02 DIAGNOSIS — Z1589 Genetic susceptibility to other disease: Secondary | ICD-10-CM

## 2017-11-02 DIAGNOSIS — Z1509 Genetic susceptibility to other malignant neoplasm: Secondary | ICD-10-CM

## 2017-11-02 DIAGNOSIS — Z1502 Genetic susceptibility to malignant neoplasm of ovary: Secondary | ICD-10-CM

## 2017-11-02 DIAGNOSIS — Z808 Family history of malignant neoplasm of other organs or systems: Secondary | ICD-10-CM | POA: Insufficient documentation

## 2017-11-02 NOTE — Progress Notes (Signed)
REFERRING PROVIDER: Velna Hatchet, MD [address] [place], [place] [address]  PRIMARY PROVIDER:  Velna Hatchet, MD  PRIMARY REASON FOR VISIT:  1. Family history of breast cancer   2. Family history of melanoma   3. Monoallelic mutation of CHEK2 gene in female patient      HISTORY OF PRESENT ILLNESS:   Karen Hoover, a 27 y.o. female, was seen for a [facility] cancer genetics consultation at the request of Dr. Ardeth Perfect due to a personal and family history of a CHEK2 known familial mutation called c.1100delC.  Karen Hoover presents to clinic today to discuss the possibility of a hereditary predisposition to cancer, genetic testing, and to further clarify her future cancer risks, as well as potential cancer risks for family members.   Karen Hoover is a 27 y.o. female with no personal history of cancer.  Her mother was found to have the common CHEK2 mutation called c.1100delC. She was tested through the [employer] Se[facility] test and was found to carry the same CHEK2 mutation. This visit was a discussion of that genetic test.  CANCER HISTORY:   No history exists.     HORMONAL RISK FACTORS:  Menarche was at age 68.  First live birth at age N/A.  OCP use for approximately 11 years.  Ovaries intact: yes.  Hysterectomy: no.  Menopausal status: premenopausal.  HRT use: 0 years. Colonoscopy: yes; normal. Mammogram within the last year: no. Number of breast biopsies: 0. Up to date with pelvic exams:  yes. Any excessive radiation exposure in the past:  no  Past Medical History:  Diagnosis Date  . Anemia   . Anxiety 03/17/2015  . BV (bacterial vaginosis) 04/10/2014  . Chronic lower back pain    2 herniated disc sees Dr Carloyn Manner  . Encounter for menstrual regulation 03/13/2014  . Family history of breast cancer   . Family history of melanoma   . Hemorrhoids 01/12/2015  . History of rectal bleeding 01/12/2015  . Hypothyroidism   . IC (interstitial cystitis) 01/22/2015  .  Pulmonary edema 08/21/2015   S/P back OR  . Thyroid disease   . Vaginal discharge 03/13/2014  . Vaginal itching 01/12/2015  . Vestibulitis, vulvar 04/21/2014  . Yeast infection 03/13/2014    Past Surgical History:  Procedure Laterality Date  . BACK SURGERY    . LAMINECTOMY AND MICRODISCECTOMY LUMBAR SPINE Right 08/21/2015   L4-5 and L5-S 1    Social History   Socioeconomic History  . Marital status: Single    Spouse name: Not on file  . Number of children: Not on file  . Years of education: Not on file  . Highest education level: Not on file  Occupational History  . Occupation: Surgical Occupational hygienist: [employer]  Social Needs  . Financial resource strain: Not on file  . Food insecurity:    Worry: Not on file    Inability: Not on file  . Transportation needs:    Medical: Not on file    Non-medical: Not on file  Tobacco Use  . Smoking status: Never Smoker  . Smokeless tobacco: Never Used  Substance and Sexual Activity  . Alcohol use: No  . Drug use: No  . Sexual activity: Never    Birth control/protection: Pill  Lifestyle  . Physical activity:    Days per week: Not on file    Minutes per session: Not on file  . Stress: Not on file  Relationships  .  Social connections:    Talks on phone: Not on file    Gets together: Not on file    Attends religious service: Not on file    Active member of club or organization: Not on file    Attends meetings of clubs or organizations: Not on file    Relationship status: Not on file  Other Topics Concern  . Not on file  Social History Narrative  . Not on file     FAMILY HISTORY:  We obtained a detailed, 4-generation family history.  Significant diagnoses are listed below: Family History  Problem Relation Age of Onset  . Diabetes Mother   . Thyroid disease Mother   . Breast cancer Mother 16       CHEK2 pos  . Thyroid disease Brother   . Diabetes Maternal Grandmother   . Melanoma Maternal  Grandmother        d. 55  . Melanoma Maternal Grandfather        d. 1  . Kidney cancer Other        MGF's sister  . Breast cancer Cousin 46       mother's pat first cousin    The patient does not have children.  She has a full brother and a paternal half sister who are both cancer free.  Both parents are living.  The patient's father is estranged from the family so the information on the paternal side is limited.  He has a brother and sister who are thought to be cancer free. Both paternal grandparents are deceased from unknown causes.  The patient's mother developed breast cancer at 85.  She underwent genetic testing and was found to carry a CHEK2 mutation.  She has two brothers who are cancer free.  Both of her parents are deceased.  They both had melanoma during their lifetime.  Her paternal aunt had kidney cancer and a daughter who had breast cancer at 31.  It is thought that the CHEK2 mutation is on this side of the family.  Nobody else in the family is known to have been tested for CHEK2.  Karen Hoover is aware of previous family history of genetic testing for hereditary cancer risks in her mother. Patient's maternal ancestors are of Caucasian NOS descent, and paternal ancestors are of Caucasian NOS descent. There is no reported Ashkenazi Jewish ancestry. There is no known consanguinity.  GENETIC COUNSELING ASSESSMENT: Karen Hoover is a 27 y.o. female with a family history of breast cancer and a known family mutation in Karen Hoover, for which she is positive for, which is somewhat suggestive of a hereditary cancer syndrome and predisposition to cancer. We, therefore, discussed and recommended the following at today's visit.   GENETIC TEST RESULTS: The patient underwent genetic testing at her primary care providers office.  Genetic testing through the [facility] Panel was reported out on September 21, 2017 through [employer].  This report determined that there was one  pathogenic variant identified in CHEK2 called c.1100delC.  No other deleterious variants were identified.  The VistaSeq Breast cancer panel performed through [facility] performs sequencing and/or deletion/duplication testing for the following 18 genes: ATM, BARD1, BRCA1, BRCA2, BRIP1, CDH1, CHEK2,  FAM175A, AOZ30Q, MUTYH (biallelic), NBN, NF1, PTEN, RAD50, RAD51C, RAD51D, STK11, and TP53,   DISCUSSION: CHEK2 mutations have been found to be associated with an increased risk of breast and other cancers. The estimated cancer risks vary widely and may be influenced by family history. Women with a CHEK2  deleterious mutation have approximately a 24% (no family history of breast cancer) to 48% (strong family history of breast cancer) lifetime risk of breast cancer and up to a 25% risk of a second breast cancer. Men may have an increased risk for female breast cancer of about 1%. Men and women may have an increased risk of colon cancer (~10-14% lifetime risk). According to the NCCN guidelines, individuals with CHEK2 mutations should consider breast MRI's as a part of regular breast cancer screening, and depending on family history could consider a risk-reducing mastectomy.  Breast cancer screening should begin, for women, at age 43 or 64 years younger than the earliest age of onset.  Colon cancer screening should begin at age 38 and continue every 5 years or based on polyp number.    An individual's cancer risk and medical management are not determined by genetic test results alone. Overall cancer risk assessment incorporates additional factors, including personal medical history, family history, and any available genetic information that may result in a personalized plan for cancer prevention and surveillance.   CANCER SCREENING: Below are the NCCN Practice guidelines for women and men.  However, because the breast cancer risks for women and prostate cancer risks for men may be similar, it is appropriate to  consider these high risk management recommendations.  Breast Management Options We reviewed the NCCN practice guidelines (v1.2017) for breast management for women at an increased risk of breast cancer because of CHEK2 mutations:   1. Breast awareness (which may include periodic, consistent breast self exam) starting at age 55.  2. Breast screening:  . Starting at age 86, annual mammogram and breast MRI screening, or starting 10 years younger than the earliest age of onset.  . Evidence of risk-reducing mastectomy is insufficient at this time.  [place] based on family history.  Colon Cancer Management:  Men and women with a deleterious CHEK2 mutation may have up to a 10-14% lifetime risk for colon cancer. The following is recommended for individuals with a CHEK2 mutation:  Personal history of colon cancer  Follow instructions provided by your physician based on your personal history.  Do not have a personal history of colon cancer but have a parent/sibling/child with colon cancer: Colonoscopy every 5 years starting at age 68 or 15 years younger than the earliest age of onset, whichever is younger.  Do not have a personal history of colon cancer and do not have a parent/sibling/child with colon cancer: Colonoscopy every 5 years starting at age 32.  Prostate Cancer Management: It has been suggested that men with a CHEK2 pathogenic variant and a first-degree relative with prostate cancer have an annual prostate-specific antigen (PSA) analysis (PMID: 01093235). However, the benefits of screening for prostate cancer among men with a pathogenic variant in CHEK2 are uncertain (PMID: 57322025).  Karen Hoover was offered a referral to the [facility] Risk Breast clinic to discuss high risk breast cancer screening, as well as the possibility of chemoprevention.  She accepted this referral.  She has asked for a female physician, so will be referred to Dr. Truitt Merle.  FAMILY MEMBERS: It is important that all  of Karen Hoover's relatives (both men and women) know of the presence of this gene mutation.  Women need to know that they may be at increased risk for breast and colon cancers.  Men are at slightly increased risk for breast, prostate and colon cancers.  Genetic testing can sort out who in your family is at risk and who  is not.  We would be happy to help meet with and coordinate genetic testing for any relative that is interested.  Karen Hoover's brother is at 50% risk to have inherited the mutation found in her. We recommend they have genetic testing for this same mutation, as identifying the presence of this mutation would allow them to also take advantage of risk-reducing measures.   Individuals with two copies of the CHEK2 1100delC variant (i.e., those who are homozygous) have a higher breast cancer risk compared with those with a single copy (i.e., those who are heterozygous). The lifetime risk of breast cancer in individuals homozygous for this variant is estimated to be increased four to six fold. The estimates of other cancer risks among homozygotes or compound heterozygotes involving other variants is unclear.  Our knowledge of cancer risks related to CHEK2 mutations will continue to evolve. We recommended that Karen Hoover follow up with the genetics clinic annually so we can provide her with the most current information about CHEK2 and cancer risk, as well as with any changes to her family history (new cancer diagnoses, genetic test results).  SUPPORT AND RESOURCES:  We provided information about a support group for hereditary cancer syndrome information and support, Facing Our Risk (www.facingourrisk.com) which some people have found useful.  They provide opportunities to speak with other individuals from high-risk families.    Our contact number was provided. Karen Hoover's questions were answered to her satisfaction, and she knows she is welcome to call us at anytime with additional questions  or concerns.   Roma Kayser, MS, [facility] Certified Genetic Counselor [email or web address]

## 2017-11-24 ENCOUNTER — Telehealth: Payer: Self-pay | Admitting: Hematology

## 2017-11-24 ENCOUNTER — Encounter: Payer: Self-pay | Admitting: Hematology

## 2017-11-24 NOTE — Telephone Encounter (Signed)
Pt has been scheduled for the high risk clinic on 11/7 at 230pm. Pt agreed to the appt date and time. Letter mailed.

## 2017-11-27 MED FILL — ESCITALOPRAM 20 MG TABLET: 20 | 30 days supply | Qty: 30 | Fill #7

## 2017-11-29 DIAGNOSIS — E038 Other specified hypothyroidism: Secondary | ICD-10-CM | POA: Diagnosis not present

## 2017-11-29 DIAGNOSIS — R5383 Other fatigue: Secondary | ICD-10-CM | POA: Diagnosis not present

## 2017-11-29 DIAGNOSIS — Z6841 Body Mass Index (BMI) 40.0 and over, adult: Secondary | ICD-10-CM | POA: Diagnosis not present

## 2017-11-29 DIAGNOSIS — Z315 Encounter for genetic counseling: Secondary | ICD-10-CM | POA: Diagnosis not present

## 2017-11-29 DIAGNOSIS — E1169 Type 2 diabetes mellitus with other specified complication: Secondary | ICD-10-CM | POA: Diagnosis not present

## 2017-11-29 DIAGNOSIS — R61 Generalized hyperhidrosis: Secondary | ICD-10-CM | POA: Diagnosis not present

## 2017-12-13 NOTE — Progress Notes (Signed)
[facility]  Telephone:([phone] [phone]  Clinic New Consult Note   Patient Care Team: Velna Hatchet, MD as PCP - General (Internal Medicine) 12/14/2017  Referring Physician: Dr. Ardeth Perfect  CHIEF COMPLAINTS/PURPOSE OF CONSULTATION:  Monoallelic mutation of CHEK2 gene in female patient   HISTORY OF PRESENTING ILLNESS:  Karen Hoover 27 y.o. female is here because of newly found CHEK2 mutation. She has family history of breast cancer and melanoma.   Today, she is here with her mother. She is doing well.  Mother had breast cancer at age 10 and is also CHEK2 positive. Her mother's maternal first cousin also had breast cancer age 71.   She states that she had 2 herniated discs and had back surgery 2 years ago. She has hypothyroidism and low iron, but no anemia. Her menses are regular, and she is on OCP. Menses have not been heavy since starting OCPs.  She has no personal history of malignancy.  She feels well, denies any pain or other discomfort.  She works for [employer].   MEDICAL HISTORY:  Past Medical History:  Diagnosis Date  . Anemia   . Anxiety 03/17/2015  . BV (bacterial vaginosis) 04/10/2014  . Chronic lower back pain    2 herniated disc sees Dr Carloyn Manner  . Encounter for menstrual regulation 03/13/2014  . Family history of breast cancer   . Family history of melanoma   . Hemorrhoids 01/12/2015  . History of rectal bleeding 01/12/2015  . Hypothyroidism   . IC (interstitial cystitis) 01/22/2015  . Pulmonary edema 08/21/2015   S/P back OR  . Thyroid disease   . Vaginal discharge 03/13/2014  . Vaginal itching 01/12/2015  . Vestibulitis, vulvar 04/21/2014  . Yeast infection 03/13/2014    SURGICAL HISTORY: Past Surgical History:  Procedure Laterality Date  . BACK SURGERY    . LAMINECTOMY AND MICRODISCECTOMY LUMBAR SPINE Right 08/21/2015   L4-5 and L5-S 1    SOCIAL HISTORY: Social History   Socioeconomic History  . Marital status: Single   Spouse name: Not on file  . Number of children: Not on file  . Years of education: Not on file  . Highest education level: Not on file  Occupational History  . Occupation: Surgical Occupational hygienist: [employer]  Social Needs  . Financial resource strain: Not on file  . Food insecurity:    Worry: Not on file    Inability: Not on file  . Transportation needs:    Medical: Not on file    Non-medical: Not on file  Tobacco Use  . Smoking status: Never Smoker  . Smokeless tobacco: Never Used  Substance and Sexual Activity  . Alcohol use: No    Comment: occasionally   . Drug use: No  . Sexual activity: Never    Birth control/protection: Pill  Lifestyle  . Physical activity:    Days per week: Not on file    Minutes per session: Not on file  . Stress: Not on file  Relationships  . Social connections:    Talks on phone: Not on file    Gets together: Not on file    Attends religious service: Not on file    Active member of club or organization: Not on file    Attends meetings of clubs or organizations: Not on file    Relationship status: Not on file  . Intimate partner violence:    Fear of current or ex partner: Not on  file    Emotionally abused: Not on file    Physically abused: Not on file    Forced sexual activity: Not on file  Other Topics Concern  . Not on file  Social History Narrative  . Not on file    FAMILY HISTORY: Family History  Problem Relation Age of Onset  . Diabetes Mother   . Thyroid disease Mother   . Breast cancer Mother 4       CHEK2 pos  . Thyroid disease Brother   . Diabetes Maternal Grandmother   . Melanoma Maternal Grandmother        d. 27  . Melanoma Maternal Grandfather        d. 50  . Kidney cancer Other        MGF's sister  . Breast cancer Cousin 40       mother's pat first cousin    ALLERGIES:  has No Known Allergies.  MEDICATIONS:  Current Outpatient Medications  Medication Sig Dispense Refill  .  acetaminophen (TYLENOL) 500 MG tablet Take 1,000 mg by mouth every 6 (six) hours as needed.    Marland Kitchen aspirin EC 81 MG tablet Take 81 mg by mouth daily.    Marland Kitchen BLISOVI 24 FE 1-20 MG-MCG(24) tablet TAKE 1 TABLET BY MOUTH ONCE DAILY 28 tablet 11  . escitalopram (LEXAPRO) 20 MG tablet TAKE 1 TABLET BY MOUTH ONCE DAILY 30 tablet 12  . ferrous sulfate 325 (65 FE) MG tablet Take 325 mg by mouth daily.    . fluticasone (FLONASE) 50 MCG/ACT nasal spray Place 2 sprays into both nostrils as needed.   0  . ibuprofen (ADVIL,MOTRIN) 400 MG tablet Take 400 mg by mouth daily.   2  . levothyroxine (SYNTHROID, LEVOTHROID) 50 MCG tablet Take 50 mcg by mouth daily.     . metFORMIN (GLUCOPHAGE) 500 MG tablet Take 1,000 mg by mouth 2 (two) times daily with a meal.    . methocarbamol (ROBAXIN) 500 MG tablet Take 1 tablet (500 mg total) by mouth 3 (three) times daily between meals as needed. 20 tablet 0  . pentosan polysulfate (ELMIRON) 100 MG capsule Take 2 tablets twice daily 120 capsule 11  . traMADol (ULTRAM) 50 MG tablet Take 1 tablet (50 mg total) by mouth every 6 (six) hours as needed. 6 tablet 0   No current facility-administered medications for this visit.     REVIEW OF SYSTEMS:   Constitutional: Denies fevers, chills or abnormal night sweats (+) obesity  Eyes: Denies blurriness of vision, double vision or watery eyes Ears, nose, mouth, throat, and face: Denies mucositis or sore throat Respiratory: Denies cough, dyspnea or wheezes Cardiovascular: Denies palpitation, chest discomfort or lower extremity swelling Gastrointestinal:  Denies nausea, heartburn or change in bowel habits GU: (+) menses regulated on OCPs Skin: Denies abnormal skin rashes Lymphatics: Denies new lymphadenopathy or easy bruising Neurological:Denies numbness, tingling or new weaknesses MSK: (+) history of back surgery Behavioral/Psych: Mood is stable, no new changes  All other systems were reviewed with the patient and are  negative.  PHYSICAL EXAMINATION: ECOG PERFORMANCE STATUS: 0 - Asymptomatic  Vitals:   12/14/17 1433  BP: 126/70  Pulse: 80  Resp: 18  Temp: 97.9 F (36.6 C)  SpO2: 100%   Filed Weights   12/14/17 1433  Weight: 255 lb 3.2 oz (115.8 kg)    GENERAL:alert, no distress and comfortable SKIN: skin color, texture, turgor are normal, no rashes or significant lesions EYES: normal, conjunctiva are pink and non-injected,  sclera clear OROPHARYNX:no exudate, no erythema and lips, buccal mucosa, and tongue normal  NECK: supple, thyroid normal size, non-tender, without nodularity LYMPH:  no palpable lymphadenopathy in the cervical, axillary or inguinal LUNGS: clear to auscultation and percussion with normal breathing effort HEART: regular rate & rhythm and no murmurs and no lower extremity edema ABDOMEN:abdomen soft, non-tender and normal bowel sounds Musculoskeletal:no cyanosis of digits and no clubbing  PSYCH: alert & oriented x 3 with fluent speech NEURO: no focal motor/sensory deficits Breast: no palpable masses, skin or nipple changes  LABORATORY DATA:  I have reviewed the data as listed CBC Latest Ref Rng & Units 04/24/2016 12/16/2015 11/06/2015  WBC 4.0 - 10.5 K/uL 15.3(H) 15.7(H) 14.5(H)  Hemoglobin 12.0 - 15.0 g/dL 11.7(L) 12.6 13.1  Hematocrit 36.0 - 46.0 % 34.6(L) 38.0 41.8  Platelets 150 - 400 K/uL 366 406.0(H) 368   CMP Latest Ref Rng & Units 11/06/2015 08/22/2015 04/10/2014  Glucose 65 - 99 mg/dL 165(H) 168(H) 103(H)  BUN 6 - 20 mg/dL 6 7 9   Creatinine 0.44 - 1.00 mg/dL 0.61 0.58 0.60  Sodium 135 - 145 mmol/L 138 138 139  Potassium 3.5 - 5.1 mmol/L 3.4(L) 3.9 4.1  Chloride 101 - 111 mmol/L 105 102 102  CO2 22 - 32 mmol/L 23 25 21   Calcium 8.9 - 10.3 mg/dL 9.4 9.1 9.3  Total Protein 6.0 - 8.5 g/dL - - 6.5  Total Bilirubin 0.0 - 1.2 mg/dL - - 0.5  Alkaline Phos 39 - 117 IU/L - - 65  AST 0 - 40 IU/L - - 14  ALT 0 - 32 IU/L - - 16   Pathology  11/01/2017 Genetics The  patient underwent genetic testing at her primary care providers office.  Genetic testing through the [facility] Panel was reported out on September 21, 2017 through [employer].  This report determined that there was one pathogenic variant identified in CHEK2 called c.1100delC.  No other deleterious variants were identified.  The VistaSeq Breast cancer panel performed through [facility] performs sequencing and/or deletion/duplication testing for the following 18 genes: ATM, BARD1, BRCA1, BRCA2, BRIP1, CDH1, CHEK2,  FAM175A, VOH60O, MUTYH (biallelic), NBN, NF1, PTEN, RAD50, RAD51C, RAD51D, STK11, and TP53,    RADIOGRAPHIC STUDIES: I have personally reviewed the radiological images as listed and agreed with the findings in the report. No results found.  ASSESSMENT & PLAN:  Karen Hoover is a 27 y.o. female with history of obesity and Type II DM   1. Monoallelic mutation of CHEK2 gene, 1100delC -I reviewed her genetic testing results -she has family history of breast cancer in her mother and maternal aunt. He mother has CHEK2 mutation  -We discussed that CHEK2 1100delC mutation is associated with 2-4 times high risk of breast cancer in general population, especially in people with strong family history of breast cancer.  Her estimated lifetime risk of breast cancer is probably around 30-48% -According to NCCN guidelines, annual screening breast MRI in addition to mammogram is recommended for early breast cancer detection.  For woman, it is recommended to start mammogram and breast MRI at age of 73 or early based on family history.  Her mother had breast cancer at age of 65, it is reasonable for her to start screening mammogram and breast MRI at age of 82.  -Also recommend self breast exam, and physician breast exam twice a year. I educated her to monitor herself for warning symptoms including nipple discharge or inversion, skin changes, or palpable lumps.  -  We also discussed  the other risk for breast cancer, such as late pregnancy, or never pregnant, obesity, postmenopausal hormonal replacement, etc. I educated her about importance of maintaining a healthy diet, exercising, and losing weight. I also encouraged her to breast-feed her children in the future.  -I discussed chemoprevention with Tamoxifen. I do not recommend starting Tamoxifen due to her genetic related high risk, which we do not have sufficient data.  -We also discussed the increased risk of colon cancer from CHEK2 mutation.  She has had a colonoscopy in 2017 for diarrhea and hematochezia, which was normal.  She will continue screening colonoscopy every 5-10 years  --I advised her to f/u with her OB/GYN for breast exams, pap smears and checkups.  -f/u with me open  2. Genetics The patient underwent genetic testing at her primary care providers office.  Genetic testing through the [facility] Panel was reported out on September 21, 2017 through [employer].  This report determined that there was one pathogenic variant identified in CHEK2 called c.1100delC.  No other deleterious variants were identified.  The VistaSeq Breast cancer panel performed through [facility] performs sequencing and/or deletion/duplication testing for the following 18 genes: ATM, BARD1, BRCA1, BRCA2, BRIP1, CDH1, CHEK2,  FAM175A, RAF42D, MUTYH (biallelic), NBN, NF1, PTEN, RAD50, RAD51C, RAD51D, STK11, and TP53,   3. DM -continue medication and f/u with PCP   4. Obesity -I encouraged her to lose weight  Plan: -She will continue follow-up with her gynecologist -she will start annual screening mammogram and breast MRI at age of 61, or no later than 2.  -I will see her as needed   No orders of the defined types were placed in this encounter.   All questions were answered. The patient knows to call the clinic with any problems, questions or concerns. I spent 35 minutes counseling the patient face to face. The  total time spent in the appointment was 45 minutes and more than 50% was on counseling.  Dierdre Searles Dweik am acting as scribe for Dr. Truitt Merle.  I have reviewed the above documentation for accuracy and completeness, and I agree with the above.      Truitt Merle, MD 12/14/2017 3:38 PM

## 2017-12-14 ENCOUNTER — Encounter: Payer: Self-pay | Admitting: Hematology

## 2017-12-14 ENCOUNTER — Inpatient Hospital Stay: Payer: 59 | Attending: Genetic Counselor | Admitting: Hematology

## 2017-12-14 VITALS — BP 126/70 | HR 80 | Temp 97.9°F | Resp 18 | Ht 64.0 in | Wt 255.2 lb

## 2017-12-14 DIAGNOSIS — Z803 Family history of malignant neoplasm of breast: Secondary | ICD-10-CM

## 2017-12-14 DIAGNOSIS — E119 Type 2 diabetes mellitus without complications: Secondary | ICD-10-CM

## 2017-12-14 DIAGNOSIS — E669 Obesity, unspecified: Secondary | ICD-10-CM | POA: Diagnosis not present

## 2017-12-14 DIAGNOSIS — Z1501 Genetic susceptibility to malignant neoplasm of breast: Secondary | ICD-10-CM | POA: Diagnosis not present

## 2017-12-14 DIAGNOSIS — Z1589 Genetic susceptibility to other disease: Secondary | ICD-10-CM

## 2017-12-14 DIAGNOSIS — Z1502 Genetic susceptibility to malignant neoplasm of ovary: Secondary | ICD-10-CM

## 2017-12-14 DIAGNOSIS — Z1509 Genetic susceptibility to other malignant neoplasm: Secondary | ICD-10-CM

## 2017-12-14 DIAGNOSIS — E039 Hypothyroidism, unspecified: Secondary | ICD-10-CM | POA: Diagnosis not present

## 2017-12-15 ENCOUNTER — Telehealth: Payer: Self-pay | Admitting: Hematology

## 2017-12-15 NOTE — Telephone Encounter (Signed)
No los -1/17  

## 2017-12-16 ENCOUNTER — Encounter: Payer: Self-pay | Admitting: Hematology

## 2018-01-01 DIAGNOSIS — M5416 Radiculopathy, lumbar region: Secondary | ICD-10-CM | POA: Diagnosis not present

## 2018-01-01 MED FILL — BLISOVI 24 FE TABLET: 1-20 | 84 days supply | Qty: 84 | Fill #3

## 2018-01-01 MED FILL — metFORMIN HCL ER 500 MG TB2: 500 | 30 days supply | Qty: 120 | Fill #2

## 2018-01-01 MED FILL — ESCITALOPRAM 20 MG TABLET: 20 | 30 days supply | Qty: 30 | Fill #8

## 2018-01-08 MED FILL — HYDROCODON-APAP 5-325: 5-325 | 10 days supply | Qty: 30 | Fill #0

## 2018-01-23 ENCOUNTER — Ambulatory Visit (INDEPENDENT_AMBULATORY_CARE_PROVIDER_SITE_OTHER): Payer: 59 | Admitting: Neurology

## 2018-01-23 ENCOUNTER — Encounter: Payer: Self-pay | Admitting: Neurology

## 2018-01-23 VITALS — BP 146/97 | HR 79 | Ht 65.0 in | Wt 257.0 lb

## 2018-01-23 DIAGNOSIS — R400 Somnolence: Secondary | ICD-10-CM

## 2018-01-23 DIAGNOSIS — Z9189 Other specified personal risk factors, not elsewhere classified: Secondary | ICD-10-CM

## 2018-01-23 DIAGNOSIS — Z6841 Body Mass Index (BMI) 40.0 and over, adult: Secondary | ICD-10-CM | POA: Diagnosis not present

## 2018-01-23 DIAGNOSIS — R0681 Apnea, not elsewhere classified: Secondary | ICD-10-CM | POA: Diagnosis not present

## 2018-01-23 DIAGNOSIS — R0683 Snoring: Secondary | ICD-10-CM

## 2018-01-23 MED FILL — LEVOTHYROXINE 50 MCG TABLET: 50 | 90 days supply | Qty: 90 | Fill #2

## 2018-01-23 NOTE — Progress Notes (Signed)
Subjective:    Patient ID: Karen Hoover is a 27 y.o. female.  HPI     Star Age, MD, PhD [facility] [address], [address], [place] [address]  Dear Dr. Ardeth Hoover,   Patient, Karen Hoover, upon your kind request in the sleep clinic today for initial consultation of her sleep disorder, in particular, concern for underlying obstructive sleep apnea. The patient is unaccompanied today. As you know, [name] is a 27 year old right-handed woman with an underlying medical history of interstitial cystitis, iron deficiency anemia, chronic back pain, hypothyroidism, and morbid obesity with a BMI of over 40, who reports snoring and excessive daytime somnolence. I reviewed your office note from 11/29/2017, which you kindly included. Her mom has witnessed the occasional breathing pulse while she is asleep. She has no known family history of OSA. 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 She tries to make enough time for sleep, Epworth sleepiness score is high at 19 out of 24 today, fatigue scores 42 out of 63. Typical bedtime is between 6 and 7 PM as she has an early rise time of 4 AM. She does have a 40 minute comminuted admits that she gets sleepy while driving sometimes and has even just off at the wheel. She is aware of the risk of dozing off at the wheel. She has had trouble losing weight. She was seeing a nutritionist for about 3 months and was able to lose about 10 pounds during that time but gained some of it back. She is a nonsmoker and takes alcohol occasionally, drinks caffeine in the form of soda, 2-3 per day on average, also uses caffeine to help stay awake. She does not watch TV in her bedroom, they have 1 cat in the household, she does not have night to night nocturia, denies recurrent morning headaches. She does not have preferred sleep position, she does toss and turn at night. Her sleepiness has become worse  over the past several months, likely over this past year. She has not been a sleepy child per se.  Her Past Medical History Is Significant For: Past Medical History:  Diagnosis Date  . Anemia   . Anxiety 03/17/2015  . BV (bacterial vaginosis) 04/10/2014  . Chronic lower back pain    2 herniated disc sees Dr Karen Hoover  . Encounter for menstrual regulation 03/13/2014  . Family history of breast cancer   . Family history of melanoma   . Hemorrhoids 01/12/2015  . History of rectal bleeding 01/12/2015  . Hypothyroidism   . IC (interstitial cystitis) 01/22/2015  . Pulmonary edema 08/21/2015   S/P back OR  . Thyroid disease   . Vaginal discharge 03/13/2014  . Vaginal itching 01/12/2015  . Vestibulitis, vulvar 04/21/2014  . Yeast infection 03/13/2014    Her Past Surgical History Is Significant For: Past Surgical History:  Procedure Laterality Date  . BACK SURGERY    . LAMINECTOMY AND MICRODISCECTOMY LUMBAR SPINE Right 08/21/2015   L4-5 and L5-S 1    Her Family History Is Significant For: Family History  Problem Relation Age of Onset  . Diabetes Mother   . Thyroid disease Mother   . Breast cancer Mother 69       CHEK2 pos  . Thyroid disease Brother   . Diabetes Maternal Grandmother   . Melanoma Maternal Grandmother        d. 69  . Melanoma Maternal Grandfather  d. 81  . Kidney cancer Other        MGF's sister  . Breast cancer Cousin 32       mother's pat first cousin    Her Social History Is Significant For: Social History   Socioeconomic History  . Marital status: Single    Spouse name: Not on file  . Number of children: Not on file  . Years of education: Not on file  . Highest education level: Not on file  Occupational History  . Occupation: Surgical Occupational hygienist: [employer]  Social Needs  . Financial resource strain: Not on file  . Food insecurity:    Worry: Not on file    Inability: Not on file  . Transportation needs:    Medical: Not on  file    Non-medical: Not on file  Tobacco Use  . Smoking status: Never Smoker  . Smokeless tobacco: Never Used  Substance and Sexual Activity  . Alcohol use: No    Comment: occasionally   . Drug use: No  . Sexual activity: Never    Birth control/protection: Pill  Lifestyle  . Physical activity:    Days per week: Not on file    Minutes per session: Not on file  . Stress: Not on file  Relationships  . Social connections:    Talks on phone: Not on file    Gets together: Not on file    Attends religious service: Not on file    Active member of club or organization: Not on file    Attends meetings of clubs or organizations: Not on file    Relationship status: Not on file  Other Topics Concern  . Not on file  Social History Narrative  . Not on file    Her Allergies Are:  No Known Allergies:   Her Current Medications Are:  Outpatient Encounter Medications as of 01/23/2018  Medication Sig  . acetaminophen (TYLENOL) 500 MG tablet Take 1,000 mg by mouth every 6 (six) hours as needed.  Marland Kitchen aspirin EC 81 MG tablet Take 81 mg by mouth daily.  Marland Kitchen BLISOVI 24 FE 1-20 MG-MCG(24) tablet TAKE 1 TABLET BY MOUTH ONCE DAILY  . escitalopram (LEXAPRO) 20 MG tablet TAKE 1 TABLET BY MOUTH ONCE DAILY  . ferrous sulfate 325 (65 FE) MG tablet Take 325 mg by mouth daily.  . fluticasone (FLONASE) 50 MCG/ACT nasal spray Place 2 sprays into both nostrils as needed.   Marland Kitchen HYDROcodone-acetaminophen (NORCO/VICODIN) 5-325 MG tablet Take 1 tablet by mouth every 6 (six) hours as needed for moderate pain.  Marland Kitchen ibuprofen (ADVIL,MOTRIN) 400 MG tablet Take 400 mg by mouth daily.   Marland Kitchen levothyroxine (SYNTHROID, LEVOTHROID) 50 MCG tablet Take 50 mcg by mouth daily.   . metFORMIN (GLUCOPHAGE) 500 MG tablet Take 1,000 mg by mouth 2 (two) times daily with a meal.  . methocarbamol (ROBAXIN) 500 MG tablet Take 1 tablet (500 mg total) by mouth 3 (three) times daily between meals as needed.  . traMADol (ULTRAM) 50 MG tablet Take  1 tablet (50 mg total) by mouth every 6 (six) hours as needed.  . [DISCONTINUED] pentosan polysulfate (ELMIRON) 100 MG capsule Take 2 tablets twice daily   No facility-administered encounter medications on file as of 01/23/2018.   :  Review of Systems:  Out of a complete 14 point review of systems, all are reviewed and negative with the exception of these symptoms as listed below:  Review of Systems  Neurological:       Pt presents today to discuss her sleep. Pt has never had a sleep study but does endorse snoring.  Epworth Sleepiness Scale 0= would never doze 1= slight chance of dozing 2= moderate chance of dozing 3= high chance of dozing  Sitting and reading: 3 Watching TV: 2 Sitting inactive in a public place (ex. Theater or meeting): 3 As a passenger in a car for an hour without a break: 2 Lying down to rest in the afternoon: 3 Sitting and talking to someone: 2 Sitting quietly after lunch (no alcohol): 2 In a car, while stopped in traffic: 2 Total: 19     Objective:  Neurological Exam  Physical Exam Physical Examination:   Vitals:   01/23/18 1102  BP: (!) 146/97  Pulse: 79   General Examination: The patient is a very pleasant 27 y.o. female in no acute distress. She appears well-developed and well-nourished and well groomed.   HEENT: Normocephalic, atraumatic, pupils are equal, round and reactive to light and accommodation. Extraocular tracking is good without limitation to gaze excursion or nystagmus noted. Normal smooth pursuit is noted. Hearing is grossly intact. Face is symmetric with normal facial animation and normal facial sensation. Speech is clear with no dysarthria noted. There is no hypophonia. There is no lip, neck/head, jaw or voice tremor. Neck is supple with full range of passive and active motion. There are no carotid bruits on auscultation. Oropharynx exam reveals: mild mouth dryness, good dental hygiene and marked airway crowding, due to  larger tonsils of 3+ b/l, larger uvula, smaller airway entry. Mallampati is class III. Tongue protrudes centrally and palate elevates symmetrically. Neck size is 15 7/8 inches. She has a minimal overbite. Nasal inspection reveals no significant nasal mucosal bogginess or redness and no septal deviation, but narrow nasal passages.   Chest: Clear to auscultation without wheezing, rhonchi or crackles noted.  Heart: S1+S2+0, regular and normal without murmurs, rubs or gallops noted.   Abdomen: Soft, non-tender and non-distended with normal bowel sounds appreciated on auscultation.  Extremities: There is no pitting edema in the distal lower extremities bilaterally. Pedal pulses are intact.  Skin: Warm and dry without trophic changes noted.  Musculoskeletal: exam reveals no obvious joint deformities, tenderness or joint swelling or erythema.   Neurologically:  Mental status: The patient is awake, alert and oriented in all 4 spheres. Her immediate and remote memory, attention, language skills and fund of knowledge are appropriate. There is no evidence of aphasia, agnosia, apraxia or anomia. Speech is clear with normal prosody and enunciation. Thought process is linear. Mood is normal and affect is normal.  Cranial nerves II - XII are as described above under HEENT exam. In addition: shoulder shrug is normal with equal shoulder height noted. Motor exam: Normal bulk, strength and tone is noted. There is no drift, tremor or rebound. Romberg is negative. Fine motor skills and coordination: intact with normal finger taps, normal hand movements, normal rapid alternating patting, normal foot taps and normal foot agility.  Cerebellar testing: No dysmetria or intention tremor on finger to nose testing. Heel to shin is unremarkable bilaterally. There is no truncal or gait ataxia.  Sensory exam: intact to light touch in the upper and lower extremities.  Gait, station and balance: She stands easily. No veering to  one side is noted. No leaning to one side is noted. Posture is age-appropriate and stance is narrow based. Gait shows normal stride length and normal  pace. No problems turning are noted. Tandem walk is unremarkable.    Assessment and plan:  In summary, Lariyah Shetterly is a very pleasant 27 y.o.-year old female  with an underlying medical history of interstitial cystitis, iron deficiency anemia, chronic back pain, hypothyroidism, and morbid obesity with a BMI of over 40, whose history and physical exam are concerning for obstructive sleep apnea (OSA). I had a long chat with the patient about my findings and the diagnosis of OSA, its prognosis and treatment options. We talked about medical treatments, surgical interventions and non-pharmacological approaches. I explained in particular the risks and ramifications of untreated moderate to severe OSA, especially with respect to developing cardiovascular disease down the Road, including congestive heart failure, difficult to treat hypertension, cardiac arrhythmias, or stroke. Even type 2 diabetes has, in part, been linked to untreated OSA. Symptoms of untreated OSA include daytime sleepiness, memory problems, mood irritability and mood disorder such as depression and anxiety, lack of energy, as well as recurrent headaches, especially morning headaches. We talked about trying to maintain a healthy lifestyle in general, as well as the importance of weight control. I encouraged the patient to eat healthy, exercise daily and keep well hydrated, to keep a scheduled bedtime and wake time routine, to not skip any meals and eat healthy snacks in between meals. I strongly advised the patient  not to drive when feeling sleepy.  I recommended the following at this time: sleep study with potential positive airway pressure titration. (We will score hypopneas at 3%).   I explained the sleep test procedure to the patient and also outlined possible surgical and non-surgical  treatment options of OSA, including the use of a custom-made dental device (which would require a referral to a specialist dentist or oral surgeon), upper airway surgical options, such as pillar implants, radiofrequency surgery, tongue base surgery, and UPPP (which would involve a referral to an ENT surgeon). Rarely, jaw surgery such as mandibular advancement may be considered.  I also explained the CPAP treatment option to the patient, who indicated that she would be willing to try CPAP if the need arises. I explained the importance of being compliant with PAP treatment, not only for insurance purposes but primarily to improve Her symptoms, and for the patient's long term health benefit, including to reduce Her cardiovascular risks. I answered all her questions today and the patient was in agreement. I plan to see her back after the sleep study is completed and encouraged her to call with any interim questions, concerns, problems or updates.   Thank you very much for allowing me to participate in the care of this nice patient. If I can be of any further assistance to you please do not hesitate to call me at [phone].  Sincerely,   Star Age, MD, PhD

## 2018-01-23 NOTE — Patient Instructions (Signed)

## 2018-01-26 DIAGNOSIS — M5416 Radiculopathy, lumbar region: Secondary | ICD-10-CM | POA: Diagnosis not present

## 2018-01-26 DIAGNOSIS — M545 Low back pain: Secondary | ICD-10-CM | POA: Diagnosis not present

## 2018-01-26 DIAGNOSIS — Z6841 Body Mass Index (BMI) 40.0 and over, adult: Secondary | ICD-10-CM | POA: Diagnosis not present

## 2018-01-26 MED FILL — traMADol HCL 50 MG TABS: 50 | 15 days supply | Qty: 60 | Fill #0

## 2018-01-26 MED FILL — GABAPENTIN 300 MG CAPSULE: 300 | 30 days supply | Qty: 30 | Fill #0

## 2018-01-26 MED FILL — METHOCARBAMOL 500 MG TABLET: 500 | 30 days supply | Qty: 60 | Fill #0

## 2018-02-05 MED FILL — ESCITALOPRAM 20 MG TABLET: 20 | 30 days supply | Qty: 30 | Fill #9

## 2018-02-26 DIAGNOSIS — M5416 Radiculopathy, lumbar region: Secondary | ICD-10-CM | POA: Diagnosis not present

## 2018-02-26 MED FILL — metFORMIN HCL ER 500 MG TB2: 500 | 30 days supply | Qty: 120 | Fill #3

## 2018-02-26 MED FILL — GABAPENTIN 300 MG CAPSULE: 300 | 30 days supply | Qty: 30 | Fill #1

## 2018-02-27 ENCOUNTER — Ambulatory Visit (INDEPENDENT_AMBULATORY_CARE_PROVIDER_SITE_OTHER): Payer: 59 | Admitting: Neurology

## 2018-02-27 DIAGNOSIS — G4720 Circadian rhythm sleep disorder, unspecified type: Secondary | ICD-10-CM

## 2018-02-27 DIAGNOSIS — R400 Somnolence: Secondary | ICD-10-CM

## 2018-02-27 DIAGNOSIS — G4731 Primary central sleep apnea: Secondary | ICD-10-CM

## 2018-02-27 DIAGNOSIS — Z9189 Other specified personal risk factors, not elsewhere classified: Secondary | ICD-10-CM

## 2018-02-27 DIAGNOSIS — R0683 Snoring: Secondary | ICD-10-CM

## 2018-02-27 DIAGNOSIS — Z6841 Body Mass Index (BMI) 40.0 and over, adult: Secondary | ICD-10-CM

## 2018-03-06 ENCOUNTER — Telehealth: Payer: Self-pay

## 2018-03-06 NOTE — Telephone Encounter (Signed)
I called pt. I advised pt that Dr. Frances Furbish reviewed their sleep study results and found that pt has central sleep apnea and recommends that pt be treated with a cpap/ bipap. Pt may be at risk for CSA due to her narcotic pain meds. Dr. Frances Furbish recommends that pt return for a repeat sleep study in order to properly titrate the cpap and ensure a good mask fit. Pt is agreeable to returning for a titration study. I advised pt that our sleep lab will file with pt's insurance and call pt to schedule the sleep study when we hear back from the pt's insurance regarding coverage of this sleep study. Pt verbalized understanding of results. Pt had no questions at this time but was encouraged to call back if questions arise.

## 2018-03-06 NOTE — Progress Notes (Signed)
Patient referred by PCP, seen by me on 01/23/18, diagnostic PSG on 02/27/18.   Please call and notify the patient that the recent sleep study showed central sleep apnea, not obstructive sleep apnea. She may be at risk for CSA d/t narcotic pain meds. I recommend treatment attempt with PAP, likely BiPAP, not CPAP. This will require a repeat sleep study for proper titration and mask fitting and correct monitoring of the oxygen saturations. Please explain to patient. I have placed an order in the chart. Thanks.  Huston Foley, MD, PhD [facility])

## 2018-03-06 NOTE — Telephone Encounter (Signed)
-----   Message from Huston Foley, MD sent at 03/06/2018  8:08 AM EST ----- Patient referred by PCP, seen by me on 01/23/18, diagnostic PSG on 02/27/18.   Please call and notify the patient that the recent sleep study showed central sleep apnea, not obstructive sleep apnea. She may be at risk for CSA d/t narcotic pain meds. I recommend treatment attempt with PAP, likely BiPAP, not CPAP. This will require a repeat sleep study for proper titration and mask fitting and correct monitoring of the oxygen saturations. Please explain to patient. I have placed an order in the chart. Thanks.  Huston Foley, MD, PhD [facility])

## 2018-03-06 NOTE — Procedures (Signed)
PATIENT'S NAME:  Karen Hoover, Karen Hoover DOB:      [birth year]      MR#:    [id number]     DATE OF RECORDING: 02/27/2018 REFERRING M.D.:  Alysia Penna, MD Study Performed:   Baseline Polysomnogram HISTORY: 28 year old woman with a history of interstitial cystitis, iron deficiency anemia, chronic back pain, hypothyroidism, and morbid obesity with a BMI of over 40, who reports snoring and excessive daytime somnolence. The patient endorsed the Epworth Sleepiness Scale at 19 points. The patient's weight 258 pounds with a height of 65 (inches), resulting in a BMI of 43. kg/m2. The patient's neck circumference measured 15.8 inches.  CURRENT MEDICATIONS: Tylenol, Aspirin, Lexapro, Flonase, Norco, Advil, Synthroid, Metformin, Robaxin, Ultram   PROCEDURE:  This is a multichannel digital polysomnogram utilizing the Somnostar 11.2 system.  Electrodes and sensors were applied and monitored per AASM Specifications.   EEG, EOG, Chin and Limb EMG, were sampled at 200 Hz.  ECG, Snore and Nasal Pressure, Thermal Airflow, Respiratory Effort, CPAP Flow and Pressure, Oximetry was sampled at 50 Hz. Digital video and audio were recorded.      BASELINE STUDY  Lights Out was at 22:00 and Lights On at 05:00.  Total recording time (TRT) was 420.5 minutes, with a total sleep time (TST) of 396 minutes. The patient's sleep latency was 3 minutes.  REM latency was 139.5 minutes, which is delayed. The sleep efficiency was 94.2 %.     SLEEP ARCHITECTURE: WASO (Wake after sleep onset) was 21 minutes with mild to moderate sleep fragmentation noted. There were 50 minutes in Stage N1, 202 minutes Stage N2, 98 minutes Stage N3 and 46 minutes in Stage REM.  The percentage of Stage N1 was 12.6%, which is increased, Stage N2 was 51.%, which is normal, Stage N3 was 24.7% and Stage R (REM sleep) was 11.6%, which is reduced. The arousals were noted as: 64 were spontaneous, 0 were associated with PLMs, 13 were associated with respiratory events.    RESPIRATORY ANALYSIS:  There were a total of 77 respiratory events:  0 obstructive apneas, 74 central apneas and 0 mixed apneas with a total of 74 apneas and an apnea index (AI) of 11.2 /hour. There were 3 hypopneas with a hypopnea index of .5 /hour. The patient also had 0 respiratory event related arousals (RERAs).      The total APNEA/HYPOPNEA INDEX (AHI) was 11.7 /hour and the total RESPIRATORY DISTURBANCE INDEX was 11.7 /hour.  1 events occurred in REM sleep and 79 events in NREM. The REM AHI was 0 /hour, versus a non-REM AHI of 13.2. The patient spent 225 minutes of total sleep time in the supine position and 171 minutes in non-supine.. The supine AHI was 9.8 versus a non-supine AHI of 14.1.  OXYGEN SATURATION & C02:  The Wake baseline 02 saturation was 98%, with the lowest being 82%. Time spent below 89% saturation equaled 6 minutes. PERIODIC LIMB MOVEMENTS: The patient had a total of 0 Periodic Limb Movements.  The Periodic Limb Movement (PLM) index was 0 and the PLM Arousal index was 0/hour.  Audio and video analysis did not show any abnormal or unusual movements, behaviors, phonations or vocalizations. The patient took no bathroom breaks. Intermittent mild to moderate snoring was noted. The EKG was in keeping with normal sinus rhythm (NSR).  Post-study, the patient indicated that sleep was the same as usual.   IMPRESSION:  1. Primary Central Sleep Apnea (CSA) 2. Dysfunctions associated with sleep stages or arousals from sleep  RECOMMENDATIONS:  1. This study demonstrates central sleep apnea in the mild range, no evidence of obstructive sleep apnea. Given the complaint of significant daytime somnolence, treatment with positive airway pressure can be tried; she may not respond to CPAP, but may need BiPAP or BiPAP ST. The use of narcotic pain medication may put her at risk for central respiratory events; the use of opioid pain medication should be reviewed and limited, as much as possible  and medically feasible. Weight loss will likely aid in reducing her snoring.  2. This study shows sleep fragmentation and abnormal sleep stage percentages; these are nonspecific findings and per se do not signify an intrinsic sleep disorder or a cause for the patient's sleep-related symptoms. Causes include (but are not limited to) the first night effect of the sleep study, circadian rhythm disturbances, medication effect or an underlying mood disorder or medical problem.  3. The patient should be cautioned not to drive, work at heights, or operate dangerous or heavy equipment when tired or sleepy. Review and reiteration of good sleep hygiene measures should be pursued with any patient. 4. The patient will be seen in follow-up in the sleep clinic at [facility] for discussion of the test results, symptom and treatment compliance review, further management strategies, etc. The referring provider will be notified of the test results.  I certify that I have reviewed the entire raw data recording prior to the issuance of this report in accordance with the Standards of Accreditation of the American Academy of Sleep Medicine (AASM)    Huston Foley, MD, PhD Diplomat, American Board of Neurology and Sleep Medicine (Neurology and Sleep Medicine)

## 2018-03-06 NOTE — Addendum Note (Signed)
Addended by: Huston Foley on: 03/06/2018 08:08 AM   Modules accepted: Orders

## 2018-03-07 MED FILL — ESCITALOPRAM 20 MG TABLET: 20 | 30 days supply | Qty: 30 | Fill #10

## 2018-03-14 DIAGNOSIS — Z6841 Body Mass Index (BMI) 40.0 and over, adult: Secondary | ICD-10-CM | POA: Diagnosis not present

## 2018-03-14 DIAGNOSIS — E1169 Type 2 diabetes mellitus with other specified complication: Secondary | ICD-10-CM | POA: Diagnosis not present

## 2018-03-28 ENCOUNTER — Other Ambulatory Visit: Payer: Self-pay | Admitting: Neurosurgery

## 2018-03-28 ENCOUNTER — Other Ambulatory Visit (HOSPITAL_COMMUNITY): Payer: Self-pay | Admitting: Neurosurgery

## 2018-03-28 DIAGNOSIS — M5416 Radiculopathy, lumbar region: Secondary | ICD-10-CM

## 2018-04-01 MED FILL — metFORMIN HCL ER 500 MG TB2: 500 | 30 days supply | Qty: 120 | Fill #4

## 2018-04-02 ENCOUNTER — Other Ambulatory Visit: Payer: Self-pay | Admitting: Adult Health

## 2018-04-02 MED FILL — BLISOVI 24 FE 1-20 MG-MCG(2: 1-20 | 84 days supply | Qty: 84 | Fill #0

## 2018-04-02 MED FILL — GABAPENTIN 300 MG CAPSULE: 300 | 30 days supply | Qty: 30 | Fill #0

## 2018-04-03 ENCOUNTER — Ambulatory Visit (INDEPENDENT_AMBULATORY_CARE_PROVIDER_SITE_OTHER): Payer: 59 | Admitting: Neurology

## 2018-04-03 DIAGNOSIS — R400 Somnolence: Secondary | ICD-10-CM

## 2018-04-03 DIAGNOSIS — G4731 Primary central sleep apnea: Secondary | ICD-10-CM | POA: Diagnosis not present

## 2018-04-03 DIAGNOSIS — Z6841 Body Mass Index (BMI) 40.0 and over, adult: Secondary | ICD-10-CM

## 2018-04-03 DIAGNOSIS — G4720 Circadian rhythm sleep disorder, unspecified type: Secondary | ICD-10-CM

## 2018-04-03 DIAGNOSIS — R0683 Snoring: Secondary | ICD-10-CM

## 2018-04-04 MED FILL — ESCITALOPRAM 20 MG TABLET: 20 | 30 days supply | Qty: 30 | Fill #11

## 2018-04-05 ENCOUNTER — Telehealth: Payer: Self-pay

## 2018-04-05 NOTE — Addendum Note (Signed)
Addended by: Huston Foley on: 04/05/2018 08:22 AM   Modules accepted: Orders

## 2018-04-05 NOTE — Procedures (Signed)
S PATIENT'S NAME:  Karen Hoover, Laur DOB:      [birth year]      MR#:    [id number]     DATE OF RECORDING: 04/03/2018 REFERRING M.D.:  Alysia Penna, MD Study Performed:   CPAP  Titration HISTORY:  28 year old woman with a history of interstitial cystitis, iron deficiency anemia, chronic back pain, hypothyroidism, and morbid obesity with a BMI of over 40, who returns for a PAP titration study. Her baseline PSG performed on 02/27/18 showed primarily central sleep apnea, with an AHI of 11.7 and low spo2 of 82%. The patient endorsed the Epworth Sleepiness Scale at 19 points. The patient's weight 258 pounds with a height of 65 (inches), resulting in a BMI of 43. kg/m2. The patient's neck circumference measured 15 inches.  CURRENT MEDICATIONS: Tylenol, Aspirin, Lexapro, Flonase, Norco, Advil, Synthroid, Metformin, Robaxin, Ultram.  PROCEDURE:  This is a multichannel digital polysomnogram utilizing the SomnoStar 11.2 system.  Electrodes and sensors were applied and monitored per AASM Specifications.   EEG, EOG, Chin and Limb EMG, were sampled at 200 Hz.  ECG, Snore and Nasal Pressure, Thermal Airflow, Respiratory Effort, CPAP Flow and Pressure, Oximetry was sampled at 50 Hz. Digital video and audio were recorded.      The patient was fitted with an XS Airfit P10 nasal pillows interface. She was started on CPAP at 5 cmH20 with heated humidity per AASM standards and pressure was advanced to 6 cm. She was noted to have central respiratory events and was switched to BiPAP of 9/5 cmH20, but had ongoing central hypopneas, which did not improve with BiPAP ST of 9/5 with a rate of 12. She was switched back to CPAP of 7 cm and gradually titrated to a pressure of 9 cmH20, at which point her AHI was 1.8/hour with brief supine REM sleep achieved and O2 nadir of 90%.   Lights Out was at 21:34 and Lights On at 04:59. Total recording time (TRT) was 446 minutes, with a total sleep time (TST) of 418.5 minutes. The patient's sleep  latency was 14.5 minutes. REM latency was 122.5 minutes, which is mildly delayed. The sleep efficiency was 93.8%.    SLEEP ARCHITECTURE: WASO (Wake after sleep onset) was 22.5 minutes with mild to moderate sleep fragmentation noted, especially, when she was on BiPAP ST.  There were 35 minutes in Stage N1, 170.5 minutes Stage N2, 149 minutes Stage N3 and 64 minutes in Stage REM.  The percentage of Stage N1 was 8.4%, Stage N2 was 40.7%, Stage N3 was 35.6% which is increased, and Stage R (REM sleep) was 15.3%, which is reduced. The arousals were noted as: 44 were spontaneous, 3 were associated with PLMs, 20 were associated with respiratory events.  RESPIRATORY ANALYSIS:  There was a total of 101 respiratory events: 0 obstructive apneas, 82 central apneas and 1 mixed apneas with a total of 83 apneas and an apnea index (AI) of 11.9 /hour. There were 18 hypopneas with a hypopnea index of 2.6/hour. The patient also had 0 respiratory event related arousals (RERAs).      The total APNEA/HYPOPNEA INDEX  (AHI) was 14.5 /hour and the total RESPIRATORY DISTURBANCE INDEX was 14.5 /hour  2 events occurred in REM sleep and 99 events in NREM. The REM AHI was 1.9 /hour versus a non-REM AHI of 16.8 /hour.  The patient spent 75.5 minutes of total sleep time in the supine position and 343 minutes in non-supine. The supine AHI was 24.6, versus a non-supine AHI of 12.2.  OXYGEN SATURATION & C02:  The baseline 02 saturation was 97%, with the lowest being 87%. Time spent below 89% saturation equaled 2 minutes.  PERIODIC LIMB MOVEMENTS: The patient had a total of 6 Periodic Limb Movements. The Periodic Limb Movement (PLM) index was .9 and the PLM Arousal index was .4 /hour.  Audio and video analysis did not show any abnormal or unusual movements, behaviors, phonations or vocalizations. The patient took no bathroom breaks. The EKG was in keeping with normal sinus rhythm (NSR).  Post-study, the patient indicated that sleep was  the same as usual.  IMPRESSION: 1. Primary Central Sleep Apnea (CSA) 2. Dysfunctions associated with sleep stages or arousals from sleep    RECOMMENDATIONS: 1. This study demonstrates eventual reduction of the patient's central sleep apnea with standard CPAP therapy. Interestingly, she did not respond favorably on BiPAP or BiPAP ST. I will start the patient on home CPAP treatment at a pressure of 9 cm via XS nasal pillows with heated humidity. The patient should be reminded to be fully compliant with PAP therapy to improve sleep related symptoms and decrease long term cardiovascular risks. The patient should be reminded, that it may take up to 3 months to get fully used to using PAP with all planned sleep. The earlier full compliance is achieved, the better long term compliance tends to be. Please note that untreated sleep apnea may carry additional perioperative morbidity. Patients with significant obstructive or central lsleep apnea should receive perioperative PAP therapy and the surgeons and particularly the anesthesiologist should be informed of the diagnosis and the severity of the sleep disordered breathing. 2. This study shows sleep fragmentation and abnormal sleep stage percentages; these are nonspecific findings and per se do not signify an intrinsic sleep disorder or a cause for the patient's sleep-related symptoms. Causes include (but are not limited to) the first night effect of the sleep study, circadian rhythm disturbances, medication effect or an underlying mood disorder or medical problem.  3. The patient should be cautioned not to drive, work at heights, or operate dangerous or heavy equipment when tired or sleepy. Review and reiteration of good sleep hygiene measures should be pursued with any patient. 4. The patient will be seen in follow-up in the sleep clinic at [facility] for discussion of the test results, symptom and treatment compliance review, further management strategies, etc. The  referring provider will be notified of the test results.   I certify that I have reviewed the entire raw data recording prior to the issuance of this report in accordance with the Standards of Accreditation of the American Academy of Sleep Medicine (AASM)    Huston Foley, MD, PhD Diplomat, American Board of Neurology and Sleep Medicine (Neurology and Sleep Medicine)

## 2018-04-05 NOTE — Telephone Encounter (Signed)
I called pt to discuss her sleep study results. No answer, left a message asking her to call me back. 

## 2018-04-05 NOTE — Telephone Encounter (Signed)
-----   Message from Huston Foley, MD sent at 04/05/2018  8:22 AM EST ----- Patient referred by PCP, seen by me on 01/23/18, diagnostic PSG on 02/27/18. Patient had a CPAP titration study on 04/03/18.  Please call and inform patient that I have entered an order for treatment with positive airway pressure (PAP) treatment for obstructive sleep apnea (OSA). She did well during the latest sleep study with CPAP. We will, therefore, arrange for a machine for home use through a DME (durable medical equipment) company of Her choice; and I will see the patient back in follow-up in about 10 weeks. Please also explain to the patient that I will be looking out for compliance data, which can be downloaded from the machine (stored on an SD card, that is inserted in the machine) or via remote access through a modem, that is built into the machine. At the time of the followup appointment we will discuss sleep study results and how it is going with PAP treatment at home. Please advise patient to bring Her machine at the time of the first FU visit, even though this is cumbersome. Bringing the machine for every visit after that will likely not be needed, but often helps for the first visit to troubleshoot if needed. Please re-enforce the importance of compliance with treatment and the need for [place] to monitor compliance data - often an insurance requirement and actually good feedback for the patient as far as how they are doing.  Also remind patient, that any interim PAP machine or mask issues should be first addressed with the DME company, as they can often help better with technical and mask fit issues. Please ask if patient has a preference regarding DME company.  Please also make sure, the patient has a follow-up appointment with me in about 10 weeks from the setup date, thanks. May see one of our nurse practitioners if needed for proper timing of the FU appointment.  Please fax or rout report to the referring provider. Thanks,    Huston Foley, MD, PhD [facility])

## 2018-04-05 NOTE — Progress Notes (Signed)
Patient referred by PCP, seen by me on 01/23/18, diagnostic PSG on 02/27/18. Patient had a CPAP titration study on 04/03/18.  Please call and inform patient that I have entered an order for treatment with positive airway pressure (PAP) treatment for obstructive sleep apnea (OSA). She did well during the latest sleep study with CPAP. We will, therefore, arrange for a machine for home use through a DME (durable medical equipment) company of Her choice; and I will see the patient back in follow-up in about 10 weeks. Please also explain to the patient that I will be looking out for compliance data, which can be downloaded from the machine (stored on an SD card, that is inserted in the machine) or via remote access through a modem, that is built into the machine. At the time of the followup appointment we will discuss sleep study results and how it is going with PAP treatment at home. Please advise patient to bring Her machine at the time of the first FU visit, even though this is cumbersome. Bringing the machine for every visit after that will likely not be needed, but often helps for the first visit to troubleshoot if needed. Please re-enforce the importance of compliance with treatment and the need for [place] to monitor compliance data - often an insurance requirement and actually good feedback for the patient as far as how they are doing.  Also remind patient, that any interim PAP machine or mask issues should be first addressed with the DME company, as they can often help better with technical and mask fit issues. Please ask if patient has a preference regarding DME company.  Please also make sure, the patient has a follow-up appointment with me in about 10 weeks from the setup date, thanks. May see one of our nurse practitioners if needed for proper timing of the FU appointment.  Please fax or rout report to the referring provider. Thanks,   Huston Foley, MD, PhD [facility])

## 2018-04-06 NOTE — Telephone Encounter (Signed)
I took call from phone room staff. Pt was returning [place], [place] call regarding sleep study results. I was able to discuss results with patient and she decided on using [employer] for her DME. I advised I would send the order for CPAP so [employer] could start processing.  I instructed once the pt starts on her CPAP we would need to see her in 10 weeks (ok to see NP) and to try and use the CPAP for 4-5 hours each night for compliance. Pt was agreeable to this information. Pt was advised when she returns for f/u to bring her CPAP machine.   [employer] has been notified of new order via community message through epic.

## 2018-04-07 ENCOUNTER — Ambulatory Visit
Admission: RE | Admit: 2018-04-07 | Discharge: 2018-04-07 | Disposition: A | Discharge status: Home / Self Care | Payer: 59 | Source: Ambulatory Visit | Attending: Neurosurgery | Admitting: Neurosurgery

## 2018-04-07 DIAGNOSIS — M4807 Spinal stenosis, lumbosacral region: Secondary | ICD-10-CM | POA: Diagnosis not present

## 2018-04-07 DIAGNOSIS — M5416 Radiculopathy, lumbar region: Secondary | ICD-10-CM

## 2018-04-07 DIAGNOSIS — M5117 Intervertebral disc disorders with radiculopathy, lumbosacral region: Secondary | ICD-10-CM | POA: Diagnosis not present

## 2018-04-09 NOTE — Telephone Encounter (Signed)
I contacted the pt and left a vm requesting the pt to call back and schedule a 3 month follow up from 04/06/18 date. Pt can be scheduled with NP if necessary.

## 2018-04-09 NOTE — Telephone Encounter (Signed)
Note sent to nurse. 

## 2018-04-10 ENCOUNTER — Telehealth: Payer: Self-pay

## 2018-04-10 NOTE — Telephone Encounter (Signed)
I was able to speak with the pt and schedule f/u for 07/17/18 with MM, NP at 1 pm with a check in time of 12:30. Pt had no further questions at this time.

## 2018-04-11 DIAGNOSIS — E038 Other specified hypothyroidism: Secondary | ICD-10-CM | POA: Diagnosis not present

## 2018-04-11 DIAGNOSIS — Z6841 Body Mass Index (BMI) 40.0 and over, adult: Secondary | ICD-10-CM | POA: Diagnosis not present

## 2018-04-11 DIAGNOSIS — F331 Major depressive disorder, recurrent, moderate: Secondary | ICD-10-CM | POA: Diagnosis not present

## 2018-04-11 DIAGNOSIS — E1169 Type 2 diabetes mellitus with other specified complication: Secondary | ICD-10-CM | POA: Diagnosis not present

## 2018-04-11 DIAGNOSIS — Z1339 Encounter for screening examination for other mental health and behavioral disorders: Secondary | ICD-10-CM | POA: Diagnosis not present

## 2018-04-13 ENCOUNTER — Telehealth: Payer: Self-pay | Admitting: Neurology

## 2018-04-13 NOTE — Telephone Encounter (Signed)
Pt is asking for the name of the DME for her CPAP

## 2018-04-13 NOTE — Telephone Encounter (Signed)
Pt was called and given DME contact information, pt had no other questions.  No call back requested

## 2018-04-13 NOTE — Telephone Encounter (Signed)
DME: [facility] Phone: [phone], press option 1 Fax: [phone]

## 2018-04-23 MED FILL — LEVOTHYROXINE 50 MCG TABLET: 50 | 90 days supply | Qty: 90 | Fill #3

## 2018-04-23 MED FILL — FREESTYLE LITE TEST STRIP: 50 days supply | Qty: 100 | Fill #1

## 2018-04-24 DIAGNOSIS — G4733 Obstructive sleep apnea (adult) (pediatric): Secondary | ICD-10-CM | POA: Diagnosis not present

## 2018-05-03 DIAGNOSIS — M5416 Radiculopathy, lumbar region: Secondary | ICD-10-CM | POA: Diagnosis not present

## 2018-05-04 MED FILL — traMADol HCL 50 MG TABS: 50 | 15 days supply | Qty: 60 | Fill #0

## 2018-05-15 ENCOUNTER — Other Ambulatory Visit: Payer: Self-pay | Admitting: Adult Health

## 2018-05-15 MED FILL — ESCITALOPRAM 20 MG TABLET: 20 | 30 days supply | Qty: 30 | Fill #0

## 2018-05-24 MED FILL — GABAPENTIN 300 MG CAPSULE: 300 | 30 days supply | Qty: 30 | Fill #0

## 2018-05-25 DIAGNOSIS — G4733 Obstructive sleep apnea (adult) (pediatric): Secondary | ICD-10-CM | POA: Diagnosis not present

## 2018-06-12 DIAGNOSIS — E1169 Type 2 diabetes mellitus with other specified complication: Secondary | ICD-10-CM | POA: Diagnosis not present

## 2018-06-15 MED FILL — BLISOVI 24 FE 1-20 MG-MCG(2: 1-20 | 84 days supply | Qty: 84 | Fill #1

## 2018-06-15 MED FILL — ESCITALOPRAM 20 MG TABLET: 20 | 30 days supply | Qty: 30 | Fill #1 | Status: TO

## 2018-06-24 DIAGNOSIS — G4733 Obstructive sleep apnea (adult) (pediatric): Secondary | ICD-10-CM | POA: Diagnosis not present

## 2018-07-02 MED FILL — GABAPENTIN 300 MG CAPSULE: 300 | 30 days supply | Qty: 30 | Fill #1

## 2018-07-11 DIAGNOSIS — M545 Low back pain: Secondary | ICD-10-CM | POA: Diagnosis not present

## 2018-07-11 DIAGNOSIS — M5416 Radiculopathy, lumbar region: Secondary | ICD-10-CM | POA: Diagnosis not present

## 2018-07-11 DIAGNOSIS — M48062 Spinal stenosis, lumbar region with neurogenic claudication: Secondary | ICD-10-CM | POA: Diagnosis not present

## 2018-07-11 DIAGNOSIS — M5126 Other intervertebral disc displacement, lumbar region: Secondary | ICD-10-CM | POA: Diagnosis not present

## 2018-07-12 ENCOUNTER — Telehealth: Payer: Self-pay | Admitting: *Deleted

## 2018-07-12 NOTE — Telephone Encounter (Signed)
Due to current COVID 19 pandemic, our office is severely reducing in office visits until further notice, in order to minimize the risk to our patients and healthcare providers.  Pt understands that although there may be some limitations with this type of visit, we will take all precautions to reduce any security or privacy concerns.  Pt understands that this will be treated like an in office visit and we will file with pt's insurance, and there may be a patient responsible charge related to this service. Pt consented to [email or web address] visit. email sent to [email or web address].

## 2018-07-14 ENCOUNTER — Encounter: Payer: Self-pay | Admitting: Adult Health

## 2018-07-16 DIAGNOSIS — R7301 Impaired fasting glucose: Secondary | ICD-10-CM | POA: Diagnosis not present

## 2018-07-16 DIAGNOSIS — E039 Hypothyroidism, unspecified: Secondary | ICD-10-CM | POA: Diagnosis not present

## 2018-07-16 MED FILL — metFORMIN HCL ER 500 MG TB2: 500 | 30 days supply | Qty: 120 | Fill #0

## 2018-07-16 MED FILL — ESCITALOPRAM 20 MG TABLET: 20 | 30 days supply | Qty: 30 | Fill #0

## 2018-07-17 ENCOUNTER — Other Ambulatory Visit: Payer: Self-pay

## 2018-07-17 ENCOUNTER — Telehealth (INDEPENDENT_AMBULATORY_CARE_PROVIDER_SITE_OTHER): Payer: 59 | Admitting: Adult Health

## 2018-07-17 DIAGNOSIS — Z9989 Dependence on other enabling machines and devices: Secondary | ICD-10-CM | POA: Diagnosis not present

## 2018-07-17 DIAGNOSIS — G4733 Obstructive sleep apnea (adult) (pediatric): Secondary | ICD-10-CM

## 2018-07-17 MED FILL — LEVOTHYROXINE 75 MCG TABLET: 75 | 30 days supply | Qty: 30 | Fill #0

## 2018-07-17 NOTE — Progress Notes (Signed)
PATIENT: Karen Hoover DOB: 03-28-1990  REASON FOR VISIT: follow up HISTORY FROM: patient  Virtual Visit via Video Note  I connected with Karen Hoover on 07/17/18 at  1:00 PM EDT by a video enabled telemedicine application located remotely at [facility] and verified that I am speaking with the correct person using two identifiers who was located at their own home.   I discussed the limitations of evaluation and management by telemedicine and the availability of in person appointments. The patient expressed understanding and agreed to proceed.   PATIENT: Karen Hoover DOB: 10-07-1990  REASON FOR VISIT: follow up HISTORY FROM: patient  HISTORY OF PRESENT ILLNESS: Today 07/17/18:  Karen Hoover is a 28 year old female with a history of obstructive sleep apnea on CPAP.  She returns today for virtual visit.  Her CPAP download indicates that she use her machine 26 out of 30 days for compliance of 86.7%.  She used her machine greater than 4 hours for compliance of 43.3%.  On average she uses her machine 3 hours and 48 minutes.  Her residual AHI is 3.9 on 9 cm of water.  She states that night she tends to take off the mask while she is sleeping unknowingly.  She is currently using the nasal pillows.  She does like the mask.  She does not feel it leaking.  She joins me today for virtual visit.  HISTORY (copied from Dr. Guadelupe Sabin note) Karen Hoover is a 28 year old right-handed woman with an underlying medical history of interstitial cystitis, iron deficiency anemia, chronic back pain, hypothyroidism, and morbid obesity with a BMI of over 40, who reports snoring and excessive daytime somnolence. I reviewed your office note from 11/29/2017, which you kindly included. Her mom has witnessed the occasional breathing pulse while she is asleep. She has no known family history of OSA. 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 She tries to make  enough time for sleep, Epworth sleepiness score is high at 19 out of 24 today, fatigue scores 42 out of 63. Typical bedtime is between 6 and 7 PM as she has an early rise time of 4 AM. She does have a 40 minute comminuted admits that she gets sleepy while driving sometimes and has even just off at the wheel. She is aware of the risk of dozing off at the wheel. She has had trouble losing weight. She was seeing a nutritionist for about 3 months and was able to lose about 10 pounds during that time but gained some of it back. She is a nonsmoker and takes alcohol occasionally, drinks caffeine in the form of soda, 2-3 per day on average, also uses caffeine to help stay awake. She does not watch TV in her bedroom, they have 1 cat in the household, she does not have night to night nocturia, denies recurrent morning headaches. She does not have preferred sleep position, she does toss and turn at night. Her sleepiness has become worse over the past several months, likely over this past year. She has not been a sleepy child per se.  REVIEW OF SYSTEMS: Out of a complete 14 system review of symptoms, the patient complains only of the following symptoms, and all other reviewed systems are negative.  See HPI  ALLERGIES: No Known Allergies  HOME MEDICATIONS: Outpatient Medications Prior to Visit  Medication Sig Dispense Refill  . acetaminophen (TYLENOL) 500 MG tablet Take 1,000 mg by mouth every 6 (six)  hours as needed.    Marland Kitchen aspirin EC 81 MG tablet Take 81 mg by mouth daily.    Marland Kitchen BLISOVI 24 FE 1-20 MG-MCG(24) tablet TAKE 1 TABLET BY MOUTH ONCE DAILY 28 tablet 11  . escitalopram (LEXAPRO) 20 MG tablet TAKE 1 TABLET BY MOUTH ONCE DAILY 30 tablet 12  . ferrous sulfate 325 (65 FE) MG tablet Take 325 mg by mouth daily.    . fluticasone (FLONASE) 50 MCG/ACT nasal spray Place 2 sprays into both nostrils as needed.   0  . HYDROcodone-acetaminophen (NORCO/VICODIN) 5-325 MG tablet Take 1 tablet by mouth every 6 (six) hours  as needed for moderate pain.    Marland Kitchen ibuprofen (ADVIL,MOTRIN) 400 MG tablet Take 400 mg by mouth daily.   2  . levothyroxine (SYNTHROID, LEVOTHROID) 50 MCG tablet Take 50 mcg by mouth daily.     . metFORMIN (GLUCOPHAGE) 500 MG tablet Take 1,000 mg by mouth 2 (two) times daily with a meal.    . methocarbamol (ROBAXIN) 500 MG tablet Take 1 tablet (500 mg total) by mouth 3 (three) times daily between meals as needed. 20 tablet 0  . traMADol (ULTRAM) 50 MG tablet Take 1 tablet (50 mg total) by mouth every 6 (six) hours as needed. 6 tablet 0   No facility-administered medications prior to visit.     PAST MEDICAL HISTORY: Past Medical History:  Diagnosis Date  . Anemia   . Anxiety 03/17/2015  . BV (bacterial vaginosis) 04/10/2014  . Chronic lower back pain    2 herniated disc sees Dr Carloyn Manner  . Encounter for menstrual regulation 03/13/2014  . Family history of breast cancer   . Family history of melanoma   . Hemorrhoids 01/12/2015  . History of rectal bleeding 01/12/2015  . Hypothyroidism   . IC (interstitial cystitis) 01/22/2015  . Pulmonary edema 08/21/2015   S/P back OR  . Thyroid disease   . Vaginal discharge 03/13/2014  . Vaginal itching 01/12/2015  . Vestibulitis, vulvar 04/21/2014  . Yeast infection 03/13/2014    PAST SURGICAL HISTORY: Past Surgical History:  Procedure Laterality Date  . BACK SURGERY    . LAMINECTOMY AND MICRODISCECTOMY LUMBAR SPINE Right 08/21/2015   L4-5 and L5-S 1    FAMILY HISTORY: Family History  Problem Relation Age of Onset  . Diabetes Mother   . Thyroid disease Mother   . Breast cancer Mother 47       CHEK2 pos  . Thyroid disease Brother   . Diabetes Maternal Grandmother   . Melanoma Maternal Grandmother        d. 73  . Melanoma Maternal Grandfather        d. 50  . Kidney cancer Other        MGF's sister  . Breast cancer Cousin 24       mother's pat first cousin    SOCIAL HISTORY: Social History   Socioeconomic History  . Marital status: Single     Spouse name: Not on file  . Number of children: Not on file  . Years of education: Not on file  . Highest education level: Not on file  Occupational History  . Occupation: Surgical Occupational hygienist: [employer]  Social Needs  . Financial resource strain: Not on file  . Food insecurity:    Worry: Not on file    Inability: Not on file  . Transportation needs:    Medical: Not on file    Non-medical: Not  on file  Tobacco Use  . Smoking status: Never Smoker  . Smokeless tobacco: Never Used  Substance and Sexual Activity  . Alcohol use: No    Comment: occasionally   . Drug use: No  . Sexual activity: Never    Birth control/protection: Pill  Lifestyle  . Physical activity:    Days per week: Not on file    Minutes per session: Not on file  . Stress: Not on file  Relationships  . Social connections:    Talks on phone: Not on file    Gets together: Not on file    Attends religious service: Not on file    Active member of club or organization: Not on file    Attends meetings of clubs or organizations: Not on file    Relationship status: Not on file  . Intimate partner violence:    Fear of current or ex partner: Not on file    Emotionally abused: Not on file    Physically abused: Not on file    Forced sexual activity: Not on file  Other Topics Concern  . Not on file  Social History Narrative  . Not on file      PHYSICAL EXAM Generalized: Well developed, in no acute distress   Neurological examination  Mentation: Alert oriented to time, place, history taking. Follows all commands speech and language fluent Cranial nerve II-XII:Extraocular movements were full. Facial symmetry noted. uvula tongue midline. Head turning and shoulder shrug  were normal and symmetric. Motor: Good strength throughout subjectively per patient Sensory: Sensory testing is intact to soft touch on all 4 extremities subjectively per patient Coordination: Cerebellar testing  reveals good finger-nose-finger  Gait and station: Patient is able to stand from a seated position. gait is normal.  Reflexes: UTA  DIAGNOSTIC DATA (LABS, IMAGING, TESTING) - I reviewed patient records, labs, notes, testing and imaging myself where available.  Lab Results  Component Value Date   WBC 15.3 (H) 04/24/2016   HGB 11.7 (L) 04/24/2016   HCT 34.6 (L) 04/24/2016   MCV 78.6 04/24/2016   PLT 366 04/24/2016      Component Value Date/Time   NA 138 11/06/2015 1952   NA 139 04/10/2014 1021   K 3.4 (L) 11/06/2015 1952   CL 105 11/06/2015 1952   CO2 23 11/06/2015 1952   GLUCOSE 165 (H) 11/06/2015 1952   BUN 6 11/06/2015 1952   BUN 9 04/10/2014 1021   CREATININE 0.61 11/06/2015 1952   CALCIUM 9.4 11/06/2015 1952   PROT 6.5 04/10/2014 1021   ALBUMIN 4.1 04/10/2014 1021   AST 14 04/10/2014 1021   ALT 16 04/10/2014 1021   ALKPHOS 65 04/10/2014 1021   BILITOT 0.5 04/10/2014 1021   GFRNONAA >60 11/06/2015 1952   GFRAA >60 11/06/2015 1952   No results found for: CHOL, HDL, LDLCALC, LDLDIRECT, TRIG, CHOLHDL Lab Results  Component Value Date   HGBA1C 5.6 04/10/2014   No results found for: VITAMINB12 No results found for: TSH    ASSESSMENT AND PLAN 28 y.o. year old female  has a past medical history of Anemia, Anxiety (03/17/2015), BV (bacterial vaginosis) (04/10/2014), Chronic lower back pain, Encounter for menstrual regulation (03/13/2014), Family history of breast cancer, Family history of melanoma, Hemorrhoids (01/12/2015), History of rectal bleeding (01/12/2015), Hypothyroidism, IC (interstitial cystitis) (01/22/2015), Pulmonary edema (08/21/2015), Thyroid disease, Vaginal discharge (03/13/2014), Vaginal itching (01/12/2015), Vestibulitis, vulvar (04/21/2014), and Yeast infection (03/13/2014). here with:  1.  Obstructive sleep apnea on CPAP  The patient  CPAP download shows suboptimal compliance but good treatment of her apnea.  She is encouraged to try to use machine greater than 4  hours each night.  I have encouraged the patient to tighten her straps to her mask and perhaps try to wear it during the day to get used to using the CPAP.  Patient voiced understanding.   I spent 15 minutes with the patient this time was spent reviewing her CPAP download.   Ward Givens, MSN, NP-C 07/17/2018, 1:12 PM [facility] [address] [place], [place] [address] [phone]

## 2018-07-25 ENCOUNTER — Encounter (HOSPITAL_COMMUNITY): Payer: Self-pay

## 2018-07-25 ENCOUNTER — Other Ambulatory Visit: Payer: Self-pay

## 2018-07-25 ENCOUNTER — Ambulatory Visit (HOSPITAL_COMMUNITY): Payer: 59 | Attending: Neurosurgery

## 2018-07-25 DIAGNOSIS — R293 Abnormal posture: Secondary | ICD-10-CM | POA: Diagnosis not present

## 2018-07-25 DIAGNOSIS — G8929 Other chronic pain: Secondary | ICD-10-CM | POA: Diagnosis not present

## 2018-07-25 DIAGNOSIS — G4733 Obstructive sleep apnea (adult) (pediatric): Secondary | ICD-10-CM | POA: Diagnosis not present

## 2018-07-25 DIAGNOSIS — R262 Difficulty in walking, not elsewhere classified: Secondary | ICD-10-CM | POA: Diagnosis not present

## 2018-07-25 DIAGNOSIS — M545 Low back pain: Secondary | ICD-10-CM | POA: Insufficient documentation

## 2018-07-25 NOTE — Therapy (Signed)
[facility], [place], [address] Phone: [phone]   Fax:  [phone]  Physical Therapy Evaluation  Patient Details  Name: Karen Hoover MRN: [id number] Date of Birth: Sep 28, 1990 Referring Provider (PT): Erline Levine, MD   Encounter Date: 07/25/2018  PT End of Session - 07/25/18 0926    Visit Number  1    Number of Visits  6    Date for PT Re-Evaluation  08/15/18    Authorization Type  [facility]    Authorization Time Period  07/25/18 to 08/15/18    Authorization - Visit Number  1    Authorization - Number of Visits  25   clinical eligibility required after 25th visit   PT Start Time  0828    PT Stop Time  0918    PT Time Calculation (min)  50 min    Activity Tolerance  Patient tolerated treatment well    Behavior During Therapy  [facility] for tasks assessed/performed       Past Medical History:  Diagnosis Date  . Anemia   . Anxiety 03/17/2015  . BV (bacterial vaginosis) 04/10/2014  . Chronic lower back pain    2 herniated disc sees Dr Carloyn Manner  . Encounter for menstrual regulation 03/13/2014  . Family history of breast cancer   . Family history of melanoma   . Hemorrhoids 01/12/2015  . History of rectal bleeding 01/12/2015  . Hypothyroidism   . IC (interstitial cystitis) 01/22/2015  . Pulmonary edema 08/21/2015   S/P back OR  . Thyroid disease   . Vaginal discharge 03/13/2014  . Vaginal itching 01/12/2015  . Vestibulitis, vulvar 04/21/2014  . Yeast infection 03/13/2014    Past Surgical History:  Procedure Laterality Date  . BACK SURGERY    . LAMINECTOMY AND MICRODISCECTOMY LUMBAR SPINE Right 08/21/2015   L4-5 and L5-S 1    There were no vitals filed for this visit.   Subjective Assessment - 07/25/18 0831    Subjective  Pt reports having chronic LBP since 2014 of insidious onset and Dr. Carloyn Manner told her that she had a couple of herniated discs. She has tried cortisone shots which helped some but then she started  having urine incontinence so she had disectomy surgery in 2017 by Dr. Vertell Limber. This helped with her pain for 1+years and then the pain and urine incontinence returned; she was referred to a bladder specialist who told her she has a smaller bladder than usual but isn't sure if the urine incontinence is coming from her back. She is still seeing a Chiropractor 3x/week currently and Dr. Vertell Limber referred her to PT to strengthen her back and legs. She has had internal pelvic health in the past with + response but states that it was too expensive so she quit going. She has difficulty walking long distances. She reports pain down BLE, R>L, which occurs mostly when she is doing a lot of strenuous activities/heavy lifting. She reports the pain as sharp and n/t which goes down all sides of her leg down to her foot.    Limitations  Walking;Lifting;Standing    How long can you sit comfortably?  no real issues    How long can you stand comfortably?  5 mins    How long can you walk comfortably?  1 mile    Diagnostic tests  MRI (see imaging tab)    Patient Stated Goals  get mm stronger and get back to being able to exercise with  less pain    Currently in Pain?  Yes    Pain Score  3     Pain Location  Back    Pain Orientation  Right    Pain Descriptors / Indicators  Aching;Dull    Pain Type  Chronic pain    Pain Onset  More than a month ago    Pain Frequency  Intermittent    Aggravating Factors   walking, standing, heavy lifting/strenuous activities    Pain Relieving Factors  ice, hot shower, laying on L side    Effect of Pain on Daily Activities  increase         [facility] PT Assessment - 07/25/18 0001      Assessment   Medical Diagnosis  Lumbar radiculopathy    Referring Provider (PT)  Erline Levine, MD    Onset Date/Surgical Date  --   2014   Next MD Visit  August 2020    Prior Therapy  no PT, chiropractic work for present issue      Balance Screen   Has the patient fallen in the past 6 months  Yes    How  many times?  2   slipped in rain; tripped going up stairs   Has the patient had a decrease in activity level because of a fear of falling?   No    Is the patient reluctant to leave their home because of a fear of falling?   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    Leisure  go swimming, play with pets      Cognition   Overall Cognitive Status  Within Functional Limits for tasks assessed      Observation/Other Assessments   Observations  clonus negative bil    Focus on Therapeutic Outcomes (FOTO)   to be completed next visit    Other Surveys   Oswestry Disability Index      Sensation   Light Touch  Appears Intact      Posture/Postural Control   Posture/Postural Control  Postural limitations    Postural Limitations  Rounded Shoulders;Decreased lumbar lordosis;Increased thoracic kyphosis      Deep Tendon Reflexes   DTR Assessment Site  Patella;Achilles    Patella DTR  2+   bil   Achilles DTR  2+   bil     ROM / Strength   AROM / PROM / Strength  AROM;Strength      AROM   Overall AROM Comments  L hip IR tighter than R; ER WFL BLE    AROM Assessment Site  Lumbar    Lumbar Flexion  50% limited; lumbar lordosis limited; RFISx10 no change    Lumbar Extension  WFL; REISx20 no change    Lumbar - Right Side Bend  just above knee   R hip tightness   Lumbar - Left Side Bend  just above knee   L hip tightness   Lumbar - Right Rotation  25% limited    Lumbar - Left Rotation  25% limited      Strength   Strength Assessment Site  Hip;Knee;Ankle    Right/Left Hip  Right;Left    Right Hip Flexion  4+/5    Right Hip Extension  4-/5    Right Hip ABduction  4/5    Left Hip Flexion  4+/5    Left  Hip Extension  4/5    Left Hip ABduction  4/5    Right Knee Flexion  4+/5    Right Knee Extension  5/5    Left Knee Flexion  4+/5    Left Knee Extension  5/5    Right Ankle  Dorsiflexion  5/5    Left Ankle Dorsiflexion  5/5      Flexibility   Soft Tissue Assessment /Muscle Length  yes    Hamstrings  90/90: WNL BLE    Quadriceps  Ely's negative    Piriformis  L tighter than R      Palpation   Spinal mobility  spinal hypomobility T12-S1 recreated same pain with CPAs    Palpation comment  max restrictions and tenderness and recreation of same pain with palpation to bil lumbar paraspinals and glutes, R>L, palpation to R glutes recreated RLE pain which centralized with prolonged MFR      Special Tests    Special Tests  Lumbar    Lumbar Tests  Slump Test;Straight Leg Raise      Slump test   Findings  Negative    Side  --   bil     Straight Leg Raise   Findings  Negative    Side   --   bil     Ambulation/Gait   Ambulation/Gait  Yes    Ambulation Distance (Feet)  628 Feet   3MWT   Assistive device  None    Gait Pattern  Step-through pattern;Trendelenburg   bil foot ER, L>R     Balance   Balance Assessed  Yes      Static Standing Balance   Static Standing - Balance Support  No upper extremity supported    Static Standing Balance -  Activities   Single Leg Stance - Right Leg;Single Leg Stance - Left Leg    Static Standing - Comment/# of Minutes  R: 5.6 sec, L: 21 sec            Objective measurements completed on examination: See above findings.         PT Education - 07/25/18 0926    Education Details  exam findings, HEP, POC    Person(s) Educated  Patient    Methods  Explanation;Demonstration;Handout    Comprehension  Verbalized understanding;Returned demonstration       PT Short Term Goals - 07/25/18 0954      PT SHORT TERM GOAL #1   Title  Pt will be able to perform R SLS for 20sec without R trunk lean in order to demo improved functional hip and core strength to maximize gait.    Time  2    Period  Weeks    Status  New    Target Date  08/08/18      PT SHORT TERM GOAL #2   Title  Pt will report being able to stand for  10 mins or > with 2/10 LBP and R hip pain in order to demo improved tolerance to functional tasks and maximize function at work.    Time  2    Period  Weeks    Status  New      PT SHORT TERM GOAL #3   Title  Pt will have improved ODI by 10% or > to demo reduced self-perceived disability due to LBP.    Time  2    Period  Weeks    Status  New        PT Long Term  Goals - 07/25/18 0954      PT LONG TERM GOAL #1   Title  Pt will have improved MMT by 1/2 grade in order to reduce LBP and maximize overall function.    Time  3    Period  Weeks    Status  New    Target Date  08/15/18      PT LONG TERM GOAL #2   Title  Pt will have reduced soft tissue restrictions throughout lumbar paraspinals and glutes to moderate in order to improve lumbar ROM and reduce overall pain.    Time  3    Period  Weeks    Status  New      PT LONG TERM GOAL #3   Title  Pt will be able to participate in exercise routine for 15 mins with 2/10 LBP in order to return to PLOF.    Time  3    Period  Weeks    Status  New      PT LONG TERM GOAL #4   Title  Pt will report being able to walk 2 miles with 2/10 LBP and R hip pain or better in order to demo improved tolerance to functional mobility and maximize function at work.    Time  3    Period  Weeks    Status  New             Plan - 07/25/18 0946    Clinical Impression Statement  Pt is pleasant 28YO F who presents to OPPT with c/o chronic LBP with BLE intermittent referred pain, R>L. She has urine incontinence but Dr. Vertell Limber is aware of this. She currently presents with deficits in lumbar and thoracic ROM, posture, hip and core strength, as well as deficits in hip flexibility, thoracic and lumbar spinal mobility, with increased soft tissue restrictions throughout lumbar paraspinals and gluteals. Palpation to lumbar paraspinals (R>L) and CPAs from T12-sacrum recreated her same pain in the lower back and palpation to the glutes (R>L) recreated her same  referred pain down RLE; this leg pain centralized with prolonged MFR. Pt's myotomes, dermatomes, SLR test, and slump test all WNL, indicating low risk for neural involvement. Pt presenting with s/s consistent with MSK impairments and needs skilled PT intervention to address her deficits in order to reduce her pain and improve overall function. Feel her urine incontinence is from pelvic floor mm and will attempt to treat with external mm strengthening, however, if unsuccessful, will refer her out to a pelvic health specialist for internal treatment.    Personal Factors and Comorbidities  Past/Current Experience;Time since onset of injury/illness/exacerbation;Comorbidity 3+    Comorbidities  see above    Examination-Activity Limitations  Lift;Locomotion Level    Examination-Participation Restrictions  Other   work   Stability/Clinical Decision Making  Stable/Uncomplicated    Clinical Decision Making  Low    Rehab Potential  Good    PT Frequency  2x / week    PT Duration  3 weeks    PT Treatment/Interventions  ADLs/Self Care Home Management;Cryotherapy;Electrical Stimulation;Moist Heat;Ultrasound;Gait training;Stair training;Functional mobility training;Therapeutic activities;Therapeutic exercise;Balance training;Neuromuscular re-education;Patient/family education;Orthotic Fit/Training;Manual techniques;Scar mobilization;Passive range of motion;Dry needling;Taping;Spinal Manipulations;Joint Manipulations    PT Next Visit Plan  review goals, HEP; administer FOTO; assess SIJ cluster testing; initiate core strength (pelvic floor activation, TA engagement, etc.), balance, piriformis stretching, thoracic and hip mobility, STM for soft tissue restrictions and joint mobs for pain and mobility    PT Home Exercise Plan  eval:  LTRs, seated figure 4 (pulling knee towards chest)    Recommended Other Services  potentially pelvic health PT    Consulted and Agree with Plan of Care  Patient       Patient will  benefit from skilled therapeutic intervention in order to improve the following deficits and impairments:  Decreased activity tolerance, Decreased endurance, Decreased balance, Decreased range of motion, Decreased strength, Difficulty walking, Hypomobility, Increased fascial restricitons, Increased muscle spasms, Impaired flexibility, Improper body mechanics, Postural dysfunction, Obesity, Pain  Visit Diagnosis: 1. Chronic bilateral low back pain without sciatica   2. Abnormal posture   3. Difficulty in walking, not elsewhere classified        Problem List Patient Active Problem List   Diagnosis Date Noted  . 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 Family history of breast cancer   . Family history of melanoma   . Urge incontinence 03/23/2016  . Diarrhea 12/16/2015  . Change in bowel habits 12/16/2015  . Generalized abdominal pain 12/16/2015  . Rectal bleeding 12/16/2015  . Pulmonary edema 08/21/2015  . Anxiety 03/17/2015  . IC (interstitial cystitis) 01/22/2015  . Vaginal itching 01/12/2015  . Hemorrhoids 01/12/2015  . History of rectal bleeding 01/12/2015  . Vestibulitis, vulvar 04/21/2014  . BV (bacterial vaginosis) 04/10/2014  . Vaginal discharge 03/13/2014  . Yeast infection 03/13/2014  . Encounter for menstrual regulation 03/13/2014     Geraldine Solar PT, DPT   [facility] [address] [place], [place], [address] Phone: [phone]   Fax:  [phone]  Name: Karen Hoover MRN: [id number] Date of Birth: [birth year]

## 2018-08-07 ENCOUNTER — Other Ambulatory Visit: Payer: Self-pay

## 2018-08-07 ENCOUNTER — Ambulatory Visit (HOSPITAL_COMMUNITY): Payer: 59 | Admitting: Physical Therapy

## 2018-08-07 DIAGNOSIS — G8929 Other chronic pain: Secondary | ICD-10-CM | POA: Diagnosis not present

## 2018-08-07 DIAGNOSIS — R293 Abnormal posture: Secondary | ICD-10-CM | POA: Diagnosis not present

## 2018-08-07 DIAGNOSIS — E1169 Type 2 diabetes mellitus with other specified complication: Secondary | ICD-10-CM | POA: Diagnosis not present

## 2018-08-07 DIAGNOSIS — R262 Difficulty in walking, not elsewhere classified: Secondary | ICD-10-CM

## 2018-08-07 DIAGNOSIS — Z0000 Encounter for general adult medical examination without abnormal findings: Secondary | ICD-10-CM | POA: Diagnosis not present

## 2018-08-07 DIAGNOSIS — E038 Other specified hypothyroidism: Secondary | ICD-10-CM | POA: Diagnosis not present

## 2018-08-07 DIAGNOSIS — M545 Low back pain: Secondary | ICD-10-CM | POA: Diagnosis not present

## 2018-08-07 NOTE — Therapy (Signed)
[facility] [address] [place], [place], [address] Phone: [phone]   Fax:  [phone]  Physical Therapy Treatment  Patient Details  Name: Karen Hoover MRN: [id number] Date of Birth: [birth year] Referring Provider (PT): Erline Levine, MD   Encounter Date: 08/07/2018    Past Medical History:  Diagnosis Date  . Anemia   . Anxiety 03/17/2015  . BV (bacterial vaginosis) 04/10/2014  . Chronic lower back pain    2 herniated disc sees Dr Carloyn Manner  . Encounter for menstrual regulation 03/13/2014  . Family history of breast cancer   . Family history of melanoma   . Hemorrhoids 01/12/2015  . History of rectal bleeding 01/12/2015  . Hypothyroidism   . IC (interstitial cystitis) 01/22/2015  . Pulmonary edema 08/21/2015   S/P back OR  . Thyroid disease   . Vaginal discharge 03/13/2014  . Vaginal itching 01/12/2015  . Vestibulitis, vulvar 04/21/2014  . Yeast infection 03/13/2014    Past Surgical History:  Procedure Laterality Date  . BACK SURGERY    . LAMINECTOMY AND MICRODISCECTOMY LUMBAR SPINE Right 08/21/2015   L4-5 and L5-S 1    There were no vitals filed for this visit.  Subjective Assessment - 08/07/18 1239    Subjective  Pt states she had to work last night so she is tired.  States she is currently not having any issues or symptoms.  Completing her HEP.                       [facility] PT Treatment/Exercise - 08/07/18 0001      Knee/Hip Exercises: Stretches   Active Hamstring Stretch  Both;3 reps;20 seconds    Active Hamstring Stretch Limitations  supine with towel    Piriformis Stretch  Both;3 reps;20 seconds    Other Knee/Hip Stretches  lower trunk rotations 10X10" each      Knee/Hip Exercises: Seated   Other Seated Knee/Hip Exercises  figure 4 stretch (review for HEP using towel)      Knee/Hip Exercises: Supine   Bridges  Both;10 reps    Bridges Limitations  only going to comffort with iso contraction of glutes,  iso contraction of abs    Bridges with Clamshell  Both;10 reps;Limitations   clams only without bridge   Other Supine Knee/Hip Exercises  kegals, 10X10" holds, quick twitch 10X2 sets given for HEP    Other Supine Knee/Hip Exercises  abdominal bracing 10X5" holds             PT Education - 08/07/18 0902    Education Details  reviewed goals and HEP.  educated on importance of abdominal bracing with all exercises, functional movements.  Given kegals for HEP.    Person(s) Educated  Patient    Methods  Explanation;Demonstration;Tactile cues;Verbal cues    Comprehension  Verbalized understanding;Returned demonstration;Verbal cues required;Tactile cues required       PT Short Term Goals - 08/07/18 0840      PT SHORT TERM GOAL #1   Title  Pt will be able to perform R SLS for 20sec without R trunk lean in order to demo improved functional hip and core strength to maximize gait.    Time  2    Period  Weeks    Status  On-going    Target Date  08/08/18      PT SHORT TERM GOAL #2   Title  Pt will report being able to stand for 10 mins or >  with 2/10 LBP and R hip pain in order to demo improved tolerance to functional tasks and maximize function at work.    Time  2    Period  Weeks    Status  On-going      PT SHORT TERM GOAL #3   Title  Pt will have improved ODI by 10% or > to demo reduced self-perceived disability due to LBP.    Time  2    Period  Weeks    Status  On-going        PT Long Term Goals - 08/07/18 0841      PT LONG TERM GOAL #1   Title  Pt will have improved MMT by 1/2 grade in order to reduce LBP and maximize overall function.    Time  3    Period  Weeks    Status  On-going      PT LONG TERM GOAL #2   Title  Pt will have reduced soft tissue restrictions throughout lumbar paraspinals and glutes to moderate in order to improve lumbar ROM and reduce overall pain.    Time  3    Period  Weeks    Status  On-going      PT LONG TERM GOAL #3   Title  Pt will be  able to participate in exercise routine for 15 mins with 2/10 LBP in order to return to PLOF.    Time  3    Period  Weeks    Status  New      PT LONG TERM GOAL #4   Title  Pt will report being able to walk 2 miles with 2/10 LBP and R hip pain or better in order to demo improved tolerance to functional mobility and maximize function at work.    Time  3    Period  Weeks    Status  On-going            Plan - 08/07/18 1121    Clinical Impression Statement  Reveiwed HEP and goals per POC.  Reveiwed HEP with pateint being able to to complete, however diffiuclty with seated figure 4 stretch.  Had pateint use a towel with improved results.  pt also instructed with seated piriformis stretch and additional core and pelvic floor stabilization exercises.  Pt able to complete all exercises without difficulty other than bridging, however able to diminish pain when instructed to stabilize core and pelvis while completing, decrease ROM and isometrically contract glutes.  Foto also completed this session wtih FS of 41% (impaired 59%).  Instructed with kegals this session and to complete along with HEP.    Personal Factors and Comorbidities  Past/Current Experience;Time since onset of injury/illness/exacerbation;Comorbidity 3+    Comorbidities  see above    Examination-Activity Limitations  Lift;Locomotion Level    Examination-Participation Restrictions  Other   work   Stability/Clinical Decision Making  Stable/Uncomplicated    Rehab Potential  Good    PT Frequency  2x / week    PT Duration  3 weeks    PT Treatment/Interventions  ADLs/Self Care Home Management;Cryotherapy;Electrical Stimulation;Moist Heat;Ultrasound;Gait training;Stair training;Functional mobility training;Therapeutic activities;Therapeutic exercise;Balance training;Neuromuscular re-education;Patient/family education;Orthotic Fit/Training;Manual techniques;Scar mobilization;Passive range of motion;Dry needling;Taping;Spinal  Manipulations;Joint Manipulations    PT Next Visit Plan  Assess SIJ cluster testing when with PT next.  Continue to progress core strength (pelvic floor activation, TA engagement, etc.).  Progress to balance.  Begin thoracic and hip mobility exercises.  Complete STM for soft tissue restrictions and  joint mobs for pain and mobility PRN.  Follow up on kegals and additional exercises completing at home.    PT Home Exercise Plan  eval: LTRs, seated figure 4 (pulling knee towards chest) 6/30: kegals long holds and fast twitch    Consulted and Agree with Plan of Care  Patient       Patient will benefit from skilled therapeutic intervention in order to improve the following deficits and impairments:  Decreased activity tolerance, Decreased endurance, Decreased balance, Decreased range of motion, Decreased strength, Difficulty walking, Hypomobility, Increased fascial restricitons, Increased muscle spasms, Impaired flexibility, Improper body mechanics, Postural dysfunction, Obesity, Pain  Visit Diagnosis: 1. Chronic bilateral low back pain without sciatica   2. Difficulty in walking, not elsewhere classified   3. Abnormal posture        Problem List Patient Active Problem List   Diagnosis Date Noted  . 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 Family history of breast cancer   . Family history of melanoma   . Urge incontinence 03/23/2016  . Diarrhea 12/16/2015  . Change in bowel habits 12/16/2015  . Generalized abdominal pain 12/16/2015  . Rectal bleeding 12/16/2015  . Pulmonary edema 08/21/2015  . Anxiety 03/17/2015  . IC (interstitial cystitis) 01/22/2015  . Vaginal itching 01/12/2015  . Hemorrhoids 01/12/2015  . History of rectal bleeding 01/12/2015  . Vestibulitis, vulvar 04/21/2014  . BV (bacterial vaginosis) 04/10/2014  . Vaginal discharge 03/13/2014  . Yeast infection 03/13/2014  . Encounter for menstrual regulation 03/13/2014   Teena Irani,  PTA/CLT [phone]  Teena Irani 08/07/2018, 12:44 PM  [facility] [address] [place], [place], [address] Phone: [phone]   Fax:  [phone]  Name: Karen Hoover MRN: [id number] Date of Birth: [birth year]

## 2018-08-09 ENCOUNTER — Telehealth (HOSPITAL_COMMUNITY): Payer: Self-pay | Admitting: Internal Medicine

## 2018-08-09 ENCOUNTER — Ambulatory Visit (HOSPITAL_COMMUNITY): Payer: 59

## 2018-08-09 DIAGNOSIS — R0683 Snoring: Secondary | ICD-10-CM | POA: Diagnosis not present

## 2018-08-09 DIAGNOSIS — M5416 Radiculopathy, lumbar region: Secondary | ICD-10-CM | POA: Diagnosis not present

## 2018-08-09 DIAGNOSIS — E1169 Type 2 diabetes mellitus with other specified complication: Secondary | ICD-10-CM | POA: Diagnosis not present

## 2018-08-09 DIAGNOSIS — D509 Iron deficiency anemia, unspecified: Secondary | ICD-10-CM | POA: Diagnosis not present

## 2018-08-09 DIAGNOSIS — F331 Major depressive disorder, recurrent, moderate: Secondary | ICD-10-CM | POA: Diagnosis not present

## 2018-08-09 DIAGNOSIS — Z1331 Encounter for screening for depression: Secondary | ICD-10-CM | POA: Diagnosis not present

## 2018-08-09 DIAGNOSIS — Z0000 Encounter for general adult medical examination without abnormal findings: Secondary | ICD-10-CM | POA: Diagnosis not present

## 2018-08-09 DIAGNOSIS — E039 Hypothyroidism, unspecified: Secondary | ICD-10-CM | POA: Diagnosis not present

## 2018-08-09 NOTE — Telephone Encounter (Signed)
08/09/18  cx - pt has to work tonight and didn't think about that when she made these appts.

## 2018-08-13 ENCOUNTER — Ambulatory Visit (HOSPITAL_COMMUNITY): Payer: 59 | Attending: Neurosurgery

## 2018-08-13 ENCOUNTER — Encounter (HOSPITAL_COMMUNITY): Payer: Self-pay

## 2018-08-13 ENCOUNTER — Other Ambulatory Visit: Payer: Self-pay

## 2018-08-13 DIAGNOSIS — R293 Abnormal posture: Secondary | ICD-10-CM | POA: Insufficient documentation

## 2018-08-13 DIAGNOSIS — M545 Low back pain, unspecified: Secondary | ICD-10-CM

## 2018-08-13 DIAGNOSIS — R262 Difficulty in walking, not elsewhere classified: Secondary | ICD-10-CM | POA: Diagnosis not present

## 2018-08-13 DIAGNOSIS — G8929 Other chronic pain: Secondary | ICD-10-CM | POA: Diagnosis not present

## 2018-08-13 NOTE — Therapy (Signed)
[place], [place], [address] Phone: [phone]   Fax:  [phone]  Physical Therapy Treatment  Patient Details  Name: Karen Hoover MRN: [id number] Date of Birth: 1991/01/28 Referring Provider (PT): Erline Levine, MD   Encounter Date: 08/13/2018  PT End of Session - 08/13/18 0914    Visit Number  3   corrected visit count this date   Number of Visits  6    Date for PT Re-Evaluation  08/15/18    Authorization Type  [facility]    Authorization Time Period  07/25/18 to 08/15/18    Authorization - Visit Number  3    Authorization - Number of Visits  25   clinical eligibility required after 25th visit   PT Start Time  0915    PT Stop Time  0958    PT Time Calculation (min)  43 min    Activity Tolerance  Patient tolerated treatment well    Behavior During Therapy  [facility] for tasks assessed/performed       Past Medical History:  Diagnosis Date  . Anemia   . Anxiety 03/17/2015  . BV (bacterial vaginosis) 04/10/2014  . Chronic lower back pain    2 herniated disc sees Dr Carloyn Manner  . Encounter for menstrual regulation 03/13/2014  . Family history of breast cancer   . Family history of melanoma   . Hemorrhoids 01/12/2015  . History of rectal bleeding 01/12/2015  . Hypothyroidism   . IC (interstitial cystitis) 01/22/2015  . Pulmonary edema 08/21/2015   S/P back OR  . Thyroid disease   . Vaginal discharge 03/13/2014  . Vaginal itching 01/12/2015  . Vestibulitis, vulvar 04/21/2014  . Yeast infection 03/13/2014    Past Surgical History:  Procedure Laterality Date  . BACK SURGERY    . LAMINECTOMY AND MICRODISCECTOMY LUMBAR SPINE Right 08/21/2015   L4-5 and L5-S 1    There were no vitals filed for this visit.  Subjective Assessment - 08/13/18 1004    Subjective  Pt denies pain this date. Pt reports difficulty with kegel exercises at home and feels like she is just squeezing her butt.    Limitations  Walking;Lifting;Standing     How long can you sit comfortably?  no real issues    How long can you stand comfortably?  5 mins    How long can you walk comfortably?  1 mile    Diagnostic tests  MRI (see imaging tab)    Patient Stated Goals  get mm stronger and get back to being able to exercise with less pain    Currently in Pain?  No/denies         [facility] PT Assessment - 08/13/18 0001      Special Tests    Special Tests  Sacrolliac Tests    Sacroiliac Tests   Sacral Compression      Pelvic Dictraction   Findings  Negative      Pelvic Compression   Findings  Positive    Side  Right    comment  negative on L      Sacral thrust    Findings  Positive      Gaenslen's test   Findings  Positive    Side   Right    Comments  negative on L      Sacral Compression   Findings  Negative    Side   Right    Comments  scour negative bilaterally  [place] Adult PT Treatment/Exercise - 08/13/18 0001      Knee/Hip Exercises: Stretches   Active Hamstring Stretch  Both;3 reps;30 seconds    Active Hamstring Stretch Limitations  standing at 12" step      Knee/Hip Exercises: Supine   Bridges  Both;10 reps    Bridges Limitations  cues for TA contraction prior to rising    Other Supine Knee/Hip Exercises  TA contraction with exhale, x20 reps; TA contraction with exhale and kegels, 2x10 reps    Other Supine Knee/Hip Exercises  isometric glute contraction, x10 reps; isometric glute contraction with kegel x10 reps      Knee/Hip Exercises: Sidelying   Clams  BLE, x10 reps, RTB   cues to maintain stacked hips       PT Education - 08/13/18 1001    Education Details  SIJ cluster testing findings, exercise technique, updated HEP    Person(s) Educated  Patient    Methods  Explanation;Demonstration;Tactile cues;Verbal cues;Handout    Comprehension  Verbalized understanding;Returned demonstration;Verbal cues required       PT Short Term Goals - 08/07/18 0840      PT SHORT TERM GOAL #1   Title  Pt will be  able to perform R SLS for 20sec without R trunk lean in order to demo improved functional hip and core strength to maximize gait.    Time  2    Period  Weeks    Status  On-going    Target Date  08/08/18      PT SHORT TERM GOAL #2   Title  Pt will report being able to stand for 10 mins or > with 2/10 LBP and R hip pain in order to demo improved tolerance to functional tasks and maximize function at work.    Time  2    Period  Weeks    Status  On-going      PT SHORT TERM GOAL #3   Title  Pt will have improved ODI by 10% or > to demo reduced self-perceived disability due to LBP.    Time  2    Period  Weeks    Status  On-going        PT Long Term Goals - 08/07/18 0841      PT LONG TERM GOAL #1   Title  Pt will have improved MMT by 1/2 grade in order to reduce LBP and maximize overall function.    Time  3    Period  Weeks    Status  On-going      PT LONG TERM GOAL #2   Title  Pt will have reduced soft tissue restrictions throughout lumbar paraspinals and glutes to moderate in order to improve lumbar ROM and reduce overall pain.    Time  3    Period  Weeks    Status  On-going      PT LONG TERM GOAL #3   Title  Pt will be able to participate in exercise routine for 15 mins with 2/10 LBP in order to return to PLOF.    Time  3    Period  Weeks    Status  New      PT LONG TERM GOAL #4   Title  Pt will report being able to walk 2 miles with 2/10 LBP and R hip pain or better in order to demo improved tolerance to functional mobility and maximize function at work.    Time  3    Period  Weeks    Status  On-going            Plan - 08/13/18 8921    Clinical Impression Statement  Continued with established POC for strengthening core muscles and improving flexibility. Completed SIJ cluster testing this date, pt with 3 positive tests (Gaenslon's on R, sacral thrust, sacral compression on R) and 2 negative tests (score bil, sacral distraction). Initiated TA contractions with  breathing inhale/exhale to assist with proper technique. Added isometric glute squeeze and continued with kegel exercises to improve deep core strength. Pt continues to require verbal cues for form to contract correct muscles and verbalizes some difficulty initially with exercise technique, but reports if she thinks about it really hard and does a bunch, it gets easier to perform the TA and kegels. Added resistance band for increased glute contraction. Progressed HEP with additional strengthening exercises at home and educated pt to engage TA contraction and kegel contractions with exercises to further strengthen in variety of positions and with extremities moving. Pt denies pain at EOS and reports better kegel contractions felt with new verbal cues and repeated reps. Pt due for reassessment at next appointment. Continue to progress pt as able.    Personal Factors and Comorbidities  Past/Current Experience;Time since onset of injury/illness/exacerbation;Comorbidity 3+    Comorbidities  see above    Examination-Activity Limitations  Lift;Locomotion Level    Examination-Participation Restrictions  Other   work   Stability/Clinical Decision Making  Stable/Uncomplicated    Rehab Potential  Good    PT Frequency  2x / week    PT Duration  3 weeks    PT Treatment/Interventions  ADLs/Self Care Home Management;Cryotherapy;Electrical Stimulation;Moist Heat;Ultrasound;Gait training;Stair training;Functional mobility training;Therapeutic activities;Therapeutic exercise;Balance training;Neuromuscular re-education;Patient/family education;Orthotic Fit/Training;Manual techniques;Scar mobilization;Passive range of motion;Dry needling;Taping;Spinal Manipulations;Joint Manipulations    PT Next Visit Plan  Reassessment. Continue to progress core strength (pelvic floor activation, TA engagement, etc.). Begin thoracic and hip mobility exercises, balance activities.  Complete STM for soft tissue restrictions and joint mobs for  pain and mobility PRN.  Follow up on kegals and additional exercises completing at home.    PT Home Exercise Plan  eval: LTRs, seated figure 4 (pulling knee towards chest) 6/30: kegals long holds and fast twitch; 7/6: bridging, clams with RTB, TA contraction    Consulted and Agree with Plan of Care  Patient       Patient will benefit from skilled therapeutic intervention in order to improve the following deficits and impairments:  Decreased activity tolerance, Decreased endurance, Decreased balance, Decreased range of motion, Decreased strength, Difficulty walking, Hypomobility, Increased fascial restricitons, Increased muscle spasms, Impaired flexibility, Improper body mechanics, Postural dysfunction, Obesity, Pain  Visit Diagnosis: 1. Chronic bilateral low back pain without sciatica   2. Difficulty in walking, not elsewhere classified   3. Abnormal posture        Problem List Patient Active Problem List   Diagnosis Date Noted  . 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 Family history of breast cancer   . Family history of melanoma   . Urge incontinence 03/23/2016  . Diarrhea 12/16/2015  . Change in bowel habits 12/16/2015  . Generalized abdominal pain 12/16/2015  . Rectal bleeding 12/16/2015  . Pulmonary edema 08/21/2015  . Anxiety 03/17/2015  . IC (interstitial cystitis) 01/22/2015  . Vaginal itching 01/12/2015  . Hemorrhoids 01/12/2015  . History of rectal bleeding 01/12/2015  . Vestibulitis, vulvar 04/21/2014  . BV (bacterial vaginosis) 04/10/2014  . Vaginal discharge  03/13/2014  . Yeast infection 03/13/2014  . Encounter for menstrual regulation 03/13/2014    Talbot Grumbling PT, DPT  [place] [address] [place], [place], [address] Phone: [phone]   Fax:  [phone]  Name: Karen Hoover MRN: [id number] Date of Birth: Jun 12, 1990

## 2018-08-15 ENCOUNTER — Other Ambulatory Visit: Payer: Self-pay

## 2018-08-15 ENCOUNTER — Ambulatory Visit (HOSPITAL_COMMUNITY): Payer: 59

## 2018-08-15 ENCOUNTER — Encounter (HOSPITAL_COMMUNITY): Payer: Self-pay

## 2018-08-15 DIAGNOSIS — G8929 Other chronic pain: Secondary | ICD-10-CM

## 2018-08-15 DIAGNOSIS — R262 Difficulty in walking, not elsewhere classified: Secondary | ICD-10-CM

## 2018-08-15 DIAGNOSIS — R293 Abnormal posture: Secondary | ICD-10-CM

## 2018-08-15 DIAGNOSIS — M545 Low back pain, unspecified: Secondary | ICD-10-CM

## 2018-08-15 NOTE — Therapy (Addendum)
[place], [place], [address] Phone: [phone]   Fax:  [phone]   Progress Note Reporting Period 07/25/18 to 08/15/18  See note below for Objective Data and Assessment of Progress/Goals.    This PT has read and reviewed this note and agree with its findings. Recommend brief extension of cert to cover remaining approved visits to conitnue to address impairments. If still no improvements, will refer pt to pelvic health Physical Therapist for internal assessment/exam/treatment of pt's incontinence.   Geraldine Solar PT, DPT   Physical Therapy Treatment  Patient Details  Name: Karen Hoover MRN: [id number] Date of Birth: 1990-08-08 Referring Provider (PT): Erline Levine, MD   Encounter Date: 08/15/2018  PT End of Session - 08/15/18 1728    Visit Number  4    Number of Visits  6    Date for PT Re-Evaluation  08/23/18    Authorization Type  [facility]    Authorization Time Period  07/25/18 to 08/15/18; 08/16/18 to 08/23/18   Authorization - Visit Number  4    Authorization - Number of Visits  25   clinical eligibility required after 25th visit   PT Start Time  1723    PT Stop Time  1808    PT Time Calculation (min)  45 min    Activity Tolerance  Patient tolerated treatment well    Behavior During Therapy  [facility] for tasks assessed/performed       Past Medical History:  Diagnosis Date  . Anemia   . Anxiety 03/17/2015  . BV (bacterial vaginosis) 04/10/2014  . Chronic lower back pain    2 herniated disc sees Dr Carloyn Manner  . Encounter for menstrual regulation 03/13/2014  . Family history of breast cancer   . Family history of melanoma   . Hemorrhoids 01/12/2015  . History of rectal bleeding 01/12/2015  . Hypothyroidism   . IC (interstitial cystitis) 01/22/2015  . Pulmonary edema 08/21/2015   S/P back OR  . Thyroid disease   . Vaginal discharge 03/13/2014  . Vaginal itching 01/12/2015  . Vestibulitis, vulvar 04/21/2014  .  Yeast infection 03/13/2014    Past Surgical History:  Procedure Laterality Date  . BACK SURGERY    . LAMINECTOMY AND MICRODISCECTOMY LUMBAR SPINE Right 08/21/2015   L4-5 and L5-S 1    There were no vitals filed for this visit.  Subjective Assessment - 08/15/18 1727    Subjective  Feeling good today, went to Chiropractor earlier today and got adjusted so feels good.  Has been compliant with HEP daily    How long can you stand comfortably?  Able to stand comfortably for 2 hours (was 5 mins)    How long can you walk comfortably?  1 mile    Currently in Pain?  No/denies         [facility] PT Assessment - 08/15/18 0001      Assessment   Medical Diagnosis  Lumbar radiculopathy    Referring Provider (PT)  Erline Levine, MD    Onset Date/Surgical Date  --   2014   Next MD Visit  August 2020    Prior Therapy  no PT, chiropractic work for present issue      Observation/Other Assessments   Other Surveys   Oswestry Disability Index    Oswestry Disability Index   20% limitation (10/50*100)      ROM / Strength   AROM / PROM / Strength  AROM;Strength  AROM   AROM Assessment Site  Lumbar    Lumbar Flexion  25% limited; Lumbar lordosis limited   was 50% limited; lumbar lordosis limited; RFISx10 no change   Lumbar Extension  WFL no pain just pulls   Was WFL; REIS x 20 no change   Lumbar - Right Side Bend  just above knee    Lumbar - Left Side Bend  just above knee    Lumbar - Right Rotation  WNL   was 25% limited   Lumbar - Left Rotation  10% limited   was 25% limited     Strength   Right Hip Flexion  5/5   was 4+/5   Right Hip Extension  4-/5   was 4-/5   Right Hip ABduction  4+/5   was 4/5   Left Hip Flexion  4+/5   was 4+/5   Left Hip Extension  4/5   was 4/5   Left Hip ABduction  4+/5   was 4/5   Right Knee Flexion  5/5   was 4+   Right Knee Extension  5/5    Left Knee Flexion  4+/5   was 4+   Left Knee Extension  5/5    Right Ankle Dorsiflexion  5/5    Left Ankle  Dorsiflexion  5/5      Flexibility   Hamstrings  90/90: WNL BLE    Quadriceps  Ely's negative    Piriformis  L tighter than R      Static Standing Balance   Static Standing - Balance Support  No upper extremity supported                   [facility] Adult PT Treatment/Exercise - 08/15/18 0001      Knee/Hip Exercises: Supine   Bridges  Both;10 reps    Bridges Limitations  cues for TA contraction prior to rising    Other Supine Knee/Hip Exercises  TA contraction with exhale, x20 reps; TA contraction with exhale and kegels, 2x10 reps    Other Supine Knee/Hip Exercises  isometric glute contraction, x10 reps; isometric glute contraction with kegel x10 reps      Knee/Hip Exercises: Sidelying   Clams  BLE, x10 reps, RTB               PT Short Term Goals - 08/15/18 1748      PT SHORT TERM GOAL #1   Title  Pt will be able to perform R SLS for 20sec without R trunk lean in order to demo improved functional hip and core strength to maximize gait.    Baseline  08/15/18: Multiple attempts with improved SLS following core activation, required cueing to activate prior attempt with average BLE 3-7"; with ab set able to stand for BLE 15"      PT SHORT TERM GOAL #2   Title  Pt will report being able to stand for 10 mins or > with 2/10 LBP and R hip pain in order to demo improved tolerance to functional tasks and maximize function at work.    Baseline  08/15/18:  Reports ability to stand for 2 hours comfortably without pain      PT SHORT TERM GOAL #3   Title  Pt will have improved ODI by 10% or > to demo reduced self-perceived disability due to LBP.    Baseline  08/15/18: ODI at 20%    Status  On-going        PT Long Term Goals -  08/15/18 1750      PT LONG TERM GOAL #1   Title  Pt will have improved MMT by 1/2 grade in order to reduce LBP and maximize overall function.    Baseline  08/15/18: see MMT      PT LONG TERM GOAL #2   Title  Pt will have reduced soft tissue restrictions  throughout lumbar paraspinals and glutes to moderate in order to improve lumbar ROM and reduce overall pain.    Baseline  08/15/18: tissues not assessed, no pain does reoprts some pulling    Status  On-going      PT LONG TERM GOAL #3   Title  Pt will be able to participate in exercise routine for 15 mins with 2/10 LBP in order to return to PLOF.    Baseline  08/15/18    Status  On-going      PT LONG TERM GOAL #4   Title  Pt will report being able to walk 2 miles with 2/10 LBP and R hip pain or better in order to demo improved tolerance to functional mobility and maximize function at work.    Baseline  08/15/18: Reports ability to walk for 1 mile without pain, does c/o tightness Rt hip    Status  On-going            Plan - 08/15/18 1813    Clinical Impression Statement  Reviewed goals with the following findings:  Pt reports reduction in back pain with ability to stand for 2 hours comfortably.  Pt continues to show deficits including abdominal weakness wiht inability to hold kegal exercises for longer that 5" prior compensation with gluteal activation and continues to report incontinence daily/weekly.  Pt has made some improvements with strength though continues to have weakness in gluteal muscualture.  Initially unable to SLS 4-7" BLE, with cueing for visual focus and abdominal contraction able to SLS for 15" BLE.  ODI score of 20% limitation.  Session focus on abdominal strengthening with multimodal cueing for proper kegal activation, improved mechanics iwht supine vs sidelying position.  No reports of pain through session.    Personal Factors and Comorbidities  Past/Current Experience;Time since onset of injury/illness/exacerbation;Comorbidity 3+    Comorbidities  see above    Examination-Activity Limitations  Lift;Locomotion Level    Examination-Participation Restrictions  Other   work   Stability/Clinical Decision Making  Stable/Uncomplicated    Clinical Decision Making  Low    Rehab  Potential  Good    PT Frequency  2x / week    PT Duration  1 weeks    PT Treatment/Interventions  ADLs/Self Care Home Management;Cryotherapy;Electrical Stimulation;Moist Heat;Ultrasound;Gait training;Stair training;Functional mobility training;Therapeutic activities;Therapeutic exercise;Balance training;Neuromuscular re-education;Patient/family education;Orthotic Fit/Training;Manual techniques;Scar mobilization;Passive range of motion;Dry needling;Taping;Spinal Manipulations;Joint Manipulations    PT Next Visit Plan  Next session begin kegal exercises in quadruped.  Continue to progress core strength (pelvic floor activation, TA engagement, etc.). Begin thoracic and hip mobility exercises, balance activities.  Complete STM for soft tissue restrictions and joint mobs for pain and mobility PRN.  Follow up on kegals and additional exercises completing at home.    PT Home Exercise Plan  eval: LTRs, seated figure 4 (pulling knee towards chest) 6/30: kegals long holds and fast twitch; 7/6: bridging, clams with RTB, TA contraction       Patient will benefit from skilled therapeutic intervention in order to improve the following deficits and impairments:  Decreased activity tolerance, Decreased endurance, Decreased balance, Decreased range of motion, Decreased  strength, Difficulty walking, Hypomobility, Increased fascial restricitons, Increased muscle spasms, Impaired flexibility, Improper body mechanics, Postural dysfunction, Obesity, Pain  Visit Diagnosis: 1. Chronic bilateral low back pain without sciatica   2. Difficulty in walking, not elsewhere classified   3. Abnormal posture        Problem List Patient Active Problem List   Diagnosis Date Noted  . 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 Family history of breast cancer   . Family history of melanoma   . Urge incontinence 03/23/2016  . Diarrhea 12/16/2015  . Change in bowel habits 12/16/2015  . Generalized  abdominal pain 12/16/2015  . Rectal bleeding 12/16/2015  . Pulmonary edema 08/21/2015  . Anxiety 03/17/2015  . IC (interstitial cystitis) 01/22/2015  . Vaginal itching 01/12/2015  . Hemorrhoids 01/12/2015  . History of rectal bleeding 01/12/2015  . Vestibulitis, vulvar 04/21/2014  . BV (bacterial vaginosis) 04/10/2014  . Vaginal discharge 03/13/2014  . Yeast infection 03/13/2014  . Encounter for menstrual regulation 03/13/2014   Ihor Austin, [place]; [place]  Aldona Lento 08/15/2018, 6:30 PM  [place] [address] [place], [place], [address] Phone: [phone]   Fax:  [phone]  Name: Calista Crain MRN: [id number] Date of Birth: 1990-10-31

## 2018-08-16 NOTE — Addendum Note (Signed)
Addended by: Geralyn Corwin on: 08/16/2018 09:57 AM   Modules accepted: Orders

## 2018-08-20 ENCOUNTER — Encounter (HOSPITAL_COMMUNITY): Payer: Self-pay | Admitting: Physical Therapy

## 2018-08-20 ENCOUNTER — Ambulatory Visit (HOSPITAL_COMMUNITY): Payer: 59 | Admitting: Physical Therapy

## 2018-08-20 ENCOUNTER — Other Ambulatory Visit: Payer: Self-pay

## 2018-08-20 DIAGNOSIS — G8929 Other chronic pain: Secondary | ICD-10-CM

## 2018-08-20 DIAGNOSIS — R293 Abnormal posture: Secondary | ICD-10-CM | POA: Diagnosis not present

## 2018-08-20 DIAGNOSIS — R262 Difficulty in walking, not elsewhere classified: Secondary | ICD-10-CM

## 2018-08-20 DIAGNOSIS — M545 Low back pain: Secondary | ICD-10-CM | POA: Diagnosis not present

## 2018-08-20 NOTE — Therapy (Signed)
[place], [place], [address] Phone: [phone]   Fax:  [phone]  Physical Therapy Treatment  Patient Details  Name: Karen Hoover MRN: [id number] Date of Birth: February 15, 1990 Referring Provider (PT): Erline Levine, MD   Encounter Date: 08/20/2018  PT End of Session - 08/20/18 0838    Visit Number  5    Number of Visits  6    Date for PT Re-Evaluation  08/23/18    Authorization Type  [facility]    Authorization Time Period  07/25/18 to 08/15/18; 08/16/18 to 08/23/18    Authorization - Visit Number  5    Authorization - Number of Visits  25   clinical eligibility required after 25th visit   PT Start Time  0840   Patient arrived late   PT Stop Time  0918    PT Time Calculation (min)  38 min    Activity Tolerance  Patient tolerated treatment well    Behavior During Therapy  [facility] for tasks assessed/performed       Past Medical History:  Diagnosis Date  . Anemia   . Anxiety 03/17/2015  . BV (bacterial vaginosis) 04/10/2014  . Chronic lower back pain    2 herniated disc sees Dr Carloyn Manner  . Encounter for menstrual regulation 03/13/2014  . Family history of breast cancer   . Family history of melanoma   . Hemorrhoids 01/12/2015  . History of rectal bleeding 01/12/2015  . Hypothyroidism   . IC (interstitial cystitis) 01/22/2015  . Pulmonary edema 08/21/2015   S/P back OR  . Thyroid disease   . Vaginal discharge 03/13/2014  . Vaginal itching 01/12/2015  . Vestibulitis, vulvar 04/21/2014  . Yeast infection 03/13/2014    Past Surgical History:  Procedure Laterality Date  . BACK SURGERY    . LAMINECTOMY AND MICRODISCECTOMY LUMBAR SPINE Right 08/21/2015   L4-5 and L5-S 1    There were no vitals filed for this visit.  Subjective Assessment - 08/20/18 0847    Subjective  Patient reported no pain today. No complaints.    How long can you stand comfortably?  Able to stand comfortably for 2 hours (was 5 mins)    How long can  you walk comfortably?  1 mile    Currently in Pain?  No/denies                       [facility] Adult PT Treatment/Exercise - 08/20/18 0001      Exercises   Exercises  Lumbar      Lumbar Exercises: Stretches   Other Lumbar Stretch Exercise  Thoracic book openers 10x10''     Active Hamstring Stretch  Both;3 reps;30 seconds    Active Hamstring Stretch Limitations  standing at 12" step      Lumbar Exercises: Quadruped   Single Arm Raise  Right;Left;5 reps   alternating arms   Straight Leg Raise  10 reps    Straight Leg Raises Limitations  Abdominal activation x 10 alternating sides      Knee/Hip Exercises: Supine   Other Supine Knee/Hip Exercises  TA contraction with exhale, x20 reps; TA contraction with exhale and kegels, 2x10 reps             PT Education - 08/20/18 0850    Education Details  Purpose and technique of interventions throughout.    Person(s) Educated  Patient    Methods  Explanation    Comprehension  Verbalized understanding       PT Short Term Goals - 08/15/18 1748      PT SHORT TERM GOAL #1   Title  Pt will be able to perform R SLS for 20sec without R trunk lean in order to demo improved functional hip and core strength to maximize gait.    Baseline  08/15/18: Multiple attempts with improved SLS following core activation, required cueing to activate prior attempt with average BLE 3-7"; with ab set able to stand for BLE 15"      PT SHORT TERM GOAL #2   Title  Pt will report being able to stand for 10 mins or > with 2/10 LBP and R hip pain in order to demo improved tolerance to functional tasks and maximize function at work.    Baseline  08/15/18:  Reports ability to stand for 2 hours comfortably without pain      PT SHORT TERM GOAL #3   Title  Pt will have improved ODI by 10% or > to demo reduced self-perceived disability due to LBP.    Baseline  08/15/18: ODI at 20%    Status  On-going        PT Long Term Goals - 08/15/18 1750      PT  LONG TERM GOAL #1   Title  Pt will have improved MMT by 1/2 grade in order to reduce LBP and maximize overall function.    Baseline  08/15/18: see MMT      PT LONG TERM GOAL #2   Title  Pt will have reduced soft tissue restrictions throughout lumbar paraspinals and glutes to moderate in order to improve lumbar ROM and reduce overall pain.    Baseline  08/15/18: tissues not assessed, no pain does reoprts some pulling    Status  On-going      PT LONG TERM GOAL #3   Title  Pt will be able to participate in exercise routine for 15 mins with 2/10 LBP in order to return to PLOF.    Baseline  08/15/18    Status  On-going      PT LONG TERM GOAL #4   Title  Pt will report being able to walk 2 miles with 2/10 LBP and R hip pain or better in order to demo improved tolerance to functional mobility and maximize function at work.    Baseline  08/15/18: Reports ability to walk for 1 mile without pain, does c/o tightness Rt hip    Status  On-going            Plan - 08/20/18 0901    Clinical Impression Statement  Patient arrived late and therefore session was limited due to this. Began thoracic mobility exercise this session including thoracic book openers. Continued with hip stretching with hamstring stretch. Continued with abdominal activation exercises incorporating kegels throughout and progressed to quadruped exercises this session. Discussed plan to re-assess patient at next visit.    Personal Factors and Comorbidities  Past/Current Experience;Time since onset of injury/illness/exacerbation;Comorbidity 3+    Comorbidities  see above    Examination-Activity Limitations  Lift;Locomotion Level    Examination-Participation Restrictions  Other   work   Stability/Clinical Decision Making  Stable/Uncomplicated    Rehab Potential  Good    PT Frequency  2x / week    PT Duration  3 weeks    PT Treatment/Interventions  ADLs/Self Care Home Management;Cryotherapy;Electrical Stimulation;Moist  Heat;Ultrasound;Gait training;Stair training;Functional mobility training;Therapeutic activities;Therapeutic exercise;Balance training;Neuromuscular re-education;Patient/family education;Orthotic Fit/Training;Manual techniques;Scar mobilization;Passive  range of motion;Dry needling;Taping;Spinal Manipulations;Joint Manipulations    PT Next Visit Plan  Re-assess next visit.  Continue to progress core strength (pelvic floor activation, TA engagement, etc.). Begin thoracic and hip mobility exercises, balance activities.  Complete STM for soft tissue restrictions and joint mobs for pain and mobility PRN.  Follow up on kegals and additional exercises completing at home.    PT Home Exercise Plan  eval: LTRs, seated figure 4 (pulling knee towards chest) 6/30: kegals long holds and fast twitch; 7/6: bridging, clams with RTB, TA contraction       Patient will benefit from skilled therapeutic intervention in order to improve the following deficits and impairments:  Decreased activity tolerance, Decreased endurance, Decreased balance, Decreased range of motion, Decreased strength, Difficulty walking, Hypomobility, Increased fascial restricitons, Increased muscle spasms, Impaired flexibility, Improper body mechanics, Postural dysfunction, Obesity, Pain  Visit Diagnosis: 1. Chronic bilateral low back pain without sciatica   2. Difficulty in walking, not elsewhere classified   3. Abnormal posture        Problem List Patient Active Problem List   Diagnosis Date Noted  . 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 Family history of breast cancer   . Family history of melanoma   . Urge incontinence 03/23/2016  . Diarrhea 12/16/2015  . Change in bowel habits 12/16/2015  . Generalized abdominal pain 12/16/2015  . Rectal bleeding 12/16/2015  . Pulmonary edema 08/21/2015  . Anxiety 03/17/2015  . IC (interstitial cystitis) 01/22/2015  . Vaginal itching 01/12/2015  . Hemorrhoids  01/12/2015  . History of rectal bleeding 01/12/2015  . Vestibulitis, vulvar 04/21/2014  . BV (bacterial vaginosis) 04/10/2014  . Vaginal discharge 03/13/2014  . Yeast infection 03/13/2014  . Encounter for menstrual regulation 03/13/2014   Clarene Critchley PT, DPT 9:23 AM, 08/20/18 [place], [place], [address] Phone: [phone]   Fax:  [phone]  Name: Karen Hoover MRN: [id number] Date of Birth: Nov 01, 1990

## 2018-08-21 MED FILL — LEVOTHYROXINE 75 MCG TABLET: 75 | 30 days supply | Qty: 30 | Fill #1

## 2018-08-21 MED FILL — ESCITALOPRAM 20 MG TABLET: 20 | 30 days supply | Qty: 30 | Fill #1

## 2018-08-23 ENCOUNTER — Ambulatory Visit (HOSPITAL_COMMUNITY): Payer: 59

## 2018-08-23 ENCOUNTER — Telehealth (HOSPITAL_COMMUNITY): Payer: Self-pay | Admitting: Internal Medicine

## 2018-08-23 ENCOUNTER — Encounter (HOSPITAL_COMMUNITY): Payer: Self-pay

## 2018-08-23 ENCOUNTER — Telehealth (HOSPITAL_COMMUNITY): Payer: Self-pay

## 2018-08-23 ENCOUNTER — Telehealth: Payer: Self-pay | Admitting: Adult Health

## 2018-08-23 NOTE — Telephone Encounter (Signed)
Pt states that she is having abdominal pain and right breast pain that started at 1 am this morning . She thinks this could be related to her menstrual cycle but is unsure. Requesting to discuss with a nurse.

## 2018-08-23 NOTE — Telephone Encounter (Signed)
Called patient back, she states that she is feeling better now. No further questions at this time.

## 2018-08-23 NOTE — Therapy (Unsigned)
[place], [place], [address] Phone: [phone]   Fax:  [phone]  Patient Details  Name: Darlisha Kelm MRN: [id number] Date of Birth: 03-15-1990 Referring Provider:  No ref. provider found  Encounter Date: 08/23/2018   PHYSICAL THERAPY DISCHARGE SUMMARY  Visits from Start of Care: 5  Current functional level related to goals / functional outcomes: See last treatment note   Remaining deficits: See last treatment note   Education / Equipment: See last treatment  Plan: Patient agrees to discharge.  Patient goals were not met. Patient is being discharged due to meeting the stated rehab goals.  ?????            Due to lack of progress with incontinence and pain, referring pt to pelvic health therapist for internal physical therapy interventions    Talbot Grumbling PT, DPT   [place], [place], [address] Phone: [phone]   Fax:  [phone]

## 2018-08-23 NOTE — Telephone Encounter (Signed)
Pt called front desk staff and cancelled appointment this date (08/23/18 at 8:15) due to being in pain. This therapist returned pt's call regarding today being discharge from therapy at this clinic, sent referral to pt's doctor to request therapy for urinary incontinence with pelvic health specialist, and to further inquire about pt's pain. Pt reports she went to chiropractor yesterday for readjustment and chiropractor told pt the readjustment went well. Pt reports she began experiencing some pain around 10PM last night and then woke up at 3AM this morning due to pain being so severe in stomach, R>L. Pt reports she is currently on her period so isn't sure if the pain is from her period, soreness from therapy, or chiropractic adjustment. Therapist inquired if pt has ever experienced post therapy soreness like this or menstrual symptoms like this and pt reports no to both. Therapist educated pt to reach out to PCP regarding current pain complaints and to seek further medical advice and pt reported understanding. Therapist presented options to discharge from therapy this date and to begin therapy at [facility] for pelvic health or to return to this clinic for discharge session and then begin therapy at [facility] for pelvic health. Pt chose to discharge from therapy this date via phone call and wants to begin therapy at [facility] for pelvic health. Educated pt that [facility] would call her to schedule evaluation and that the referral has already been sent to and approved by her PCP. Again, reeducated pt to reach out to her PCP for current pain complaints.  Talbot Grumbling PT, DPT

## 2018-08-23 NOTE — Telephone Encounter (Signed)
08/23/18  pt left a message this morning after 7am to say that she needed to rescehdule... she is in extreme pain

## 2018-08-29 ENCOUNTER — Encounter: Payer: Self-pay | Admitting: *Deleted

## 2018-08-29 DIAGNOSIS — G4733 Obstructive sleep apnea (adult) (pediatric): Secondary | ICD-10-CM | POA: Diagnosis not present

## 2018-08-29 NOTE — Telephone Encounter (Signed)
Pt states this week she is still having abdominal pain and vaginal pain and requesting to speak with the nurse.

## 2018-08-30 ENCOUNTER — Other Ambulatory Visit: Payer: Self-pay

## 2018-08-30 ENCOUNTER — Ambulatory Visit (INDEPENDENT_AMBULATORY_CARE_PROVIDER_SITE_OTHER): Payer: 59 | Admitting: Adult Health

## 2018-08-30 ENCOUNTER — Encounter: Payer: Self-pay | Admitting: Adult Health

## 2018-08-30 VITALS — BP 144/86 | HR 89 | Ht 65.0 in | Wt 266.0 lb

## 2018-08-30 DIAGNOSIS — R309 Painful micturition, unspecified: Secondary | ICD-10-CM | POA: Diagnosis not present

## 2018-08-30 DIAGNOSIS — R102 Pelvic and perineal pain: Secondary | ICD-10-CM | POA: Diagnosis not present

## 2018-08-30 DIAGNOSIS — R1011 Right upper quadrant pain: Secondary | ICD-10-CM | POA: Diagnosis not present

## 2018-08-30 DIAGNOSIS — R110 Nausea: Secondary | ICD-10-CM

## 2018-08-30 LAB — POCT URINALYSIS DIPSTICK
Glucose, UA: NEGATIVE
Leukocytes, UA: NEGATIVE
Nitrite, UA: NEGATIVE
Protein, UA: NEGATIVE

## 2018-08-30 NOTE — Progress Notes (Signed)
Patient ID: Karen Hoover, female   DOB: 1990/05/03, 28 y.o.   MRN: [id number] History of Present Illness: Karen Hoover is a 28 year old white female, single, G0P0, in complaining of pain in stomach and cramps,it started last week, on Wednesday was worse Thursday and not as bad now. PCP Is Dr Ardeth Perfect    Current Medications, Allergies, Past Medical History, Past Surgical History, Family History and Social History were reviewed in [facility] record.     Review of Systems: +pain in stomach started last Wednesday was worse Thursday not as bad +cramps, and burned some when peed last night  +nausea at times Denies any vomiting no sex in over 6 months    Physical Exam:BP (!) 144/86 (BP Location: Left Arm, Patient Position: Sitting, Cuff Size: Normal)   Pulse 89   Ht 5\' 5"  (1.651 m)   Wt 266 lb (120.7 kg)   LMP 08/22/2018   BMI 44.26 kg/m  urine dipstick trace ketones and trace blood.  General:  Well developed, well nourished, no acute distress Skin:  Warm and dry Abdomen:  Soft, +RUQ tenderness, no hepatosplenomegaly,obese Pelvic:  External genitalia is normal in appearance, no lesions.  The vagina is normal in appearance. Urethra has no lesions or masses. The cervix is nulliparous.  Uterus is felt to be normal size, shape, and contour,mildly tender.  No adnexal masses, mild tenderness noted, across lower pelvic area.Bladder is non tender, no masses felt. Psych:  No mood changes, alert and cooperative,seems happy Fall risk is low. Examination chaperoned by Levy Pupa LPN.   Impression: 1. Pelvic pain   2. Pain with urination   3. RUQ pain   4. Nausea       Plan: Will get abdominal and pelvic US at [facility] 7/28 at 9:30 am She has physical in September  Do not eat greasy, fried, spicy foods, increase water

## 2018-09-03 ENCOUNTER — Other Ambulatory Visit: Payer: Self-pay

## 2018-09-03 ENCOUNTER — Ambulatory Visit: Payer: 59 | Attending: Neurosurgery

## 2018-09-03 DIAGNOSIS — M62838 Other muscle spasm: Secondary | ICD-10-CM | POA: Insufficient documentation

## 2018-09-03 DIAGNOSIS — M5440 Lumbago with sciatica, unspecified side: Secondary | ICD-10-CM | POA: Insufficient documentation

## 2018-09-03 DIAGNOSIS — R293 Abnormal posture: Secondary | ICD-10-CM | POA: Insufficient documentation

## 2018-09-03 DIAGNOSIS — G8929 Other chronic pain: Secondary | ICD-10-CM | POA: Insufficient documentation

## 2018-09-03 NOTE — Therapy (Signed)
[place] MAIN [facility] SERVICES [address] [place], [place], [address] Phone: [phone]   Fax:  [phone]  Physical Therapy Evaluation  The patient has been informed of current processes in place at Outpatient Rehab to protect patients from Covid-19 exposure including social distancing, schedule modifications, and new cleaning procedures. After discussing their particular risk with a therapist based on the patient's personal risk factors, the patient has decided to proceed with in-person therapy.  Patient Details  Name: Karen Hoover MRN: [id number] Date of Birth: 01-01-1991 Referring Provider (PT): Erline Levine   Encounter Date: 09/03/2018  PT End of Session - 09/05/18 0918    Visit Number  1    Number of Visits  10    Date for PT Re-Evaluation  11/14/18    Authorization Type  [facility]    Authorization Time Period  through 11/14/2018    Authorization - Visit Number  1    Authorization - Number of Visits  10   clinical eligibility required after 25th visit   PT Start Time  0835    PT Stop Time  0935    PT Time Calculation (min)  60 min    Activity Tolerance  Patient tolerated treatment well    Behavior During Therapy  [facility] for tasks assessed/performed       Past Medical History:  Diagnosis Date  . Anemia   . Anxiety 03/17/2015  . BV (bacterial vaginosis) 04/10/2014  . Chronic lower back pain    2 herniated disc sees Dr Carloyn Manner  . Encounter for menstrual regulation 03/13/2014  . Family history of breast cancer   . Family history of melanoma   . Hemorrhoids 01/12/2015  . History of rectal bleeding 01/12/2015  . Hypothyroidism   . IC (interstitial cystitis) 01/22/2015  . Pulmonary edema 08/21/2015   S/P back OR  . Thyroid disease   . Vaginal discharge 03/13/2014  . Vaginal itching 01/12/2015  . Vestibulitis, vulvar 04/21/2014  . Yeast infection 03/13/2014    Past Surgical History:  Procedure Laterality Date  . BACK SURGERY    .  LAMINECTOMY AND MICRODISCECTOMY LUMBAR SPINE Right 08/21/2015   L4-5 and L5-S 1    There were no vitals filed for this visit.       [facility] PT Assessment - 09/05/18 0001      Assessment   Medical Diagnosis  SUI    Referring Provider (PT)  Erline Levine    Prior Therapy  PFPT ~ 1.5 years prior ago, chiropractor      Balance Screen   Has the patient fallen in the past 6 months  Yes    How many times?  [facility]  Level entry    Home Layout  One level      Prior Function   Level of Independence  Independent    Vocation  Full time employment    Doctor, hospital   Overall Cognitive Status  Within Functional Limits for tasks assessed        Pelvic Floor Physical Therapy Evaluation and Assessment  SCREENING  Falls in last 6 mo: yes, "clumsy" fell walking the dog  Patient's communication preference:   Red Flags:  Have you had any night sweats? Yes, few  months prior ~1x/week Unexplained weight loss? no Saddle anesthesia? Yes, a few months intermittent. Unexplained changes in bowel or bladder habits? no  SUBJECTIVE  Patient reports: Has had frequency since before laminectomy, had total incontinence before surgery, wearing panty liners now for constant low-level leakage 1/day.   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 Having a new PAP in September.  Obstetrical History: none  Gynecological History: BV,   Urinary History: See history  Gastrointestinal History: No problems  Sexual activity/pain: Pain with intercourse  Location of pain: Low back and pelvis into B thighs Current pain:  3/10  Max  pain:  10/10 Least pain:  2/10 Nature of pain: Nerve-type pain  Patient Goals: Decrease leakage, not have pain with intercourse or at rest.   OBJECTIVE  Posture/Observations:  Sitting:  Standing: L LE ER, L foot overpronates, L facing pelvis, L ilium appears high, hyperlordosis/kyphosis, L PSIS high  Palpation/Segmental Motion/Joint Play:  Special tests:   Scoliosis:  Fingers ~ 12 in. From floor, thighs feel tight, mild L rib hump. Leg-length L: 91, R: 88  Range of Motion/Flexibilty:  Spine:, SB 5 fingers B, "Pulling" with L SB, rot WNL B.  Hips:   Strength/MMT: Weakness noted in Hip EXT and ABD by prior PT, not assessed at this visit LE MMT  LE MMT Left Right  Hip flex:  (L2) /5 /5  Hip ext: /5 /5  Hip abd: /5 /5  Hip add: /5 /5  Hip IR /5 /5  Hip ER /5 /5     Abdominal:  Palpation: TTP to hip flexors, adductors B. Diastasis: not assessed  Pelvic Floor External Exam: Deferred to follow-up Introitus Appears:  Skin integrity:  Palpation: Cough: Prolapse visible?: Scar mobility:  Internal Vaginal Exam: Strength (PERF):  Symmetry: Palpation: Prolapse:   Internal Rectal Exam: Strength (PERF): Symmetry: Palpation: Prolapse:   Gait Analysis: deferred to follow-up   Pelvic Floor Outcome Measures: PFDI: 119/300, PFIQ: 42/300, Female NIH-CPSI: 23/43 (53%)  INTERVENTIONS THIS SESSION: Self-care: Educated on the structure and function of the pelvic floor in relation to their symptoms as well as the POC, and initial HEP in order to set patient expectations and understanding from which we will build on in the future sessions. Therex: Educated on and practiced seated pelvic tilts to begin improving deep-core awareness and TA strength and LB stretch to decrease pressure on lumbar nerve roots.  Total time: 60 min.           Objective measurements completed on examination: See above findings.                PT Short Term Goals -  09/05/18 0920      PT SHORT TERM GOAL #1   Title  Patient will demonstrate improved pelvic alignment and balance of musculature surrounding the pelvis to facilitate decreased PFM spasms and decrease pelvic pain.    Baseline  LLE long by 3 cm, spasms surrounding    Time  5    Period  Weeks    Status  New    Target Date  10/10/18      PT SHORT TERM GOAL #2   Title  Patient will report a reduction in pain to no greater than 5/10 over the prior week to demonstrate symptom improvement.    Baseline  Max pain of 10/10    Time  5    Period  Weeks    Status  New    Target Date  10/10/18      PT SHORT TERM GOAL #3   Title  Patient will demonstrate HEP x1 in the clinic to demonstrate understanding and proper form to allow for further improvement.    Baseline  Pt. lacks knowledge of the therepeutic exercises that will help her decrease her pain and incontinence.    Time  5    Period  Weeks    Status  New    Target Date  10/10/18        PT Long Term Goals - 09/05/18 0923      PT LONG TERM GOAL #1   Title  Patient will report no episodes of SUI/UUI over the course of the prior two weeks to demonstrate improved functional ability.    Baseline  Having leakage all day, wearing 1 pantyliner per day    Time  10    Period  Weeks    Status  New    Target Date  11/14/18      PT LONG TERM GOAL #2   Title  Patient will report no pain with intercourse to demonstrate improved functional ability.    Baseline  Pt. having pain with all intercourse    Time  10    Period  Weeks    Status  New    Target Date  11/14/18      PT LONG TERM GOAL #3   Title  Patient will score at or below 60/300 on the PFDI and 21/300 on the PFIQ to demonstrate a clinically meaningful decrease in disability and distress due to pelvic floor dysfunction.    Baseline  PFDI: 119/300, PFIQ: 42/300    Time  10    Period  Weeks    Status  New    Target Date  11/14/18      PT LONG TERM GOAL #4   Title  Patient will score  less than or equal to 20% on the Female NIH-CPSI to demonstrate a reduction in pain, urinary symptoms, and an improved quality of life.    Baseline  Female NIH-CPSI: 23/43 (53%)    Time  10    Period  Weeks    Status  New    Target Date  11/14/18      PT LONG TERM GOAL #5   Title  Patient will describe pain no greater than 2/10 during work, exercise, and ADL's to demonstrate improved functional ability.    Baseline  10/10 max pain, 2/10 at best    Time  10    Period  Weeks    Status  New    Target Date  11/14/18             Plan - 09/05/18 0762    Clinical Impression Statement  Pt. is a 28 y/o female who presents today with cheif c/o mixed incontinence and pelvic pain as well as dyspareunia. Her PMH is significant for prior laminectomy at L4-5-S1, obesity, diabetes, anxiety and depression. Her clinical exam revealed a leg-length inequality of nearly 3 cm, which is likely a combination of true and apparent, spasms surrounding the pelvis, hyperlordosis/kyphosis, and decreased lumbar ROM. She will benefit from skilled pelvic health PT to address the noted defecits and to continue to assess for and address other potential causes of Sx.    Personal Factors and Comorbidities  Comorbidity 3+;Fitness    Comorbidities  Anxiety, Depression, Diabetes, Prior laminectomy at L4-5-S1    Examination-Activity Limitations  Lift;Squat;Stand;Continence;Sit    Examination-Participation Restrictions  Interpersonal Relationship    Stability/Clinical Decision Making  Unstable/Unpredictable    Clinical Decision Making  High    Rehab Potential  Good    PT Frequency  1x / week    PT Duration  Other (comment)   10 weeks   PT Treatment/Interventions  ADLs/Self Care Home Management;Electrical Stimulation;Moist Heat;Functional mobility training;Therapeutic activities;Therapeutic exercise;Neuromuscular re-education;Patient/family education;Manual techniques;Scar mobilization;Passive range of motion;Dry  needling;Taping;Spinal Manipulations;Joint Manipulations;Biofeedback;Ultrasound;Traction;Orthotic Fit/Training    PT Next Visit Plan  DN vs. TP release surrounding the LB and hip-flexors, L up-slip correction, posture education, give heel-lift, HEP for lower crossed    PT Home Exercise Plan  --    Consulted and Agree with Plan of Care  Patient       Patient will benefit from skilled therapeutic intervention in order to improve the following deficits and impairments:  Decreased endurance, Decreased range of motion, Decreased strength, Hypomobility, Increased fascial restricitons, Increased muscle spasms, Impaired flexibility, Improper body mechanics, Postural dysfunction, Obesity, Pain, Abnormal gait, Decreased coordination, Decreased activity tolerance  Visit Diagnosis: 1. Abnormal posture   2. Other muscle spasm   3. Chronic bilateral low back pain with sciatica, sciatica laterality unspecified        Problem List Patient Active Problem List   Diagnosis Date Noted  . Nausea 08/30/2018  . RUQ pain 08/30/2018  . Pelvic pain 08/30/2018  . Pain with urination 08/30/2018  . 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 Family history of breast cancer   . Family history of melanoma   . Urge incontinence 03/23/2016  . Diarrhea 12/16/2015  . Change in bowel habits 12/16/2015  . Generalized abdominal pain 12/16/2015  . Rectal bleeding 12/16/2015  . Pulmonary edema 08/21/2015  . Anxiety 03/17/2015  . IC (interstitial cystitis) 01/22/2015  . Vaginal itching 01/12/2015  . Hemorrhoids 01/12/2015  . History of rectal bleeding 01/12/2015  . Vestibulitis, vulvar 04/21/2014  . BV (bacterial vaginosis) 04/10/2014  . Vaginal discharge 03/13/2014  . Yeast infection 03/13/2014  . Encounter for menstrual regulation 03/13/2014   Willa Rough DPT, ATC Willa Rough 09/05/2018, 9:32 AM  [place] MAIN [facility] SERVICES [address] [place], [place], [address] Phone: [phone]   Fax:  [phone]  Name: Karen Hoover MRN: [id number] Date of Birth: 05-07-1990

## 2018-09-03 NOTE — Patient Instructions (Signed)
    Relax all the way forward, tucking your hips under just slightly so you feel a stretch through your low back. Hold for 5 deep breaths, repeat 2-3 times, 1-2 times per day.   Do 2 sets of 15 tilts per day. Breathe in when you tilt forward (A) and out when you tuck under (B).   

## 2018-09-04 ENCOUNTER — Other Ambulatory Visit: Payer: Self-pay

## 2018-09-04 ENCOUNTER — Ambulatory Visit (HOSPITAL_COMMUNITY)
Admission: RE | Admit: 2018-09-04 | Discharge: 2018-09-04 | Disposition: A | Discharge status: Home / Self Care | Payer: 59 | Source: Ambulatory Visit | Attending: Adult Health | Admitting: Adult Health

## 2018-09-04 DIAGNOSIS — R102 Pelvic and perineal pain: Secondary | ICD-10-CM | POA: Insufficient documentation

## 2018-09-04 DIAGNOSIS — K819 Cholecystitis, unspecified: Secondary | ICD-10-CM | POA: Diagnosis not present

## 2018-09-04 DIAGNOSIS — R1011 Right upper quadrant pain: Secondary | ICD-10-CM | POA: Diagnosis not present

## 2018-09-04 DIAGNOSIS — R110 Nausea: Secondary | ICD-10-CM

## 2018-09-05 ENCOUNTER — Encounter: Payer: Self-pay | Admitting: Adult Health

## 2018-09-05 ENCOUNTER — Telehealth: Payer: Self-pay | Admitting: Adult Health

## 2018-09-05 ENCOUNTER — Ambulatory Visit (HOSPITAL_COMMUNITY): Payer: 59

## 2018-09-05 DIAGNOSIS — K760 Fatty (change of) liver, not elsewhere classified: Secondary | ICD-10-CM

## 2018-09-05 DIAGNOSIS — K8020 Calculus of gallbladder without cholecystitis without obstruction: Secondary | ICD-10-CM

## 2018-09-05 HISTORY — DX: Calculus of gallbladder without cholecystitis without obstruction: K80.20

## 2018-09-05 HISTORY — DX: Fatty (change of) liver, not elsewhere classified: K76.0

## 2018-09-05 NOTE — Telephone Encounter (Signed)
Pt aware pelvic US was normal, but abdomen US showed gallstones and ?cholecystis and  Fatty liver, will refer to Dr Constance Haw and do not eat greasy, fatty spicy foods and if increased pain go to ER

## 2018-09-12 MED FILL — BLISOVI 24 FE 1-20 MG-MCG(2: 1-20 | 84 days supply | Qty: 84 | Fill #0

## 2018-09-17 ENCOUNTER — Ambulatory Visit: Payer: 59

## 2018-09-18 ENCOUNTER — Ambulatory Visit: Payer: 59 | Attending: Neurosurgery

## 2018-09-18 ENCOUNTER — Encounter: Payer: Self-pay | Admitting: General Surgery

## 2018-09-18 ENCOUNTER — Other Ambulatory Visit: Payer: Self-pay

## 2018-09-18 ENCOUNTER — Ambulatory Visit (INDEPENDENT_AMBULATORY_CARE_PROVIDER_SITE_OTHER): Payer: 59 | Admitting: General Surgery

## 2018-09-18 VITALS — BP 129/84 | HR 76 | Temp 97.3°F | Resp 16 | Ht 65.0 in | Wt 264.0 lb

## 2018-09-18 DIAGNOSIS — M62838 Other muscle spasm: Secondary | ICD-10-CM | POA: Diagnosis not present

## 2018-09-18 DIAGNOSIS — M5440 Lumbago with sciatica, unspecified side: Secondary | ICD-10-CM | POA: Diagnosis not present

## 2018-09-18 DIAGNOSIS — G8929 Other chronic pain: Secondary | ICD-10-CM | POA: Insufficient documentation

## 2018-09-18 DIAGNOSIS — K8020 Calculus of gallbladder without cholecystitis without obstruction: Secondary | ICD-10-CM | POA: Diagnosis not present

## 2018-09-18 DIAGNOSIS — R293 Abnormal posture: Secondary | ICD-10-CM | POA: Insufficient documentation

## 2018-09-18 NOTE — Therapy (Signed)
[place] MAIN [facility] SERVICES [address] [place], [place], [address] Phone: [phone]   Fax:  [phone]  Physical Therapy Treatment  The patient has been informed of current processes in place at Outpatient Rehab to protect patients from Covid-19 exposure including social distancing, schedule modifications, and new cleaning procedures. After discussing their particular risk with a therapist based on the patient's personal risk factors, the patient has decided to proceed with in-person therapy.   Patient Details  Name: Karen Hoover MRN: [id number] Date of Birth: 04/02/1990 Referring Provider (PT): Erline Levine   Encounter Date: 09/18/2018  PT End of Session - 09/18/18 0936    Visit Number  2    Number of Visits  10    Date for PT Re-Evaluation  11/14/18    Authorization Type  [facility]    Authorization Time Period  through 11/14/2018    Authorization - Visit Number  2    Authorization - Number of Visits  10   clinical eligibility required after 25th visit   PT Start Time  0816    PT Stop Time  0916    PT Time Calculation (min)  60 min    Activity Tolerance  Patient tolerated treatment well    Behavior During Therapy  [facility] for tasks assessed/performed       Past Medical History:  Diagnosis Date  . Anemia   . Anxiety 03/17/2015  . BV (bacterial vaginosis) 04/10/2014  . Chronic lower back pain    2 herniated disc sees Dr Carloyn Manner  . Encounter for menstrual regulation 03/13/2014  . Family history of breast cancer   . Family history of melanoma   . Fatty liver 09/05/2018  . Gallstones 09/05/2018   Refer to Dr Constance Haw  . Hemorrhoids 01/12/2015  . History of rectal bleeding 01/12/2015  . Hypothyroidism   . IC (interstitial cystitis) 01/22/2015  . Pulmonary edema 08/21/2015   S/P back OR  . Thyroid disease   . Vaginal discharge 03/13/2014  . Vaginal itching 01/12/2015  . Vestibulitis, vulvar 04/21/2014  . Yeast infection 03/13/2014     Past Surgical History:  Procedure Laterality Date  . BACK SURGERY    . LAMINECTOMY AND MICRODISCECTOMY LUMBAR SPINE Right 08/21/2015   L4-5 and L5-S 1    There were no vitals filed for this visit.    Pelvic Floor Physical Therapy Treatment Note  SCREENING  Changes in medications, allergies, or medical history?: has gallstones, gallbladder removal is going to be determined today.    SUBJECTIVE  Patient reports: She feels like the exercises she was given made everything hurt more so she has not done them much. Only has no pain when in bed relaxing.  Precautions:  none  Pain update:  Location of pain: RUQ (galbladder), occasionally in pelvis Current pain:  3/10  Max pain:  7/10 Least pain:  0/10 Nature of pain: sharp constant pain, strong grab/sharp in pelvis  *Following treatment, mild irritation of R hip due to focus on alignment on L.  Patient Goals: Decrease leakage, not have pain with intercourse or at rest.   OBJECTIVE  Changes in: Posture/Observations:  Hyperlordosis, L up-slip  Range of Motion/Flexibilty:  Decreased sacral mobility B  Abdominal:  Difficulty coordinating pelvic tilt in standing> sitting  Palpation: TTP to L QL and Psoas  INTERVENTIONS THIS SESSION: Manual: Performed TP release  to L QL and Psoas to decrease spasm and pain and allow for improved balance of musculature for improved  function and decreased symptoms. Performed grade 3-4 PA mobs to B sacral borders to improve mobility of joint and surrounding connective tissue and decrease pressure on nerve roots for improved conductivity and function of down-stream tissues.  Therex: Reviewed and practiced seated pelvic tilts, educated on and practiced standing pelvic tilts and hip-flexor stretch to facilitate improved ROM and posture to decrease pain.  Total time: 60 min.                           PT Short Term Goals - 09/05/18 0920      PT SHORT TERM GOAL #1    Title  Patient will demonstrate improved pelvic alignment and balance of musculature surrounding the pelvis to facilitate decreased PFM spasms and decrease pelvic pain.    Baseline  LLE long by 3 cm, spasms surrounding    Time  5    Period  Weeks    Status  New    Target Date  10/10/18      PT SHORT TERM GOAL #2   Title  Patient will report a reduction in pain to no greater than 5/10 over the prior week to demonstrate symptom improvement.    Baseline  Max pain of 10/10    Time  5    Period  Weeks    Status  New    Target Date  10/10/18      PT SHORT TERM GOAL #3   Title  Patient will demonstrate HEP x1 in the clinic to demonstrate understanding and proper form to allow for further improvement.    Baseline  Pt. lacks knowledge of the therepeutic exercises that will help her decrease her pain and incontinence.    Time  5    Period  Weeks    Status  New    Target Date  10/10/18        PT Long Term Goals - 09/05/18 0923      PT LONG TERM GOAL #1   Title  Patient will report no episodes of SUI/UUI over the course of the prior two weeks to demonstrate improved functional ability.    Baseline  Having leakage all day, wearing 1 pantyliner per day    Time  10    Period  Weeks    Status  New    Target Date  11/14/18      PT LONG TERM GOAL #2   Title  Patient will report no pain with intercourse to demonstrate improved functional ability.    Baseline  Pt. having pain with all intercourse    Time  10    Period  Weeks    Status  New    Target Date  11/14/18      PT LONG TERM GOAL #3   Title  Patient will score at or below 60/300 on the PFDI and 21/300 on the PFIQ to demonstrate a clinically meaningful decrease in disability and distress due to pelvic floor dysfunction.    Baseline  PFDI: 119/300, PFIQ: 42/300    Time  10    Period  Weeks    Status  New    Target Date  11/14/18      PT LONG TERM GOAL #4   Title  Patient will score less than or equal to 20% on the Female  NIH-CPSI to demonstrate a reduction in pain, urinary symptoms, and an improved quality of life.    Baseline  Female NIH-CPSI: 23/43 (53%)  Time  10    Period  Weeks    Status  New    Target Date  11/14/18      PT LONG TERM GOAL #5   Title  Patient will describe pain no greater than 2/10 during work, exercise, and ADL's to demonstrate improved functional ability.    Baseline  10/10 max pain, 2/10 at best    Time  10    Period  Weeks    Status  New    Target Date  11/14/18            Plan - 09/18/18 2409    Clinical Impression Statement  Pt. Responded well to all interventions today, demonstrating improved pelvic alignment, increased sacral mobility, and decreased spasms as well as understanding and correct performance of all education and exercises provided today. They will continue to benefit from skilled physical therapy to work toward remaining goals and maximize function as well as decrease likelihood of symptom increase or recurrence.     Personal Factors and Comorbidities  Comorbidity 3+;Fitness    Comorbidities  Anxiety, Depression, Diabetes, Prior laminectomy at L4-5-S1    Examination-Activity Limitations  Lift;Squat;Stand;Continence;Sit    Examination-Participation Restrictions  Interpersonal Relationship    Stability/Clinical Decision Making  Unstable/Unpredictable    Rehab Potential  Good    PT Frequency  1x / week    PT Duration  Other (comment)   10 weeks   PT Treatment/Interventions  ADLs/Self Care Home Management;Electrical Stimulation;Moist Heat;Functional mobility training;Therapeutic activities;Therapeutic exercise;Neuromuscular re-education;Patient/family education;Manual techniques;Scar mobilization;Passive range of motion;Dry needling;Taping;Spinal Manipulations;Joint Manipulations;Biofeedback;Ultrasound;Traction;Orthotic Fit/Training    PT Next Visit Plan  DN vs. TP release surrounding the LB and hip-flexors (R>L), review/expand posture education, HEP for  lower crossed    PT Home Exercise Plan  posterior pelvic tilts, hip-flexor stretch.    Consulted and Agree with Plan of Care  Patient       Patient will benefit from skilled therapeutic intervention in order to improve the following deficits and impairments:  Decreased endurance, Decreased range of motion, Decreased strength, Hypomobility, Increased fascial restricitons, Increased muscle spasms, Impaired flexibility, Improper body mechanics, Postural dysfunction, Obesity, Pain, Abnormal gait, Decreased coordination, Decreased activity tolerance  Visit Diagnosis: 1. Abnormal posture   2. Other muscle spasm   3. Chronic bilateral low back pain with sciatica, sciatica laterality unspecified        Problem List Patient Active Problem List   Diagnosis Date Noted  . Gallstones 09/05/2018  . Fatty liver 09/05/2018  . Nausea 08/30/2018  . RUQ pain 08/30/2018  . Pelvic pain 08/30/2018  . Pain with urination 08/30/2018  . 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 Family history of breast cancer   . Family history of melanoma   . Urge incontinence 03/23/2016  . Diarrhea 12/16/2015  . Change in bowel habits 12/16/2015  . Generalized abdominal pain 12/16/2015  . Rectal bleeding 12/16/2015  . Pulmonary edema 08/21/2015  . Anxiety 03/17/2015  . IC (interstitial cystitis) 01/22/2015  . Vaginal itching 01/12/2015  . Hemorrhoids 01/12/2015  . History of rectal bleeding 01/12/2015  . Vestibulitis, vulvar 04/21/2014  . BV (bacterial vaginosis) 04/10/2014  . Vaginal discharge 03/13/2014  . Yeast infection 03/13/2014  . Encounter for menstrual regulation 03/13/2014   Willa Rough DPT, ATC Willa Rough 09/18/2018, 9:39 AM  [place] MAIN [facility] SERVICES [address] [place], [place], [address] Phone: [phone]   Fax:  [phone]  Name: Janyth Riera  MRN: [id number] Date of Birth: October 10, 1990

## 2018-09-18 NOTE — Patient Instructions (Signed)
As you start wearing your heel-lift only wear it for an hour the first day and increase by an hour each day so you can allow for the body to adapt to the change easily without much pain. If your pain increases by more than 1-2 points, back off slightly or slow down how quickly you increase your wear time. Once you reach a full day of wear, use it as much as possible forever, even in house-shoes or flip-flops if necessary to keep yourself from reverting to bad pelvic and spinal alignment and having symptoms return.    Adjust-a-lift heel lift Can be found at [email or web address]   Flexors, Lunge  Hip Flexor Stretch: Proposal Pose    Maintain pelvic tuck under, lift pubic bone toward navel. Engage posterior hip muscles (firm glute muscles of leg in back position) and shift forward until you feel stretch on front of leg that is down. To increase stretch, maintain balance and ease hips forward. You may use one hand on a chair for balance if needed. Hold for __5__ breaths. Repeat __2-3__ times each leg.  Do _1-2__ times per day.   

## 2018-09-18 NOTE — Progress Notes (Signed)
I was present with the medical student for this service. I personally verified the history of present illness, performed the physical exam, and made the plan for this encounter. I have verified the medical student's documentation and made modifications where appropriately. I have personally documented in my own words a brief history, physical, and plan below.     Patient with symptoms of biliary colic with pain and nausea with greasy foods. Also with other symptoms that are unlikely to improve or change like diarrhea and could get worse after surgery. This was explained to the patient.   Will remove her gallbladder in the upcoming weeks. Curlene Labrum, MD [facility] [place], [place] [id number] [phone] (office)    [facility] History and Physical  Reason for Referral: Gallstones  Referring Physician: Dr. Ronne Binning   Chief Complaint    Pre-op Exam      Karen Hoover is a 28 y.o. female.  HPI: Karen Hoover is a 89 y o F with obesity, type 2 diabetes, hypothyroidism, sleep apnea, and a history of laminectomy and microdisectomy of the lumbar spine in 2017 here for gallstones. The patient has had crampy RUQ abdominal pain since the middle of July that she described as getting worse when she was eating, especially fatty greasy foods. The patient had an ultrasound on 7/28 that showed gallstones in a contracted gallbladder with gallbladder wall thickness 6.6 mm and no biliary distention.  She describes the pain as mostly "crampy and sharp". She has had some nausea but no vomiting. She sometimes puts a heating pad and takes liquid aleve and she says this makes it better. She takes tramadol and a muscle relaxant prescribed for her back pain when the pain gets really bad. She states that the pain has awoken her from sleep before and has become more persistent in nature, happening not only when she is eating but also  when she is working or sleeping.   She says the worse the pain has gotten is 10/10. She says the pain sometimes radiates down diffusely to her abdomen. She has had no fevers or chills. No chest pain, no SOB.   The patient also has had diarrhea for the last two years. She had a colonoscopy in 2017 for workup of diarrhea and hematochezia that was unremarkable. She states she still has diarrhea chronically, almost every time she eats. She endorses dark colored stools.  She endorses urinary frequency and incontinence. She states that it does not hurt when she is peeing. She says that this has been a chronic problem and she has had it since her disectomy surgery in 2017 and she has been working with PT for this problem.   The patient has a known CHEK2 mutation and it is recommended that she get colonoscopies every 5-10 years. She has a history of breast cancer in mom, melanoma in grandparents, and colon cancer in the family.       Past Medical History:  Diagnosis Date  . Anemia   . Anxiety 03/17/2015  . BV (bacterial vaginosis) 04/10/2014  . Chronic lower back pain    2 herniated disc sees Dr Carloyn Manner  . Encounter for menstrual regulation 03/13/2014  . Family history of breast cancer   . Family history of melanoma   . Fatty liver 09/05/2018  . Gallstones 09/05/2018   Refer to Dr Constance Haw  . Hemorrhoids 01/12/2015  . History of rectal bleeding 01/12/2015  . Hypothyroidism   . IC (  interstitial cystitis) 01/22/2015  . Pulmonary edema 08/21/2015   S/P back OR  . Thyroid disease   . Vaginal discharge 03/13/2014  . Vaginal itching 01/12/2015  . Vestibulitis, vulvar 04/21/2014  . Yeast infection 03/13/2014    Past Surgical History:  Procedure Laterality Date  . BACK SURGERY    . LAMINECTOMY AND MICRODISCECTOMY LUMBAR SPINE Right 08/21/2015   L4-5 and L5-S 1    Family History  Problem Relation Age of Onset  . Diabetes Mother   . Thyroid disease Mother   . Breast cancer Mother 30       CHEK2 pos  .  Thyroid disease Brother   . Diabetes Maternal Grandmother   . Melanoma Maternal Grandmother        d. 34  . Cancer Paternal Grandmother   . Melanoma Maternal Grandfather        d. 64  . Kidney cancer Other        MGF's sister  . Breast cancer Cousin 32       mother's pat first cousin    Social History   Tobacco Use  . Smoking status: Never Smoker  . Smokeless tobacco: Never Used  Substance Use Topics  . Alcohol use: Yes    Comment: occasionally   . Drug use: No    Medications: Personally reviewed all medications.  Allergies as of 09/18/2018   No Known Allergies     Medication List       Accurate as of September 18, 2018 11:59 PM. If you have any questions, ask your nurse or doctor.        acetaminophen 500 MG tablet Commonly known as: TYLENOL Take 1,000 mg by mouth every 6 (six) hours as needed.   aspirin EC 81 MG tablet Take 81 mg by mouth daily.   Blisovi 24 Fe 1-20 MG-MCG(24) tablet Generic drug: Norethindrone Acetate-Ethinyl Estrad-FE TAKE 1 TABLET BY MOUTH ONCE DAILY   escitalopram 20 MG tablet Commonly known as: LEXAPRO TAKE 1 TABLET BY MOUTH ONCE DAILY   fluticasone 50 MCG/ACT nasal spray Commonly known as: FLONASE Place 2 sprays into both nostrils as needed.   HYDROcodone-acetaminophen 5-325 MG tablet Commonly known as: NORCO/VICODIN Take 1 tablet by mouth every 6 (six) hours as needed for moderate pain.   ibuprofen 200 MG tablet Commonly known as: ADVIL Take 800 mg by mouth as needed.   levothyroxine 50 MCG tablet Commonly known as: SYNTHROID Take 50 mcg by mouth daily.   metFORMIN 500 MG tablet Commonly known as: GLUCOPHAGE Take 1,000 mg by mouth 2 (two) times daily with a meal.   methocarbamol 500 MG tablet Commonly known as: ROBAXIN Take 1 tablet (500 mg total) by mouth 3 (three) times daily between meals as needed.   traMADol 50 MG tablet Commonly known as: ULTRAM Take 1 tablet (50 mg total) by mouth every 6 (six) hours as needed.        ROS:  A comprehensive review of systems was negative except for: Gastrointestinal: positive for abdominal pain and diarrhea Genitourinary: positive for dysuria and frequency Musculoskeletal: positive for back pain and joint pain Neurological: positive for back and legs have some numbness Endocrine: positive for temperature intolerance and fatigue   Blood pressure 129/84, pulse 76, temperature (!) 97.3 F (36.3 C), temperature source Tympanic, resp. rate 16, height '5\' 5"'$  (1.651 m), weight 264 lb (119.7 kg), last menstrual period 08/22/2018, SpO2 98 %.  Physical Exam  Constitutional: She is oriented to person, place, and time and  well-developed, well-nourished, and in no distress.  HENT:  Head: Normocephalic and atraumatic.  Eyes: Conjunctivae are normal.  Neck: Normal range of motion.  Cardiovascular: Normal rate, regular rhythm and normal heart sounds.  Pulmonary/Chest: Effort normal and breath sounds normal. No respiratory distress.  Abdominal: Soft. Bowel sounds are normal. There is abdominal tenderness in the right upper quadrant. There is no rigidity, no rebound, no guarding and negative Murphy's sign.  Musculoskeletal: Normal range of motion.        General: No edema.  Neurological: She is alert and oriented to person, place, and time.  Skin: Skin is warm and dry.  Psychiatric: Mood, memory, affect and judgment normal.    Results: U/S abdomen on 09/04/18  EXAM: ABDOMEN ULTRASOUND COMPLETE  COMPARISON:  Ultrasound pelvis 09/04/2018.  MRI lumbar spine 29.  FINDINGS: Gallbladder: Dense echoes noted the region of the gallbladder suggesting gallstones in a contracted gallbladder. Gallbladder wall thickness 6.6 mm. Cholecystitis cannot be excluded. Negative Murphy sign.  Common bile duct: Diameter: 4.2 mm  Liver: Liver is difficult to evaluate due to increased echogenicity which is most likely secondary to fatty infiltration or hepatocellular disease. 2  hypoechoic foci are noted in the liver, these are mostly likely benign cysts and the largest measures 2.5 cm. Portal vein is patent on color Doppler imaging with normal direction of blood flow towards the liver.  IVC: No abnormality visualized.  Pancreas: Visualized portion unremarkable.  Spleen: Size and appearance within normal limits.  Right Kidney: Length: 11.5 cm. Echogenicity within normal limits. No mass or hydronephrosis visualized.  Left Kidney: Length: 12.7 cm. Echogenicity within normal limits. No mass or hydronephrosis visualized.  Abdominal aorta: No aneurysm visualized.  Other findings: None.  IMPRESSION: 1. Dense echoes in the region of the gallbladder suggesting gallstones in a contracted gallbladder. Gallbladder wall thickness 6.6 mm. Cholecystitis cannot be excluded. No biliary distention.  2. Liver difficult to evaluate due to increased echogenicity which is most likely secondary to fatty infiltration or hepatocellular disease. Two small hypoechoic foci in the liver most likely benign cysts. Largest measures 2.5 cm.  Assessment & Plan:   Karen Hoover is a 71 y o F with obesity, type 2 diabetes, hypothyroidism, sleep apnea, and a history of laminectomy and microdisectomy of the lumbar spine in 2017 here for gallstones. She has had crampy abdominal pain that has progressively gotten worse the last few weeks and gets worse with greasy foods and is intermittent in nature. U/S shows gallstones and she has many risk factors that predispose her to gallstones such as her obesity, sex, and age. Biliary colic is the likely etiology however chronic cholecystitis cannot be ruled out, especially with thickening of gallbladder wall to 6.6 mm.   - Laparoscopic cholecystectomy scheduled - COVID testing and isolation explained to the patient   PLAN: I counseled the patient about the indication, risks and benefits of laparoscopic cholecystectomy.  She understands  there is a very small chance for bleeding, infection, injury to normal structures (including common bile duct), conversion to open surgery, persistent symptoms, evolution of postcholecystectomy diarrhea, need for secondary interventions, anesthesia reaction, cardiopulmonary issues and other risks not specifically detailed here. I described the expected recovery, the plan for follow-up and the restrictions during the recovery phase.  All questions were answered.   All questions were answered to the satisfaction of the patient.    Virl Cagey 09/24/2018, 12:54 PM

## 2018-09-18 NOTE — Patient Instructions (Signed)
Laparoscopic Cholecystectomy Laparoscopic cholecystectomy is surgery to remove the gallbladder. The gallbladder is a pear-shaped organ that lies beneath the liver on the right side of the body. The gallbladder stores bile, which is a fluid that helps the body to digest fats. Cholecystectomy is often done for inflammation of the gallbladder (cholecystitis). This condition is usually caused by a buildup of gallstones (cholelithiasis) in the gallbladder. Gallstones can block the flow of bile, which can result in inflammation and pain. In severe cases, emergency surgery may be required. This procedure is done though small incisions in your abdomen (laparoscopic surgery). A thin scope with a camera (laparoscope) is inserted through one incision. Thin surgical instruments are inserted through the other incisions. In some cases, a laparoscopic procedure may be turned into a type of surgery that is done through a larger incision (open surgery). Tell a health care provider about:  Any allergies you have.  All medicines you are taking, including vitamins, herbs, eye drops, creams, and over-the-counter medicines.  Any problems you or family members have had with anesthetic medicines.  Any blood disorders you have.  Any surgeries you have had.  Any medical conditions you have.  Whether you are pregnant or may be pregnant. What are the risks? Generally, this is a safe procedure. However, problems may occur, including:  Infection.  Bleeding.  Allergic reactions to medicines.  Damage to other structures or organs.  A stone remaining in the common bile duct. The common bile duct carries bile from the gallbladder into the small intestine.  A bile leak from the cyst duct that is clipped when your gallbladder is removed. Medicines  Ask your health care provider about: ? Changing or stopping your regular medicines. This is especially important if you are taking diabetes medicines or blood thinners. ?  Taking medicines such as aspirin and ibuprofen. These medicines can thin your blood. Do not take these medicines before your procedure if your health care provider instructs you not to.  You may be given antibiotic medicine to help prevent infection. General instructions  Let your health care provider know if you develop a cold or an infection before surgery.  Plan to have someone take you home from the hospital or clinic.  Ask your health care provider how your surgical site will be marked or identified. What happens during the procedure?   To reduce your risk of infection: ? Your health care team will wash or sanitize their hands. ? Your skin will be washed with soap. ? Hair may be removed from the surgical area.  An IV tube may be inserted into one of your veins.  You will be given one or more of the following: ? A medicine to help you relax (sedative). ? A medicine to make you fall asleep (general anesthetic).  A breathing tube will be placed in your mouth.  Your surgeon will make several small cuts (incisions) in your abdomen.  The laparoscope will be inserted through one of the small incisions. The camera on the laparoscope will send images to a TV screen (monitor) in the operating room. This lets your surgeon see inside your abdomen.  Air-like gas will be pumped into your abdomen. This will expand your abdomen to give the surgeon more room to perform the surgery.  Other tools that are needed for the procedure will be inserted through the other incisions. The gallbladder will be removed through one of the incisions.  Your common bile duct may be examined. If stones are found   in the common bile duct, they may be removed.  After your gallbladder has been removed, the incisions will be closed with stitches (sutures), staples, or skin glue.  Your incisions may be covered with a bandage (dressing). The procedure may vary among health care providers and hospitals. What happens  after the procedure?  Your blood pressure, heart rate, breathing rate, and blood oxygen level will be monitored until the medicines you were given have worn off.  You will be given medicines as needed to control your pain.  Do not drive for 24 hours if you were given a sedative. This information is not intended to replace advice given to you by your health care provider. Make sure you discuss any questions you have with your health care provider. Document Released: 01/24/2005 Document Revised: 01/06/2017 Document Reviewed: 07/13/2015 Elsevier Patient Education  2020 [employer] Cholelithiasis  Cholelithiasis is also called "gallstones." It is a kind of gallbladder disease. The gallbladder is an organ that stores a liquid (bile) that helps you digest fat. Gallstones may not cause symptoms (may be silent gallstones) until they cause a blockage, and then they can cause pain (gallbladder attack). Follow these instructions at home:  Take over-the-counter and prescription medicines only as told by your doctor.  Stay at a healthy weight.  Eat healthy foods. This includes: ? Eating fewer fatty foods, like fried foods. ? Eating fewer refined carbs (refined carbohydrates). Refined carbs are breads and grains that are highly processed, like white bread and white rice. Instead, choose whole grains like whole-wheat bread and brown rice. ? Eating more fiber. Almonds, fresh fruit, and beans are healthy sources of fiber.  Keep all follow-up visits as told by your doctor. This is important. Contact a doctor if:  You have sudden pain in the upper right side of your belly (abdomen). Pain might spread to your right shoulder or your chest. This may be a sign of a gallbladder attack.  You feel sick to your stomach (are nauseous).  You throw up (vomit).  You have been diagnosed with gallstones that have no symptoms and you get: ? Belly pain. ? Discomfort, burning, or fullness in the upper part of your  belly (indigestion). Get help right away if:  You have sudden pain in the upper right side of your belly, and it lasts for more than 2 hours.  You have belly pain that lasts for more than 5 hours.  You have a fever or chills.  You keep feeling sick to your stomach or you keep throwing up.  Your skin or the whites of your eyes turn yellow (jaundice).  You have dark-colored pee (urine).  You have light-colored poop (stool). Summary  Cholelithiasis is also called "gallstones."  The gallbladder is an organ that stores a liquid (bile) that helps you digest fat.  Silent gallstones are gallstones that do not cause symptoms.  A gallbladder attack may cause sudden pain in the upper right side of your belly. Pain might spread to your right shoulder or your chest. If this happens, contact your doctor.  If you have sudden pain in the upper right side of your belly that lasts for more than 2 hours, get help right away. This information is not intended to replace advice given to you by your health care provider. Make sure you discuss any questions you have with your health care provider. Document Released: 07/13/2007 Document Revised: 01/06/2017 Document Reviewed: 10/11/2015 Elsevier Patient Education  2020 [employer]  

## 2018-09-24 MED FILL — LEVOTHYROXINE 75 MCG TABLET: 75 | 30 days supply | Qty: 30 | Fill #2

## 2018-09-24 MED FILL — ESCITALOPRAM 20 MG TABLET: 20 | 30 days supply | Qty: 30 | Fill #2

## 2018-09-24 NOTE — H&P (Signed)
I was present with the medical student for this service. I personally verified the history of present illness, performed the physical exam, and made the plan for this encounter. I have verified the medical student's documentation and made modifications where appropriately. I have personally documented in my own words a brief history, physical, and plan below.     Patient with symptoms of biliary colic with pain and nausea with greasy foods. Also with other symptoms that are unlikely to improve or change like diarrhea and could get worse after surgery. This was explained to the patient.   Will remove her gallbladder in the upcoming weeks. Curlene Labrum, MD [facility] [place], [place] [address] [phone] (office)    [facility] History and Physical  Reason for Referral: Gallstones  Referring Physician: Dr. Ronne Binning      Chief Complaint    Pre-op Exam      Karen Hoover is a 28 y.o. female.  HPI: Karen Hoover is a 21 y o F with obesity, type 2 diabetes, hypothyroidism, sleep apnea, and a history of laminectomy and microdisectomy of the lumbar spine in 2017 here for gallstones. The patient has had crampy RUQ abdominal pain since the middle of July that she described as getting worse when she was eating, especially fatty greasy foods. The patient had an ultrasound on 7/28 that showed gallstones in a contracted gallbladder with gallbladder wall thickness 6.6 mm and no biliary distention.  She describes the pain as mostly "crampy and sharp". She has had some nausea but no vomiting. She sometimes puts a heating pad and takes liquid aleve and she says this makes it better. She takes tramadol and a muscle relaxant prescribed for her back pain when the pain gets really bad. She states that the pain has awoken her from sleep before and has become more persistent in nature, happening not only when she is  eating but also when she is working or sleeping.   She says the worse the pain has gotten is 10/10. She says the pain sometimes radiates down diffusely to her abdomen. She has had no fevers or chills. No chest pain, no SOB.   The patient also has had diarrhea for the last two years. She had a colonoscopy in 2017 for workup of diarrhea and hematochezia that was unremarkable. She states she still has diarrhea chronically, almost every time she eats. She endorses dark colored stools.  She endorses urinary frequency and incontinence. She states that it does not hurt when she is peeing. She says that this has been a chronic problem and she has had it since her disectomy surgery in 2017 and she has been working with PT for this problem.   The patient has a known CHEK2 mutation and it is recommended that she get colonoscopies every 5-10 years. She has a history of breast cancer in mom, melanoma in grandparents, and colon cancer in the family.           Past Medical History:  Diagnosis Date  . Anemia   . Anxiety 03/17/2015  . BV (bacterial vaginosis) 04/10/2014  . Chronic lower back pain    2 herniated disc sees Dr Carloyn Manner  . Encounter for menstrual regulation 03/13/2014  . Family history of breast cancer   . Family history of melanoma   . Fatty liver 09/05/2018  . Gallstones 09/05/2018   Refer to Dr Constance Haw  . Hemorrhoids 01/12/2015  . History of rectal bleeding 01/12/2015  .  Hypothyroidism   . IC (interstitial cystitis) 01/22/2015  . Pulmonary edema 08/21/2015   S/P back OR  . Thyroid disease   . Vaginal discharge 03/13/2014  . Vaginal itching 01/12/2015  . Vestibulitis, vulvar 04/21/2014  . Yeast infection 03/13/2014         Past Surgical History:  Procedure Laterality Date  . BACK SURGERY    . LAMINECTOMY AND MICRODISCECTOMY LUMBAR SPINE Right 08/21/2015   L4-5 and L5-S 1         Family History  Problem Relation Age of Onset  . Diabetes Mother   . Thyroid disease Mother    . Breast cancer Mother 70       CHEK2 pos  . Thyroid disease Brother   . Diabetes Maternal Grandmother   . Melanoma Maternal Grandmother        d. 98  . Cancer Paternal Grandmother   . Melanoma Maternal Grandfather        d. 3  . Kidney cancer Other        MGF's sister  . Breast cancer Cousin 63       mother's pat first cousin    Social History        Tobacco Use  . Smoking status: Never Smoker  . Smokeless tobacco: Never Used  Substance Use Topics  . Alcohol use: Yes    Comment: occasionally   . Drug use: No    Medications: Personally reviewed all medications.  Allergies as of 09/18/2018   No Known Allergies        Medication List       Accurate as of September 18, 2018 11:59 PM. If you have any questions, ask your nurse or doctor.        acetaminophen 500 MG tablet Commonly known as: TYLENOL Take 1,000 mg by mouth every 6 (six) hours as needed.   aspirin EC 81 MG tablet Take 81 mg by mouth daily.   Blisovi 24 Fe 1-20 MG-MCG(24) tablet Generic drug: Norethindrone Acetate-Ethinyl Estrad-FE TAKE 1 TABLET BY MOUTH ONCE DAILY   escitalopram 20 MG tablet Commonly known as: LEXAPRO TAKE 1 TABLET BY MOUTH ONCE DAILY   fluticasone 50 MCG/ACT nasal spray Commonly known as: FLONASE Place 2 sprays into both nostrils as needed.   HYDROcodone-acetaminophen 5-325 MG tablet Commonly known as: NORCO/VICODIN Take 1 tablet by mouth every 6 (six) hours as needed for moderate pain.   ibuprofen 200 MG tablet Commonly known as: ADVIL Take 800 mg by mouth as needed.   levothyroxine 50 MCG tablet Commonly known as: SYNTHROID Take 50 mcg by mouth daily.   metFORMIN 500 MG tablet Commonly known as: GLUCOPHAGE Take 1,000 mg by mouth 2 (two) times daily with a meal.   methocarbamol 500 MG tablet Commonly known as: ROBAXIN Take 1 tablet (500 mg total) by mouth 3 (three) times daily between meals as needed.   traMADol 50 MG tablet  Commonly known as: ULTRAM Take 1 tablet (50 mg total) by mouth every 6 (six) hours as needed.       ROS:  A comprehensive review of systems was negative except for: Gastrointestinal: positive for abdominal pain and diarrhea Genitourinary: positive for dysuria and frequency Musculoskeletal: positive for back pain and joint pain Neurological: positive for back and legs have some numbness Endocrine: positive for temperature intolerance and fatigue   Blood pressure 129/84, pulse 76, temperature (!) 97.3 F (36.3 C), temperature source Tympanic, resp. rate 16, height '5\' 5"'$  (1.651 m), weight 264 lb (  119.7 kg), last menstrual period 08/22/2018, SpO2 98 %.  Physical Exam  Constitutional: She is oriented to person, place, and time and well-developed, well-nourished, and in no distress.  HENT:  Head: Normocephalic and atraumatic.  Eyes: Conjunctivae are normal.  Neck: Normal range of motion.  Cardiovascular: Normal rate, regular rhythm and normal heart sounds.  Pulmonary/Chest: Effort normal and breath sounds normal. No respiratory distress.  Abdominal: Soft. Bowel sounds are normal. There is abdominal tenderness in the right upper quadrant. There is no rigidity, no rebound, no guarding and negative Murphy's sign.  Musculoskeletal: Normal range of motion.        General: No edema.  Neurological: She is alert and oriented to person, place, and time.  Skin: Skin is warm and dry.  Psychiatric: Mood, memory, affect and judgment normal.    Results: U/S abdomen on 09/04/18  EXAM: ABDOMEN ULTRASOUND COMPLETE  COMPARISON: Ultrasound pelvis 09/04/2018. MRI lumbar spine 29.  FINDINGS: Gallbladder: Dense echoes noted the region of the gallbladder suggesting gallstones in a contracted gallbladder. Gallbladder wall thickness 6.6 mm. Cholecystitis cannot be excluded. Negative Murphy sign.  Common bile duct: Diameter: 4.2 mm  Liver: Liver is difficult to evaluate due to increased  echogenicity which is most likely secondary to fatty infiltration or hepatocellular disease. 2 hypoechoic foci are noted in the liver, these are mostly likely benign cysts and the largest measures 2.5 cm. Portal vein is patent on color Doppler imaging with normal direction of blood flow towards the liver.  IVC: No abnormality visualized.  Pancreas: Visualized portion unremarkable.  Spleen: Size and appearance within normal limits.  Right Kidney: Length: 11.5 cm. Echogenicity within normal limits. No mass or hydronephrosis visualized.  Left Kidney: Length: 12.7 cm. Echogenicity within normal limits. No mass or hydronephrosis visualized.  Abdominal aorta: No aneurysm visualized.  Other findings: None.  IMPRESSION: 1. Dense echoes in the region of the gallbladder suggesting gallstones in a contracted gallbladder. Gallbladder wall thickness 6.6 mm. Cholecystitis cannot be excluded. No biliary distention.  2. Liver difficult to evaluate due to increased echogenicity which is most likely secondary to fatty infiltration or hepatocellular disease. Two small hypoechoic foci in the liver most likely benign cysts. Largest measures 2.5 cm.  Assessment & Plan:   Ms. Ilamae Geng is a 89 y o F with obesity, type 2 diabetes, hypothyroidism, sleep apnea, and a history of laminectomy and microdisectomy of the lumbar spine in 2017 here for gallstones. She has had crampy abdominal pain that has progressively gotten worse the last few weeks and gets worse with greasy foods and is intermittent in nature. U/S shows gallstones and she has many risk factors that predispose her to gallstones such as her obesity, sex, and age. Biliary colic is the likely etiology however chronic cholecystitis cannot be ruled out, especially with thickening of gallbladder wall to 6.6 mm.   - Laparoscopic cholecystectomy scheduled - COVID testing and isolation explained to the patient   PLAN: I counseled  the patient about the indication, risks and benefits of laparoscopic cholecystectomy.  She understands there is a very small chance for bleeding, infection, injury to normal structures (including common bile duct), conversion to open surgery, persistent symptoms, evolution of postcholecystectomy diarrhea, need for secondary interventions, anesthesia reaction, cardiopulmonary issues and other risks not specifically detailed here. I described the expected recovery, the plan for follow-up and the restrictions during the recovery phase.  All questions were answered.   All questions were answered to the satisfaction of the patient.  Virl Cagey 09/24/2018, 12:54 PM

## 2018-09-28 ENCOUNTER — Ambulatory Visit: Payer: 59

## 2018-09-28 ENCOUNTER — Other Ambulatory Visit: Payer: Self-pay

## 2018-09-28 DIAGNOSIS — M5440 Lumbago with sciatica, unspecified side: Secondary | ICD-10-CM | POA: Diagnosis not present

## 2018-09-28 DIAGNOSIS — G8929 Other chronic pain: Secondary | ICD-10-CM

## 2018-09-28 DIAGNOSIS — M62838 Other muscle spasm: Secondary | ICD-10-CM | POA: Diagnosis not present

## 2018-09-28 DIAGNOSIS — R293 Abnormal posture: Secondary | ICD-10-CM

## 2018-09-28 NOTE — Patient Instructions (Signed)
Cat / Cow Flow    Exhale, press spine toward ceiling like a Halloween cat. Keeping strength in arms and lower abdominals, Inhale to soften spine through neutral and into cow pose. Open chest and sway back. Initiate movement between cat and cow at tailbone, one vertebrae at a time. Repeat __3x10__ times.

## 2018-09-28 NOTE — Therapy (Signed)
[place] MAIN [facility] SERVICES [address] [place], [place], [address] Phone: [phone]   Fax:  [phone]  Physical Therapy Treatment  The patient has been informed of current processes in place at Outpatient Rehab to protect patients from Covid-19 exposure including social distancing, schedule modifications, and new cleaning procedures. After discussing their particular risk with a therapist based on the patient's personal risk factors, the patient has decided to proceed with in-person therapy.   Patient Details  Name: Karen Hoover MRN: [id number] Date of Birth: 1990/07/14 Referring Provider (PT): Erline Levine   Encounter Date: 09/28/2018  PT End of Session - 09/28/18 0901    Visit Number  3    Number of Visits  10    Date for PT Re-Evaluation  11/14/18    Authorization Type  [facility]    Authorization Time Period  through 11/14/2018    Authorization - Visit Number  3    Authorization - Number of Visits  10   clinical eligibility required after 25th visit   PT Start Time  0806    PT Stop Time  0900    PT Time Calculation (min)  54 min    Activity Tolerance  Patient tolerated treatment well;No increased pain    Behavior During Therapy  WFL for tasks assessed/performed       Past Medical History:  Diagnosis Date  . Anemia   . Anxiety 03/17/2015  . BV (bacterial vaginosis) 04/10/2014  . Chronic lower back pain    2 herniated disc sees Dr Carloyn Manner  . Encounter for menstrual regulation 03/13/2014  . Family history of breast cancer   . Family history of melanoma   . Fatty liver 09/05/2018  . Gallstones 09/05/2018   Refer to Dr Constance Haw  . Hemorrhoids 01/12/2015  . History of rectal bleeding 01/12/2015  . Hypothyroidism   . IC (interstitial cystitis) 01/22/2015  . Pulmonary edema 08/21/2015   S/P back OR  . Thyroid disease   . Vaginal discharge 03/13/2014  . Vaginal itching 01/12/2015  . Vestibulitis, vulvar 04/21/2014  . Yeast  infection 03/13/2014    Past Surgical History:  Procedure Laterality Date  . BACK SURGERY    . LAMINECTOMY AND MICRODISCECTOMY LUMBAR SPINE Right 08/21/2015   L4-5 and L5-S 1    There were no vitals filed for this visit.   Pelvic Floor Physical Therapy Treatment Note  SCREENING  Changes in medications, allergies, or medical history?: none  SUBJECTIVE  Patient reports: Has been wearing her heel-lift. Has spasms when she is leaving work, trying to get to the car. Has been having less pain overall.  Precautions:  none  Pain update:  Location of pain: RUQ (galbladder), occasionally in pelvis Current pain:  0/10 (other than gallbladder) Max pain:  4/10 Least pain:  0/10 Nature of pain: sharp constant pain, strong grab/sharp in pelvis  *Following treatment, mild irritation of R hip due to focus on alignment on L.  Patient Goals: Decrease leakage, not have pain with intercourse or at rest.   OBJECTIVE  Changes in: Posture/Observations:  Hyperlordosis,   Range of Motion/Flexibilty:  Decreased sacral mobility B  Abdominal:  Slight increased pain with TA recruitment past a certain point.  Palpation: TTP to R QL, iliacus and Psoas  INTERVENTIONS THIS SESSION: Manual: Performed TP release  to R QL and distal Iliacus/Psoas to decrease spasm and pain and allow for improved balance of musculature for improved function and decreased symptoms.  Therex: Educated  on and practiced cat-cow in quadruped to improve coordination and recruitment of deep-core musculature and to improve strength of muscles opposing tight musculature to allow reciprocal inhibition to improve balance of musculature surrounding the pelvis and improve overall posture for optimal musculature length-tension relationship and function. Self-care: Educated on using a sling-bag for her lunch bag to minimize imbalanced forces acting on her body with AMB and decreased pain with returning to car. Dry-needle:  Performed TPDN with .30x57m needle as described below to decrease spasm and pain and allow for improved balance of musculature for improved function and decreased symptoms.   Total time: 60 min.                      Trigger Point Dry Needling - 09/28/18 0001    Consent Given?  Yes    Education Handout Provided  No    Muscles Treated Back/Hip  Iliacus;Erector spinae    Other Dry Needling  right    Iliacus Response  Twitch response elicited;Palpable increased muscle length    Erector spinae Response  Twitch response elicited;Palpable increased muscle length             PT Short Term Goals - 09/05/18 0920      PT SHORT TERM GOAL #1   Title  Patient will demonstrate improved pelvic alignment and balance of musculature surrounding the pelvis to facilitate decreased PFM spasms and decrease pelvic pain.    Baseline  LLE long by 3 cm, spasms surrounding    Time  5    Period  Weeks    Status  New    Target Date  10/10/18      PT SHORT TERM GOAL #2   Title  Patient will report a reduction in pain to no greater than 5/10 over the prior week to demonstrate symptom improvement.    Baseline  Max pain of 10/10    Time  5    Period  Weeks    Status  New    Target Date  10/10/18      PT SHORT TERM GOAL #3   Title  Patient will demonstrate HEP x1 in the clinic to demonstrate understanding and proper form to allow for further improvement.    Baseline  Pt. lacks knowledge of the therepeutic exercises that will help her decrease her pain and incontinence.    Time  5    Period  Weeks    Status  New    Target Date  10/10/18        PT Long Term Goals - 09/05/18 0923      PT LONG TERM GOAL #1   Title  Patient will report no episodes of SUI/UUI over the course of the prior two weeks to demonstrate improved functional ability.    Baseline  Having leakage all day, wearing 1 pantyliner per day    Time  10    Period  Weeks    Status  New    Target Date  11/14/18       PT LONG TERM GOAL #2   Title  Patient will report no pain with intercourse to demonstrate improved functional ability.    Baseline  Pt. having pain with all intercourse    Time  10    Period  Weeks    Status  New    Target Date  11/14/18      PT LONG TERM GOAL #3   Title  Patient will score at or  below 60/300 on the PFDI and 21/300 on the PFIQ to demonstrate a clinically meaningful decrease in disability and distress due to pelvic floor dysfunction.    Baseline  PFDI: 119/300, PFIQ: 42/300    Time  10    Period  Weeks    Status  New    Target Date  11/14/18      PT LONG TERM GOAL #4   Title  Patient will score less than or equal to 20% on the Female NIH-CPSI to demonstrate a reduction in pain, urinary symptoms, and an improved quality of life.    Baseline  Female NIH-CPSI: 23/43 (53%)    Time  10    Period  Weeks    Status  New    Target Date  11/14/18      PT LONG TERM GOAL #5   Title  Patient will describe pain no greater than 2/10 during work, exercise, and ADL's to demonstrate improved functional ability.    Baseline  10/10 max pain, 2/10 at best    Time  10    Period  Weeks    Status  New    Target Date  11/14/18            Plan - 09/28/18 0901    Clinical Impression Statement  Pt. Responded well to all interventions today, demonstrating improved R hip EXT ROM and decreased pain as well as understanding and correct performance of all education and exercises provided today. They will continue to benefit from skilled physical therapy to work toward remaining goals and maximize function as well as decrease likelihood of symptom increase or recurrence.     Personal Factors and Comorbidities  Comorbidity 3+;Fitness    Comorbidities  Anxiety, Depression, Diabetes, Prior laminectomy at L4-5-S1    Examination-Activity Limitations  Lift;Squat;Stand;Continence;Sit    Examination-Participation Restrictions  Interpersonal Relationship    Stability/Clinical Decision Making   Unstable/Unpredictable    Rehab Potential  Good    PT Frequency  1x / week    PT Duration  Other (comment)   10 weeks   PT Treatment/Interventions  ADLs/Self Care Home Management;Electrical Stimulation;Moist Heat;Functional mobility training;Therapeutic activities;Therapeutic exercise;Neuromuscular re-education;Patient/family education;Manual techniques;Scar mobilization;Passive range of motion;Dry needling;Taping;Spinal Manipulations;Joint Manipulations;Biofeedback;Ultrasound;Traction;Orthotic Fit/Training    PT Next Visit Plan  review/expand posture education, HEP for lower crossed    PT Home Exercise Plan  posterior pelvic tilts, hip-flexor stretch, cat-cow.    Consulted and Agree with Plan of Care  Patient       Patient will benefit from skilled therapeutic intervention in order to improve the following deficits and impairments:  Decreased endurance, Decreased range of motion, Decreased strength, Hypomobility, Increased fascial restricitons, Increased muscle spasms, Impaired flexibility, Improper body mechanics, Postural dysfunction, Obesity, Pain, Abnormal gait, Decreased coordination, Decreased activity tolerance  Visit Diagnosis: Abnormal posture  Other muscle spasm  Chronic bilateral low back pain with sciatica, sciatica laterality unspecified     Problem List Patient Active Problem List   Diagnosis Date Noted  . Calculus of gallbladder without cholecystitis without obstruction 09/05/2018  . Fatty liver 09/05/2018  . Nausea 08/30/2018  . RUQ pain 08/30/2018  . Pelvic pain 08/30/2018  . Pain with urination 08/30/2018  . 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 Family history of breast cancer   . Family history of melanoma   . Urge incontinence 03/23/2016  . Diarrhea 12/16/2015  . Change in bowel habits 12/16/2015  . Generalized abdominal pain 12/16/2015  . Rectal bleeding 12/16/2015  .  Pulmonary edema 08/21/2015  . Anxiety 03/17/2015  . IC  (interstitial cystitis) 01/22/2015  . Vaginal itching 01/12/2015  . Hemorrhoids 01/12/2015  . History of rectal bleeding 01/12/2015  . Vestibulitis, vulvar 04/21/2014  . BV (bacterial vaginosis) 04/10/2014  . Vaginal discharge 03/13/2014  . Yeast infection 03/13/2014  . Encounter for menstrual regulation 03/13/2014   Willa Rough DPT, ATC Willa Rough 09/28/2018, 9:03 AM  [place] MAIN [facility] SERVICES [address] [place], [place], [address] Phone: [phone]   Fax:  [phone]  Name: Karen Hoover MRN: [id number] Date of Birth: 1990-10-27

## 2018-10-01 ENCOUNTER — Ambulatory Visit: Payer: 59

## 2018-10-04 ENCOUNTER — Ambulatory Visit: Payer: 59

## 2018-10-09 ENCOUNTER — Other Ambulatory Visit: Payer: Self-pay

## 2018-10-09 ENCOUNTER — Ambulatory Visit: Payer: 59 | Attending: Neurosurgery

## 2018-10-09 DIAGNOSIS — M5440 Lumbago with sciatica, unspecified side: Secondary | ICD-10-CM | POA: Diagnosis not present

## 2018-10-09 DIAGNOSIS — R293 Abnormal posture: Secondary | ICD-10-CM | POA: Diagnosis not present

## 2018-10-09 DIAGNOSIS — G8929 Other chronic pain: Secondary | ICD-10-CM | POA: Diagnosis not present

## 2018-10-09 DIAGNOSIS — M62838 Other muscle spasm: Secondary | ICD-10-CM | POA: Diagnosis not present

## 2018-10-09 NOTE — Patient Instructions (Addendum)
  Leaning forward on a counter top, tuck your hips under until you feel the low tummy muscles working and keep the tuck as you bring your hips forward until your shoulder, hip, knee, and ankle are all in a straight line. Hold for ~ 30 sec. Or until your muscles fatigue, before you lose your good form. Repeat until you have done a total of 1:30, in as long or short of holds as is necessary to keep good form.    Hip Abduction: Side Leg Lift - Side-Lying    Lie on side. Draw lower tummy muscle (TA) and pelvic floor in, Lift top leg until you feel strong contraction of muscle on the side of the hip. Keep top leg straight with body, toes pointing forward. Do not let your hip roll back! Do _10__ reps per set, __3_ sets per day  Hip Extension:    Lying face down, raise leg just off floor squeezing glutes. Keep leg straight. Hold 1 count. Lower leg to floor. Do _10__ reps per set, __3_ sets for each leg. Optional: Place small pillow under abdomen if back becomes uncomfortable.    Tuck your hips under, then keep the tuck as you lean forward so you feel a stretch through your low back. Hold for 5 deep breaths, repeat 2-3 times, 1-2 times per day.    Bring feet together and let the knees fall out to the sides. Hold for 5 deep breaths, rest then repeat 2 more times.

## 2018-10-09 NOTE — Patient Instructions (Signed)
Your procedure is scheduled on: 10/17/2018  [facility] to [facility] at    6:15 AM.  Call this number if you have problems the morning of surgery: [phone]   Remember:   Do not Eat or Drink after midnight   :  Take these medicines the morning of surgery with A SIP OF WATER: Lexapro, Flonase, Lovothyroxine, and tramadol if needed   Do not wear jewelry, make-up or nail polish.  Do not wear lotions, powders, or perfumes. You may wear deodorant.  Do not shave 48 hours prior to surgery. Men may shave face and neck.  Do not bring valuables to the hospital.  Contacts, dentures or bridgework may not be worn into surgery.  Leave suitcase in the car. After surgery it may be brought to your room.  For patients admitted to the hospital, checkout time is 11:00 AM the day of discharge.   Patients discharged the day of surgery will not be allowed to drive home.    Special Instructions: Shower using CHG night before surgery and shower the day of surgery use CHG.  Use special wash - you have one bottle of CHG for all showers.  You should use approximately 1/2 of the bottle for each shower.  Laparoscopic Cholecystectomy, Care After This sheet gives you information about how to care for yourself after your procedure. Your health care provider may also give you more specific instructions. If you have problems or questions, contact your health care provider. What can I expect after the procedure? After the procedure, it is common to have:  Pain at your incision sites. You will be given medicines to control this pain.  Mild nausea or vomiting.  Bloating and possible shoulder pain from the air-like gas that was used during the procedure. Follow these instructions at home: Incision care   Follow instructions from your health care provider about how to take care of your incisions. Make sure you: ? Wash your hands with soap and water before you change your bandage (dressing). If soap and water are not  available, use hand sanitizer. ? Change your dressing as told by your health care provider. ? Leave stitches (sutures), skin glue, or adhesive strips in place. These skin closures may need to be in place for 2 weeks or longer. If adhesive strip edges start to loosen and curl up, you may trim the loose edges. Do not remove adhesive strips completely unless your health care provider tells you to do that.  Do not take baths, swim, or use a hot tub until your health care provider approves. Ask your health care provider if you can take showers. You may only be allowed to take sponge baths for bathing.  Check your incision area every day for signs of infection. Check for: ? More redness, swelling, or pain. ? More fluid or blood. ? Warmth. ? Pus or a bad smell. Activity  Do not drive or use heavy machinery while taking prescription pain medicine.  Do not lift anything that is heavier than 10 lb (4.5 kg) until your health care provider approves.  Do not play contact sports until your health care provider approves.  Do not drive for 24 hours if you were given a medicine to help you relax (sedative).  Rest as needed. Do not return to work or school until your health care provider approves. General instructions  Take over-the-counter and prescription medicines only as told by your health care provider.  To prevent or treat constipation while you are  taking prescription pain medicine, your health care provider may recommend that you: ? Drink enough fluid to keep your urine clear or pale yellow. ? Take over-the-counter or prescription medicines. ? Eat foods that are high in fiber, such as fresh fruits and vegetables, whole grains, and beans. ? Limit foods that are high in fat and processed sugars, such as fried and sweet foods. Contact a health care provider if:  You develop a rash.  You have more redness, swelling, or pain around your incisions.  You have more fluid or blood coming from your  incisions.  Your incisions feel warm to the touch.  You have pus or a bad smell coming from your incisions.  You have a fever.  One or more of your incisions breaks open. Get help right away if:  You have trouble breathing.  You have chest pain.  You have increasing pain in your shoulders.  You faint or feel dizzy when you stand.  You have severe pain in your abdomen.  You have nausea or vomiting that lasts for more than one day.  You have leg pain. This information is not intended to replace advice given to you by your health care provider. Make sure you discuss any questions you have with your health care provider. Document Released: 01/24/2005 Document Revised: 01/06/2017 Document Reviewed: 07/13/2015 Elsevier Patient Education  2020 [employer] [facility], Adult, Care After This sheet gives you information about how to care for yourself after your procedure. Your health care provider may also give you more specific instructions. If you have problems or questions, contact your health care provider. What can I expect after the procedure? After the procedure, the following side effects are common:  Pain or discomfort at the IV site.  Nausea.  Vomiting.  Sore throat.  Trouble concentrating.  Feeling cold or chills.  Weak or tired.  Sleepiness and fatigue.  Soreness and body aches. These side effects can affect parts of the body that were not involved in surgery. Follow these instructions at home:  For at least 24 hours after the procedure:  Have a responsible adult stay with you. It is important to have someone help care for you until you are awake and alert.  Rest as needed.  Do not: ? Participate in activities in which you could fall or become injured. ? Drive. ? Use heavy machinery. ? Drink alcohol. ? Take sleeping pills or medicines that cause drowsiness. ? Make important decisions or sign legal documents. ? Take care of children on  your own. Eating and drinking  Follow any instructions from your health care provider about eating or drinking restrictions.  When you feel hungry, start by eating small amounts of foods that are soft and easy to digest (bland), such as toast. Gradually return to your regular diet.  Drink enough fluid to keep your urine pale yellow.  If you vomit, rehydrate by drinking water, juice, or clear broth. General instructions  If you have sleep apnea, surgery and certain medicines can increase your risk for breathing problems. Follow instructions from your health care provider about wearing your sleep device: ? Anytime you are sleeping, including during daytime naps. ? While taking prescription pain medicines, sleeping medicines, or medicines that make you drowsy.  Return to your normal activities as told by your health care provider. Ask your health care provider what activities are safe for you.  Take over-the-counter and prescription medicines only as told by your health care provider.  If you  smoke, do not smoke without supervision.  Keep all follow-up visits as told by your health care provider. This is important. Contact a health care provider if:  You have nausea or vomiting that does not get better with medicine.  You cannot eat or drink without vomiting.  You have pain that does not get better with medicine.  You are unable to pass urine.  You develop a skin rash.  You have a fever.  You have redness around your IV site that gets worse. Get help right away if:  You have difficulty breathing.  You have chest pain.  You have blood in your urine or stool, or you vomit blood. Summary  After the procedure, it is common to have a sore throat or nausea. It is also common to feel tired.  Have a responsible adult stay with you for the first 24 hours after general anesthesia. It is important to have someone help care for you until you are awake and alert.  When you feel  hungry, start by eating small amounts of foods that are soft and easy to digest (bland), such as toast. Gradually return to your regular diet.  Drink enough fluid to keep your urine pale yellow.  Return to your normal activities as told by your health care provider. Ask your health care provider what activities are safe for you. This information is not intended to replace advice given to you by your health care provider. Make sure you discuss any questions you have with your health care provider. Document Released: 05/02/2000 Document Revised: 01/27/2017 Document Reviewed: 09/09/2016 Elsevier Patient Education  2020 [employer].

## 2018-10-09 NOTE — Therapy (Signed)
[place] [facility] SERVICES [address] [place], [place], [address] Phone: [phone]   Fax:  [phone]  Physical Therapy Treatment  The patient has been informed of current processes in place at Outpatient Rehab to protect patients from Covid-19 exposure including social distancing, schedule modifications, and new cleaning procedures. After discussing their particular risk with a therapist based on the patient's personal risk factors, the patient has decided to proceed with in-person therapy.   Patient Details  Name: Karen Hoover MRN: [id number] Date of Birth: 03/20/1990 Referring Provider (PT): Erline Levine   Encounter Date: 10/09/2018  PT End of Session - 10/09/18 0845    Visit Number  4    Number of Visits  10    Date for PT Re-Evaluation  11/14/18    Authorization Type  [facility]    Authorization Time Period  through 11/14/2018    Authorization - Visit Number  4    Authorization - Number of Visits  10   clinical eligibility required after 25th visit   PT Start Time  0811    PT Stop Time  0911    PT Time Calculation (min)  60 min    Activity Tolerance  Patient tolerated treatment well;No increased pain    Behavior During Therapy  WFL for tasks assessed/performed       Past Medical History:  Diagnosis Date  . Anemia   . Anxiety 03/17/2015  . BV (bacterial vaginosis) 04/10/2014  . Chronic lower back pain    2 herniated disc sees Dr Carloyn Manner  . Encounter for menstrual regulation 03/13/2014  . Family history of breast cancer   . Family history of melanoma   . Fatty liver 09/05/2018  . Gallstones 09/05/2018   Refer to Dr Constance Haw  . Hemorrhoids 01/12/2015  . History of rectal bleeding 01/12/2015  . Hypothyroidism   . IC (interstitial cystitis) 01/22/2015  . Pulmonary edema 08/21/2015   S/P back OR  . Thyroid disease   . Vaginal discharge 03/13/2014  . Vaginal itching 01/12/2015  . Vestibulitis, vulvar 04/21/2014  . Yeast infection  03/13/2014    Past Surgical History:  Procedure Laterality Date  . BACK SURGERY    . LAMINECTOMY AND MICRODISCECTOMY LUMBAR SPINE Right 08/21/2015   L4-5 and L5-S 1    There were no vitals filed for this visit.    Pelvic Floor Physical Therapy Treatment Note  SCREENING  Changes in medications, allergies, or medical history?: none  SUBJECTIVE  Patient reports: Feeling back pain when she does a lot of bending/walking, just got off work so she is having some today.  Precautions:  none  Pain update:  Location of pain: RUQ (galbladder), occasionally in back Current pain:  3/10 (other than gallbladder) Max pain:  10/10 (galbladder after had done a bunch of stuff one day, average of 5/10 when irritated) Least pain:  0/10 Nature of pain: sharp pain, dull ache in LB *  Patient Goals: Decrease leakage, not have pain with intercourse or at rest.   OBJECTIVE  Changes in: Posture/Observations:  Hyperlordosis,   Range of Motion/Flexibilty:  Decreased SB/overhead reach to L>R  Abdominal:  Able to perform neutral spine core  Stability without pain.  Strength: ~ 3+/5 strength fpr R hip ABD and EXT, 4/5 for L.  INTERVENTIONS THIS SESSION: Therex: Educated on and practiced Hip EXT and ABD in lying 2x10 and counter-top plank 5x to improve strength of muscles opposing tight musculature to allow reciprocal inhibition to  improve balance of musculature surrounding the pelvis and improve overall posture for optimal musculature length-tension relationship and function. Educated on and practiced Supine adductor stretch and standing LB stretch x3 To maintain and improve muscle length and allow for improved balance of musculature for long-term symptom relief.     Total time: 60 min.                                  PT Short Term Goals - 09/05/18 0920      PT SHORT TERM GOAL #1   Title  Patient will demonstrate improved pelvic alignment and balance of  musculature surrounding the pelvis to facilitate decreased PFM spasms and decrease pelvic pain.    Baseline  LLE long by 3 cm, spasms surrounding    Time  5    Period  Weeks    Status  New    Target Date  10/10/18      PT SHORT TERM GOAL #2   Title  Patient will report a reduction in pain to no greater than 5/10 over the prior week to demonstrate symptom improvement.    Baseline  Max pain of 10/10    Time  5    Period  Weeks    Status  New    Target Date  10/10/18      PT SHORT TERM GOAL #3   Title  Patient will demonstrate HEP x1 in the clinic to demonstrate understanding and proper form to allow for further improvement.    Baseline  Pt. lacks knowledge of the therepeutic exercises that will help her decrease her pain and incontinence.    Time  5    Period  Weeks    Status  New    Target Date  10/10/18        PT Long Term Goals - 09/05/18 0923      PT LONG TERM GOAL #1   Title  Patient will report no episodes of SUI/UUI over the course of the prior two weeks to demonstrate improved functional ability.    Baseline  Having leakage all day, wearing 1 pantyliner per day    Time  10    Period  Weeks    Status  New    Target Date  11/14/18      PT LONG TERM GOAL #2   Title  Patient will report no pain with intercourse to demonstrate improved functional ability.    Baseline  Pt. having pain with all intercourse    Time  10    Period  Weeks    Status  New    Target Date  11/14/18      PT LONG TERM GOAL #3   Title  Patient will score at or below 60/300 on the PFDI and 21/300 on the PFIQ to demonstrate a clinically meaningful decrease in disability and distress due to pelvic floor dysfunction.    Baseline  PFDI: 119/300, PFIQ: 42/300    Time  10    Period  Weeks    Status  New    Target Date  11/14/18      PT LONG TERM GOAL #4   Title  Patient will score less than or equal to 20% on the Female NIH-CPSI to demonstrate a reduction in pain, urinary symptoms, and an improved  quality of life.    Baseline  Female NIH-CPSI: 23/43 (53%)    Time  10  Period  Weeks    Status  New    Target Date  11/14/18      PT LONG TERM GOAL #5   Title  Patient will describe pain no greater than 2/10 during work, exercise, and ADL's to demonstrate improved functional ability.    Baseline  10/10 max pain, 2/10 at best    Time  10    Period  Weeks    Status  New    Target Date  11/14/18            Plan - 10/09/18 0845    Clinical Impression Statement  Pt. Responded well to all interventions today, demonstrating improved recruitment of TA, R hip EXT and ABD as well as understanding and correct performance of all education and exercises provided today. They will continue to benefit from skilled physical therapy to work toward remaining goals and maximize function as well as decrease likelihood of symptom increase or recurrence.     Personal Factors and Comorbidities  Comorbidity 3+;Fitness    Comorbidities  Anxiety, Depression, Diabetes, Prior laminectomy at L4-5-S1    Examination-Activity Limitations  Lift;Squat;Stand;Continence;Sit    Examination-Participation Restrictions  Interpersonal Relationship    Stability/Clinical Decision Making  Unstable/Unpredictable    Rehab Potential  Good    PT Frequency  1x / week    PT Duration  Other (comment)   10 weeks   PT Treatment/Interventions  ADLs/Self Care Home Management;Electrical Stimulation;Moist Heat;Functional mobility training;Therapeutic activities;Therapeutic exercise;Neuromuscular re-education;Patient/family education;Manual techniques;Scar mobilization;Passive range of motion;Dry needling;Taping;Spinal Manipulations;Joint Manipulations;Biofeedback;Ultrasound;Traction;Orthotic Fit/Training    PT Next Visit Plan  review/expand posture education, HEP for upper crossed    PT Home Exercise Plan  posterior pelvic tilts, hip-flexor stretch, counter-top plank, hip ABD/EXT in lying, LB stretch in standing, supine butterfly.     Consulted and Agree with Plan of Care  Patient       Patient will benefit from skilled therapeutic intervention in order to improve the following deficits and impairments:  Decreased endurance, Decreased range of motion, Decreased strength, Hypomobility, Increased fascial restricitons, Increased muscle spasms, Impaired flexibility, Improper body mechanics, Postural dysfunction, Obesity, Pain, Abnormal gait, Decreased coordination, Decreased activity tolerance  Visit Diagnosis: Abnormal posture  Other muscle spasm  Chronic bilateral low back pain with sciatica, sciatica laterality unspecified     Problem List Patient Active Problem List   Diagnosis Date Noted  . Calculus of gallbladder without cholecystitis without obstruction 09/05/2018  . Fatty liver 09/05/2018  . Nausea 08/30/2018  . RUQ pain 08/30/2018  . Pelvic pain 08/30/2018  . Pain with urination 08/30/2018  . 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 Family history of breast cancer   . Family history of melanoma   . Urge incontinence 03/23/2016  . Diarrhea 12/16/2015  . Change in bowel habits 12/16/2015  . Generalized abdominal pain 12/16/2015  . Rectal bleeding 12/16/2015  . Pulmonary edema 08/21/2015  . Anxiety 03/17/2015  . IC (interstitial cystitis) 01/22/2015  . Vaginal itching 01/12/2015  . Hemorrhoids 01/12/2015  . History of rectal bleeding 01/12/2015  . Vestibulitis, vulvar 04/21/2014  . BV (bacterial vaginosis) 04/10/2014  . Vaginal discharge 03/13/2014  . Yeast infection 03/13/2014  . Encounter for menstrual regulation 03/13/2014   Willa Rough DPT, ATC Willa Rough 10/09/2018, 9:21 AM  [place] [facility] SERVICES [address] [place], [place], [address] Phone: [phone]   Fax:  [phone]  Name: Eleni Frank MRN: [id number] Date of Birth: 09/19/1990

## 2018-10-10 ENCOUNTER — Other Ambulatory Visit: Payer: 59 | Admitting: Adult Health

## 2018-10-16 ENCOUNTER — Other Ambulatory Visit: Payer: Self-pay

## 2018-10-16 ENCOUNTER — Other Ambulatory Visit (HOSPITAL_COMMUNITY)
Admission: RE | Admit: 2018-10-16 | Discharge: 2018-10-16 | Disposition: A | Discharge status: Home / Self Care | Payer: 59 | Source: Ambulatory Visit | Attending: General Surgery | Admitting: General Surgery

## 2018-10-16 ENCOUNTER — Encounter (HOSPITAL_COMMUNITY): Payer: Self-pay

## 2018-10-16 ENCOUNTER — Encounter (HOSPITAL_COMMUNITY)
Admission: RE | Admit: 2018-10-16 | Discharge: 2018-10-16 | Disposition: A | Discharge status: Home / Self Care | Payer: 59 | Source: Ambulatory Visit | Attending: General Surgery | Admitting: General Surgery

## 2018-10-16 DIAGNOSIS — Z79890 Hormone replacement therapy (postmenopausal): Secondary | ICD-10-CM | POA: Diagnosis not present

## 2018-10-16 DIAGNOSIS — M549 Dorsalgia, unspecified: Secondary | ICD-10-CM | POA: Diagnosis not present

## 2018-10-16 DIAGNOSIS — Z7982 Long term (current) use of aspirin: Secondary | ICD-10-CM | POA: Diagnosis not present

## 2018-10-16 DIAGNOSIS — Z6841 Body Mass Index (BMI) 40.0 and over, adult: Secondary | ICD-10-CM | POA: Diagnosis not present

## 2018-10-16 DIAGNOSIS — G4730 Sleep apnea, unspecified: Secondary | ICD-10-CM | POA: Diagnosis not present

## 2018-10-16 DIAGNOSIS — E1165 Type 2 diabetes mellitus with hyperglycemia: Secondary | ICD-10-CM | POA: Diagnosis not present

## 2018-10-16 DIAGNOSIS — Z7984 Long term (current) use of oral hypoglycemic drugs: Secondary | ICD-10-CM | POA: Diagnosis not present

## 2018-10-16 DIAGNOSIS — F419 Anxiety disorder, unspecified: Secondary | ICD-10-CM | POA: Diagnosis not present

## 2018-10-16 DIAGNOSIS — G8929 Other chronic pain: Secondary | ICD-10-CM | POA: Diagnosis not present

## 2018-10-16 DIAGNOSIS — K8080 Other cholelithiasis without obstruction: Secondary | ICD-10-CM | POA: Diagnosis present

## 2018-10-16 DIAGNOSIS — Z79899 Other long term (current) drug therapy: Secondary | ICD-10-CM | POA: Diagnosis not present

## 2018-10-16 DIAGNOSIS — E039 Hypothyroidism, unspecified: Secondary | ICD-10-CM | POA: Diagnosis not present

## 2018-10-16 DIAGNOSIS — K8010 Calculus of gallbladder with chronic cholecystitis without obstruction: Secondary | ICD-10-CM | POA: Diagnosis not present

## 2018-10-16 HISTORY — DX: Type 2 diabetes mellitus without complications: E11.9

## 2018-10-16 HISTORY — DX: Sleep apnea, unspecified: G47.30

## 2018-10-16 HISTORY — DX: Other complications of anesthesia, initial encounter: T88.59XA

## 2018-10-16 LAB — CBC WITH DIFFERENTIAL/PLATELET
Abs Immature Granulocytes: 0.07 10*3/uL (ref 0.00–0.07)
Basophils Absolute: 0 10*3/uL (ref 0.0–0.1)
Basophils Relative: 0 %
Eosinophils Absolute: 0.3 10*3/uL (ref 0.0–0.5)
Eosinophils Relative: 2 %
HCT: 38.5 % (ref 36.0–46.0)
Hemoglobin: 11.9 g/dL — ABNORMAL LOW (ref 12.0–15.0)
Immature Granulocytes: 1 %
Lymphocytes Relative: 22 %
Lymphs Abs: 3 10*3/uL (ref 0.7–4.0)
MCH: 25.6 pg — ABNORMAL LOW (ref 26.0–34.0)
MCHC: 30.9 g/dL (ref 30.0–36.0)
MCV: 83 fL (ref 80.0–100.0)
Monocytes Absolute: 0.7 10*3/uL (ref 0.1–1.0)
Monocytes Relative: 5 %
Neutro Abs: 9.5 10*3/uL — ABNORMAL HIGH (ref 1.7–7.7)
Neutrophils Relative %: 70 %
Platelets: 408 10*3/uL — ABNORMAL HIGH (ref 150–400)
RBC: 4.64 MIL/uL (ref 3.87–5.11)
RDW: 13.9 % (ref 11.5–15.5)
WBC: 13.7 10*3/uL — ABNORMAL HIGH (ref 4.0–10.5)
nRBC: 0 % (ref 0.0–0.2)

## 2018-10-16 LAB — COMPREHENSIVE METABOLIC PANEL
ALT: 24 U/L (ref 0–44)
AST: 20 U/L (ref 15–41)
Albumin: 3.6 g/dL (ref 3.5–5.0)
Alkaline Phosphatase: 49 U/L (ref 38–126)
Anion gap: 10 (ref 5–15)
BUN: 11 mg/dL (ref 6–20)
CO2: 23 mmol/L (ref 22–32)
Calcium: 9.1 mg/dL (ref 8.9–10.3)
Chloride: 103 mmol/L (ref 98–111)
Creatinine, Ser: 0.45 mg/dL (ref 0.44–1.00)
GFR calc Af Amer: >60 mL/min (ref >60)
GFR calc non Af Amer: >60 mL/min (ref >60)
Glucose, Bld: 230 mg/dL — ABNORMAL HIGH (ref 70–99)
Potassium: 4.5 mmol/L (ref 3.5–5.1)
Sodium: 136 mmol/L (ref 135–145)
Total Bilirubin: 0.7 mg/dL (ref 0.3–1.2)
Total Protein: 7.1 g/dL (ref 6.5–8.1)

## 2018-10-16 LAB — HCG, QUANTITATIVE, PREGNANCY: hCG, Beta Chain, Quant, S: <1 m[IU]/mL (ref <5)

## 2018-10-16 LAB — GLUCOSE, CAPILLARY: Glucose-Capillary: 226 mg/dL — ABNORMAL HIGH (ref 70–99)

## 2018-10-17 ENCOUNTER — Encounter (HOSPITAL_COMMUNITY)
Admission: RE | Disposition: A | Discharge status: Home / Self Care | Payer: Self-pay | Source: Home / Self Care | Attending: General Surgery

## 2018-10-17 ENCOUNTER — Ambulatory Visit (HOSPITAL_COMMUNITY): Payer: 59 | Admitting: Anesthesiology

## 2018-10-17 ENCOUNTER — Ambulatory Visit (HOSPITAL_COMMUNITY)
Admission: RE | Admit: 2018-10-17 | Discharge: 2018-10-17 | Disposition: A | Discharge status: Home / Self Care | Payer: 59 | Attending: General Surgery | Admitting: General Surgery

## 2018-10-17 ENCOUNTER — Encounter (HOSPITAL_COMMUNITY): Payer: Self-pay | Admitting: *Deleted

## 2018-10-17 DIAGNOSIS — M549 Dorsalgia, unspecified: Secondary | ICD-10-CM | POA: Diagnosis not present

## 2018-10-17 DIAGNOSIS — F419 Anxiety disorder, unspecified: Secondary | ICD-10-CM | POA: Diagnosis not present

## 2018-10-17 DIAGNOSIS — K8020 Calculus of gallbladder without cholecystitis without obstruction: Secondary | ICD-10-CM | POA: Diagnosis not present

## 2018-10-17 DIAGNOSIS — E1165 Type 2 diabetes mellitus with hyperglycemia: Secondary | ICD-10-CM | POA: Diagnosis not present

## 2018-10-17 DIAGNOSIS — G4730 Sleep apnea, unspecified: Secondary | ICD-10-CM | POA: Diagnosis not present

## 2018-10-17 DIAGNOSIS — Z79890 Hormone replacement therapy (postmenopausal): Secondary | ICD-10-CM | POA: Insufficient documentation

## 2018-10-17 DIAGNOSIS — Z7982 Long term (current) use of aspirin: Secondary | ICD-10-CM | POA: Insufficient documentation

## 2018-10-17 DIAGNOSIS — K8010 Calculus of gallbladder with chronic cholecystitis without obstruction: Secondary | ICD-10-CM | POA: Insufficient documentation

## 2018-10-17 DIAGNOSIS — G8929 Other chronic pain: Secondary | ICD-10-CM | POA: Insufficient documentation

## 2018-10-17 DIAGNOSIS — E039 Hypothyroidism, unspecified: Secondary | ICD-10-CM | POA: Insufficient documentation

## 2018-10-17 DIAGNOSIS — Z79899 Other long term (current) drug therapy: Secondary | ICD-10-CM | POA: Insufficient documentation

## 2018-10-17 DIAGNOSIS — Z6841 Body Mass Index (BMI) 40.0 and over, adult: Secondary | ICD-10-CM | POA: Insufficient documentation

## 2018-10-17 DIAGNOSIS — Z7984 Long term (current) use of oral hypoglycemic drugs: Secondary | ICD-10-CM | POA: Insufficient documentation

## 2018-10-17 HISTORY — PX: CHOLECYSTECTOMY: SHX55

## 2018-10-17 LAB — SARS CORONAVIRUS 2 (TAT 6-24 HRS): SARS Coronavirus 2: NEGATIVE

## 2018-10-17 LAB — GLUCOSE, CAPILLARY
Glucose-Capillary: 130 mg/dL — ABNORMAL HIGH (ref 70–99)
Glucose-Capillary: 163 mg/dL — ABNORMAL HIGH (ref 70–99)

## 2018-10-17 LAB — HEMOGLOBIN A1C
Hgb A1c MFr Bld: 6.8 % — ABNORMAL HIGH (ref 4.8–5.6)
Mean Plasma Glucose: 148 mg/dL

## 2018-10-17 SURGERY — LAPAROSCOPIC CHOLECYSTECTOMY
Anesthesia: General | Site: Abdomen

## 2018-10-17 MED ORDER — ONDANSETRON HCL 4 MG/2ML IJ SOLN
INTRAMUSCULAR | Status: DC | PRN
  Administered 2018-10-17: 4 mg via INTRAVENOUS

## 2018-10-17 MED ORDER — HEMOSTATIC AGENTS (NO CHARGE) OPTIME
TOPICAL | Status: DC | PRN
  Administered 2018-10-17: 1 via TOPICAL

## 2018-10-17 MED ORDER — BUPIVACAINE HCL (PF) 0.5 % IJ SOLN
INTRAMUSCULAR | Status: DC | PRN
  Administered 2018-10-17: 20 mL

## 2018-10-17 MED ORDER — GLYCOPYRROLATE PF 0.2 MG/ML IJ SOSY
PREFILLED_SYRINGE | INTRAMUSCULAR | Status: AC
  Filled 2018-10-17: qty 1

## 2018-10-17 MED ORDER — PROPOFOL 10 MG/ML IV BOLUS
INTRAVENOUS | Status: AC
  Filled 2018-10-17: qty 40

## 2018-10-17 MED ORDER — CHLORHEXIDINE GLUCONATE CLOTH 2 % EX PADS: 6.0000 | MEDICATED_PAD | Freq: Once | CUTANEOUS | Status: DC

## 2018-10-17 MED ORDER — LIDOCAINE 2% (20 MG/ML) 5 ML SYRINGE
INTRAMUSCULAR | Status: DC | PRN
  Administered 2018-10-17: 40 mg via INTRAVENOUS

## 2018-10-17 MED ORDER — ONDANSETRON HCL 4 MG PO TABS: 4.0000 mg | ORAL_TABLET | Freq: Every day | ORAL | 1 refills | Status: AC | PRN

## 2018-10-17 MED ORDER — SUGAMMADEX SODIUM 200 MG/2ML IV SOLN
INTRAVENOUS | Status: DC | PRN
  Administered 2018-10-17: 235.8 mg via INTRAVENOUS

## 2018-10-17 MED ORDER — FENTANYL CITRATE (PF) 250 MCG/5ML IJ SOLN
INTRAMUSCULAR | Status: AC
  Filled 2018-10-17: qty 5

## 2018-10-17 MED ORDER — SUCCINYLCHOLINE CHLORIDE 20 MG/ML IJ SOLN
INTRAMUSCULAR | Status: DC | PRN
  Administered 2018-10-17: 180 mg via INTRAVENOUS

## 2018-10-17 MED ORDER — MIDAZOLAM HCL 2 MG/2ML IJ SOLN: 0.5000 mg | Freq: Once | INTRAMUSCULAR | Status: DC | PRN

## 2018-10-17 MED ORDER — BUPIVACAINE HCL (PF) 0.5 % IJ SOLN
INTRAMUSCULAR | Status: AC
  Filled 2018-10-17: qty 30

## 2018-10-17 MED ORDER — FENTANYL CITRATE (PF) 100 MCG/2ML IJ SOLN
INTRAMUSCULAR | Status: DC | PRN
  Administered 2018-10-17: 100 ug via INTRAVENOUS
  Administered 2018-10-17 (×3): 50 ug via INTRAVENOUS

## 2018-10-17 MED ORDER — SODIUM CHLORIDE 0.9 % IR SOLN
Status: DC | PRN
  Administered 2018-10-17: 1

## 2018-10-17 MED ORDER — HYDROMORPHONE HCL 1 MG/ML IJ SOLN
0.2500 mg | INTRAMUSCULAR | Status: DC | PRN
  Administered 2018-10-17 (×4): 0.5 mg via INTRAVENOUS
  Filled 2018-10-17 (×4): qty 0.5

## 2018-10-17 MED ORDER — HYDROCODONE-ACETAMINOPHEN 7.5-325 MG PO TABS: 1.0000 | ORAL_TABLET | Freq: Once | ORAL | Status: DC | PRN

## 2018-10-17 MED ORDER — LIDOCAINE 2% (20 MG/ML) 5 ML SYRINGE
INTRAMUSCULAR | Status: AC
  Filled 2018-10-17: qty 5

## 2018-10-17 MED ORDER — ROCURONIUM BROMIDE 10 MG/ML (PF) SYRINGE
PREFILLED_SYRINGE | INTRAVENOUS | Status: AC
  Filled 2018-10-17: qty 10

## 2018-10-17 MED ORDER — PROPOFOL 10 MG/ML IV BOLUS
INTRAVENOUS | Status: DC | PRN
  Administered 2018-10-17: 30 mg via INTRAVENOUS
  Administered 2018-10-17: 200 mg via INTRAVENOUS

## 2018-10-17 MED ORDER — OXYCODONE HCL 5 MG PO TABS: 5.0000 mg | ORAL_TABLET | ORAL | 0 refills | Status: AC | PRN

## 2018-10-17 MED ORDER — PROMETHAZINE HCL 25 MG/ML IJ SOLN
6.2500 mg | INTRAMUSCULAR | Status: DC | PRN
  Administered 2018-10-17: 6.25 mg via INTRAVENOUS
  Filled 2018-10-17: qty 1

## 2018-10-17 MED ORDER — LACTATED RINGERS IV SOLN
INTRAVENOUS | Status: DC
  Administered 2018-10-17 (×2): via INTRAVENOUS

## 2018-10-17 MED ORDER — ONDANSETRON HCL 4 MG/2ML IJ SOLN
INTRAMUSCULAR | Status: AC
  Filled 2018-10-17: qty 4

## 2018-10-17 MED ORDER — ONDANSETRON HCL 4 MG/2ML IJ SOLN
INTRAMUSCULAR | Status: AC
  Filled 2018-10-17: qty 2

## 2018-10-17 MED ORDER — DEXAMETHASONE SODIUM PHOSPHATE 10 MG/ML IJ SOLN
INTRAMUSCULAR | Status: AC
  Filled 2018-10-17: qty 1

## 2018-10-17 MED ORDER — SODIUM CHLORIDE 0.9% FLUSH
INTRAVENOUS | Status: AC
  Filled 2018-10-17: qty 10

## 2018-10-17 MED ORDER — SODIUM CHLORIDE 0.9 % IV SOLN
INTRAVENOUS | Status: AC
  Filled 2018-10-17: qty 2

## 2018-10-17 MED ORDER — DOCUSATE SODIUM 100 MG PO CAPS: 100.0000 mg | ORAL_CAPSULE | Freq: Two times a day (BID) | ORAL | 2 refills | Status: AC

## 2018-10-17 MED ORDER — MIDAZOLAM HCL 2 MG/2ML IJ SOLN
INTRAMUSCULAR | Status: AC
  Filled 2018-10-17: qty 2

## 2018-10-17 MED ORDER — MIDAZOLAM HCL 5 MG/5ML IJ SOLN
INTRAMUSCULAR | Status: DC | PRN
  Administered 2018-10-17: 2 mg via INTRAVENOUS

## 2018-10-17 MED ORDER — ROCURONIUM BROMIDE 50 MG/5ML IV SOSY
PREFILLED_SYRINGE | INTRAVENOUS | Status: DC | PRN
  Administered 2018-10-17: 30 mg via INTRAVENOUS
  Administered 2018-10-17: 20 mg via INTRAVENOUS

## 2018-10-17 MED ORDER — DEXAMETHASONE SODIUM PHOSPHATE 4 MG/ML IJ SOLN
INTRAMUSCULAR | Status: DC | PRN
  Administered 2018-10-17: 10 mg via INTRAVENOUS

## 2018-10-17 MED ORDER — SODIUM CHLORIDE 0.9 % IV SOLN
2.0000 g | INTRAVENOUS | Status: AC
  Administered 2018-10-17: 2 g via INTRAVENOUS

## 2018-10-17 MED ORDER — SUCCINYLCHOLINE CHLORIDE 200 MG/10ML IV SOSY
PREFILLED_SYRINGE | INTRAVENOUS | Status: AC
  Filled 2018-10-17: qty 10

## 2018-10-17 SURGICAL SUPPLY — 56 items
ADH SKN CLS APL DERMABOND .7 (GAUZE/BANDAGES/DRESSINGS) ×1
APL PRP STRL LF DISP 70% ISPRP (MISCELLANEOUS) ×1
APPLIER CLIP ROT 10 11.4 M/L (STAPLE) ×2
APR CLP MED LRG 11.4X10 (STAPLE) ×1
BAG RETRIEVAL 10 (BASKET) ×2
BLADE SURG 15 STRL LF DISP TIS (BLADE) ×1 IMPLANT
BLADE SURG 15 STRL SS (BLADE) ×2
CHLORAPREP W/TINT 26 (MISCELLANEOUS) ×2 IMPLANT
CLIP APPLIE ROT 10 11.4 M/L (STAPLE) ×1 IMPLANT
CLOTH BEACON ORANGE TIMEOUT ST (SAFETY) ×2 IMPLANT
COVER LIGHT HANDLE STERIS (MISCELLANEOUS) ×4 IMPLANT
COVER WAND RF STERILE (DRAPES) ×2 IMPLANT
DECANTER SPIKE VIAL GLASS SM (MISCELLANEOUS) ×2 IMPLANT
DERMABOND ADVANCED (GAUZE/BANDAGES/DRESSINGS) ×1
DERMABOND ADVANCED .7 DNX12 (GAUZE/BANDAGES/DRESSINGS) ×1 IMPLANT
ELECT REM PT RETURN 9FT ADLT (ELECTROSURGICAL) ×2
ELECTRODE REM PT RTRN 9FT ADLT (ELECTROSURGICAL) ×1 IMPLANT
FILTER SMOKE EVAC LAPAROSHD (FILTER) ×2 IMPLANT
GLOVE BIO SURGEON STRL SZ 6.5 (GLOVE) ×2 IMPLANT
GLOVE BIO SURGEON STRL SZ8 (GLOVE) ×2 IMPLANT
GLOVE BIOGEL PI IND STRL 6.5 (GLOVE) ×1 IMPLANT
GLOVE BIOGEL PI IND STRL 7.0 (GLOVE) ×3 IMPLANT
GLOVE BIOGEL PI IND STRL 8 (GLOVE) ×1 IMPLANT
GLOVE BIOGEL PI INDICATOR 6.5 (GLOVE) ×1
GLOVE BIOGEL PI INDICATOR 7.0 (GLOVE) ×3
GLOVE BIOGEL PI INDICATOR 8 (GLOVE) ×1
GLOVE ECLIPSE 7.0 STRL STRAW (GLOVE) ×1 IMPLANT
GLOVE ECLIPSE 7.5 STRL STRAW (GLOVE) ×2 IMPLANT
GOWN STRL REUS W/TWL LRG LVL3 (GOWN DISPOSABLE) ×6 IMPLANT
HEMOSTAT SNOW SURGICEL 2X4 (HEMOSTASIS) ×2 IMPLANT
INST SET LAPROSCOPIC AP (KITS) ×2 IMPLANT
IV NS IRRIG 3000ML ARTHROMATIC (IV SOLUTION) IMPLANT
KIT TURNOVER KIT A (KITS) ×2 IMPLANT
MANIFOLD NEPTUNE II (INSTRUMENTS) ×2 IMPLANT
NDL INSUFFLATION 14GA 120MM (NEEDLE) ×1 IMPLANT
NEEDLE INSUFFLATION 14GA 120MM (NEEDLE) ×2 IMPLANT
NS IRRIG 1000ML POUR BTL (IV SOLUTION) ×2 IMPLANT
PACK LAP CHOLE LZT030E (CUSTOM PROCEDURE TRAY) ×2 IMPLANT
PAD ARMBOARD 7.5X6 YLW CONV (MISCELLANEOUS) ×2 IMPLANT
SET BASIN LINEN APH (SET/KITS/TRAYS/PACK) ×2 IMPLANT
SET TUBE IRRIG SUCTION NO TIP (IRRIGATION / IRRIGATOR) IMPLANT
SLEEVE ENDOPATH XCEL 5M (ENDOMECHANICALS) ×2 IMPLANT
SPONGE GAUZE 2X2 8PLY STRL LF (GAUZE/BANDAGES/DRESSINGS) ×1 IMPLANT
STAPLER VISISTAT (STAPLE) ×1 IMPLANT
SUT MNCRL AB 4-0 PS2 18 (SUTURE) ×3 IMPLANT
SUT VICRYL 0 UR6 27IN ABS (SUTURE) ×2 IMPLANT
SYS BAG RETRIEVAL 10MM (BASKET) ×2
SYSTEM BAG RETRIEVAL 10MM (BASKET) ×1 IMPLANT
TAPE CLOTH SURG 4X10 WHT LF (GAUZE/BANDAGES/DRESSINGS) ×1 IMPLANT
TROCAR ENDO BLADELESS 11MM (ENDOMECHANICALS) ×2 IMPLANT
TROCAR XCEL NON-BLD 5MMX100MML (ENDOMECHANICALS) ×2 IMPLANT
TROCAR XCEL UNIV SLVE 11M 100M (ENDOMECHANICALS) ×2 IMPLANT
TUBE CONNECTING 12X1/4 (SUCTIONS) ×2 IMPLANT
TUBING INSUFFLATION (TUBING) ×2 IMPLANT
WARMER LAPAROSCOPE (MISCELLANEOUS) ×2 IMPLANT
YANKAUER SUCT BULB TIP 10FT TU (MISCELLANEOUS) ×2 IMPLANT

## 2018-10-17 NOTE — Interval H&P Note (Signed)
History and Physical Interval Note:  10/17/2018 7:16 AM  [name]  has presented today for surgery, with the diagnosis of cholelithiasis.  The various methods of treatment have been discussed with the patient and family. After consideration of risks, benefits and other options for treatment, the patient has consented to  Procedure(s): LAPAROSCOPIC CHOLECYSTECTOMY (N/A) as a surgical intervention.  The patient's history has been reviewed, patient examined, no change in status, stable for surgery.  I have reviewed the patient's chart and labs.  Questions were answered to the patient's satisfaction.    No changes. No questions.  Virl Cagey

## 2018-10-17 NOTE — Transfer of Care (Signed)
Immediate Anesthesia Transfer of Care Note  Patient: Karen Hoover  Procedure(s) Performed: LAPAROSCOPIC CHOLECYSTECTOMY (N/A Abdomen)  Patient Location: PACU  Anesthesia Type:General  Level of Consciousness: awake, alert , oriented and patient cooperative  Airway & Oxygen Therapy: Patient Spontanous Breathing and Patient connected to face mask oxygen  Post-op Assessment: Report given to RN and Post -op Vital signs reviewed and stable  Post vital signs: Reviewed and stable  Last Vitals:  Vitals Value Taken Time  BP 147/68 10/17/18 0917  Temp    Pulse 108 10/17/18 0919  Resp 30 10/17/18 0919  SpO2 100 % 10/17/18 0919  Vitals shown include unvalidated device data.  Last Pain:  Vitals:   10/17/18 0645  TempSrc: Oral  PainSc: 3       Patients Stated Pain Goal: 7 (48/01/65 5374)  Complications: No apparent anesthesia complications

## 2018-10-17 NOTE — Anesthesia Postprocedure Evaluation (Signed)
Anesthesia Post Note Late Entry 0940  Patient: Karen Hoover  Procedure(s) Performed: LAPAROSCOPIC CHOLECYSTECTOMY (N/A Abdomen)  Patient location during evaluation: PACU Anesthesia Type: General Level of consciousness: awake and alert and oriented Pain management: pain level controlled Vital Signs Assessment: post-procedure vital signs reviewed and stable Respiratory status: spontaneous breathing Cardiovascular status: stable : Some nausea; treated with phenergan. Anesthetic complications: no     Last Vitals:  Vitals:   10/17/18 1000 10/17/18 1008  BP: 134/77 139/82  Pulse: (!) 109 99  Resp: 18 14  Temp:  36.4 C  SpO2: 93% 94%    Last Pain:  Vitals:   10/17/18 1008  TempSrc: Oral  PainSc: 7                  Monteen Toops A

## 2018-10-17 NOTE — Op Note (Signed)
Operative Note   Preoperative Diagnosis: Symptomatic cholelithiasis   Postoperative Diagnosis: Same   Procedure(s) Performed: Laparoscopic cholecystectomy   Surgeon: Ria Comment C. Constance Haw, MD   Assistants: Aviva Signs, MD    Anesthesia: General endotracheal   Anesthesiologist: Lenice Llamas, MD    Specimens: Gallbladder    Estimated Blood Loss: Minimal    Blood Replacement: None    Complications: None    Operative Findings: Gallbladder with stones, spillage of stones that were retrieved after bag ruptured    Procedure: The patient was taken to the operating room and placed supine. General endotracheal anesthesia was induced. Intravenous antibiotics were administered per protocol. An orogastric tube positioned to decompress the stomach. The abdomen was prepared and draped in the usual sterile fashion.    A supraumbilical incision was made and a Veress technique was utilized to achieve pneumoperitoneum to 15 mmHg with carbon dioxide. A 11 mm optiview port was placed through the supraumbilical region, and a 10 mm 0-degree operative laparoscope was introduced. The area underlying the trocar and Veress needle were inspected and without evidence of injury.  Remaining trocars were placed under direct vision. Two 5 mm ports were placed in the right abdomen, between the anterior axillary and midclavicular line.  A final 11 mm port was placed through the mid-epigastrium, near the falciform ligament.    The gallbladder fundus was elevated cephalad and the infundibulum was retracted to the patient's right.  The gallbladder was very intrahepatic and there was a limit to the elevation due to this and the large liver.  The gallbladder had to be taken down in a dome down approach in order to it freed from the hepatic bed and elevate the gallbladder/cystic duct junction, which then could be skeletonized. The cystic artery noted in the triangle of Calot and was also skeletonized. After complete dome  down and mobilization the gallbladder was on a pedicle with the cystic duct going into the gallbladder and a cystic artery going into the gallbladder. The critical view of safety was achieved.    The cystic duct was doubly clipped and divided. The cystic artery was doubly clipped but there seemed to be a posterior branch that was also clipped in the minimally remaining mesentery as there was some oozing.  The gallbladder was fully dissected from the bed at this point.   The specimen was placed in an Endopouch.   Final inspection revealed acceptable hemostasis. Surgical Emogene Morgan was placed in the gallbladder bed.  The Endopouch was attempted to be retrieved through the epigastric site but due to the stones and the patient's body habitus there was some difficulty. The bag ruptured and stones did fall out of the gallbladder and bag.  The stones were carefully retrieved and the liver bed and surrounding omentum was carefully inspected and suctioned. Trocars were removed and pneumoperitoneum was released. The epigastric site was inspected and irrigated ensuring no stones were in the subcutaneous tissue. A 0 Vicryl fascial sutures were used to close the epigastric port site. The umbilical site was smaller than the tip of my finger. Due to the spillage of stones, I closed the skin with staples in order to allow for any drainage and due to the increased risk of infection.  The patient was awakened from anesthesia and extubated without complication.    Curlene Labrum, MD [facility] [address] [place], [place] [address] [phone] (office)

## 2018-10-17 NOTE — Anesthesia Preprocedure Evaluation (Signed)
Anesthesia Evaluation  Patient identified by MRN, date of birth, ID band Patient awake  General Assessment Comment:Reports h/o Aspiration with back surgery in 2017. Hospitalized for ~2days after  Denies any other problems with anesthesia or FH of issues   Reviewed: Allergy & Precautions, NPO status , Patient's Chart, lab work & pertinent test results  History of Anesthesia Complications (+) history of anesthetic complications  Airway Mallampati: I  TM Distance: >3 FB Neck ROM: Full  Mouth opening: Limited Mouth Opening  Dental no notable dental hx. (+) Teeth Intact   Pulmonary sleep apnea and Continuous Positive Airway Pressure Ventilation ,    Pulmonary exam normal breath sounds clear to auscultation       Cardiovascular Exercise Tolerance: Good negative cardio ROS Normal cardiovascular examI Rhythm:Regular Rate:Normal     Neuro/Psych Anxiety negative neurological ROS  negative psych ROS   GI/Hepatic negative GI ROS, Neg liver ROS,   Endo/Other  diabetes, Poorly Controlled, Type 2, Oral Hypoglycemic AgentsHypothyroidism Morbid obesity  Renal/GU negative Renal ROS  negative genitourinary   Musculoskeletal negative musculoskeletal ROS (+)   Abdominal   Peds negative pediatric ROS (+)  Hematology negative hematology ROS (+)   Anesthesia Other Findings   Reproductive/Obstetrics negative OB ROS                             Anesthesia Physical Anesthesia Plan  ASA: III  Anesthesia Plan: General   Post-op Pain Management:    Induction: Intravenous  PONV Risk Score and Plan: 3 and Dexamethasone, Ondansetron, Treatment may vary due to age or medical condition and Midazolam  Airway Management Planned: Oral ETT  Additional Equipment:   Intra-op Plan:   Post-operative Plan: Extubation in OR  Informed Consent: I have reviewed the patients History and Physical, chart, labs and  discussed the procedure including the risks, benefits and alternatives for the proposed anesthesia with the patient or authorized representative who has indicated his/her understanding and acceptance.     Dental advisory given  Plan Discussed with: CRNA  Anesthesia Plan Comments: (WBC > 13, Dr. Constance Haw aware -WTP Plan Full PPE use Plan GETA D/W PT -WTP with same after Q&A)        Anesthesia Quick Evaluation

## 2018-10-17 NOTE — Discharge Instructions (Signed)
Discharge Laparoscopic Surgery Instructions:  Common Complaints: The bag we use to remove the gallbladder tore during the surgery, and some stones spilled out. These were retrieved, but this puts you at a slightly increased risk of a wound infection. This is why staples were used. Please monitor the staple sites for any redness or purulent drainage.  Right shoulder pain is common after laparoscopic surgery. This is secondary to the gas used in the surgery being trapped under the diaphragm.  Walk to help your body absorb the gas. This will improve in a few days. Pain at the port sites are common, especially the larger port sites. This will improve with time.  Some nausea is common and poor appetite. The main goal is to stay hydrated the first few days after surgery.    Diet/ Activity: Diet as tolerated. You may not have an appetite, but it is important to stay hydrated. Drink 64 ounces of water a day. Your appetite will return with time.  Shower per your regular routine daily.   Rest and listen to your body, but do not remain in bed all day.  Walk everyday for at least 15-20 minutes. Deep cough and move around every 1-2 hours in the first few days after surgery.  Do not lift > 10 lbs, perform excessive bending, pushing, pulling, squatting for 1-2 weeks after surgery.  Do not pick at the staples. You can replace the bandages if needed.  Remove the bandages after 48 hours and replace as needed.  Monitor your port sites for any signs of redness or drainage. You may how some drainage that is pinkish in nature.  Notify us if the drainage turns purulent.   Do not place lotions or balms on your incision unless instructed to specifically by Dr. Henreitta LeberBridges.   Medication: Take tylenol and ibuprofen as needed for pain control, alternating every 4-6 hours.  Example:  Tylenol 1000mg  @ 6am, 12noon, 6pm, 12midnight (Do not exceed 4000mg  of tylenol a day). Ibuprofen 800mg  @ 9am, 3pm, 9pm, 3am (Do not exceed  3600mg  of ibuprofen a day).  Take Roxicodone for breakthrough pain every 4 hours.  Take Colace for constipation related to narcotic pain medication. If you do not have a bowel movement in 2 days, take Miralax over the counter.  Drink plenty of water to also prevent constipation.   Contact Information: If you have questions or concerns, please call our office, [phone], Monday- Thursday 8AM-5PM and Friday 8AM-12Noon.  If it is after hours or on the weekend, please call [facility], [phone], and ask to speak to the surgeon on call for Dr. Henreitta LeberBridges at [facility].    Laparoscopic Cholecystectomy, Care After This sheet gives you information about how to care for yourself after your procedure. Your doctor may also give you more specific instructions. If you have problems or questions, contact your doctor. Follow these instructions at home: Care for cuts from surgery (incisions)   Follow instructions from your doctor about how to take care of your cuts from surgery. Make sure you: ? Wash your hands with soap and water before you change your bandage (dressing). If you cannot use soap and water, use hand sanitizer. ? Change your bandage as told by your doctor. ? Leave stitches (sutures), skin glue, or skin tape (adhesive) strips in place. They may need to stay in place for 2 weeks or longer. If tape strips get loose and curl up, you may trim the loose edges. Do not remove tape strips completely  unless your doctor says it is okay.  Do not take baths, swim, or use a hot tub until your doctor says it is okay.   You may shower.   Check your surgical cut area every day for signs of infection. Check for: ? More redness, swelling, or pain. ? More fluid or blood. ? Warmth. ? Pus or a bad smell. Activity  Do not drive or use heavy machinery while taking prescription pain medicine.  Do not lift anything that is heavier than 10 lb (4.5 kg) until your doctor says it is okay.  Do not  play contact sports until your doctor says it is okay.  Do not drive for 24 hours if you were given a medicine to help you relax (sedative).  Rest as needed. Do not return to work or school until your doctor says it is okay. General instructions  Take over-the-counter and prescription medicines only as told by your doctor.  To prevent or treat constipation while you are taking prescription pain medicine, your doctor may recommend that you: ? Drink enough fluid to keep your pee (urine) clear or pale yellow. ? Take over-the-counter or prescription medicines. ? Eat foods that are high in fiber, such as fresh fruits and vegetables, whole grains, and beans. ? Limit foods that are high in fat and processed sugars, such as fried and sweet foods. Contact a doctor if:  You develop a rash.  You have more redness, swelling, or pain around your surgical cuts.  You have more fluid or blood coming from your surgical cuts.  Your surgical cuts feel warm to the touch.  You have pus or a bad smell coming from your surgical cuts.  You have a fever.  One or more of your surgical cuts breaks open. Get help right away if:  You have trouble breathing.  You have chest pain.  You have pain that is getting worse in your shoulders.  You faint or feel dizzy when you stand.  You have very bad pain in your belly (abdomen).  You are sick to your stomach (nauseous) for more than one day.  You have throwing up (vomiting) that lasts for more than one day.  You have leg pain. This information is not intended to replace advice given to you by your health care provider. Make sure you discuss any questions you have with your health care provider. Document Released: 11/03/2007 Document Revised: 01/06/2017 Document Reviewed: 07/13/2015 Elsevier Patient Education  2020 [employer]  General Anesthesia, Adult, Care After This sheet gives you information about how to care for yourself after your procedure.  Your health care provider may also give you more specific instructions. If you have problems or questions, contact your health care provider. What can I expect after the procedure? After the procedure, the following side effects are common:  Pain or discomfort at the IV site.  Nausea.  Vomiting.  Sore throat.  Trouble concentrating.  Feeling cold or chills.  Weak or tired.  Sleepiness and fatigue.  Soreness and body aches. These side effects can affect parts of the body that were not involved in surgery. Follow these instructions at home:  For at least 24 hours after the procedure:  Have a responsible adult stay with you. It is important to have someone help care for you until you are awake and alert.  Rest as needed.  Do not: ? Participate in activities in which you could fall or become injured. ? Drive. ? Use heavy machinery. ?  Drink alcohol. ? Take sleeping pills or medicines that cause drowsiness. ? Make important decisions or sign legal documents. ? Take care of children on your own. Eating and drinking  Follow any instructions from your health care provider about eating or drinking restrictions.  When you feel hungry, start by eating small amounts of foods that are soft and easy to digest (bland), such as toast. Gradually return to your regular diet.  Drink enough fluid to keep your urine pale yellow.  If you vomit, rehydrate by drinking water, juice, or clear broth. General instructions  If you have sleep apnea, surgery and certain medicines can increase your risk for breathing problems. Follow instructions from your health care provider about wearing your sleep device: ? Anytime you are sleeping, including during daytime naps. ? While taking prescription pain medicines, sleeping medicines, or medicines that make you drowsy.  Return to your normal activities as told by your health care provider. Ask your health care provider what activities are safe for  you.  Take over-the-counter and prescription medicines only as told by your health care provider.  If you smoke, do not smoke without supervision.  Keep all follow-up visits as told by your health care provider. This is important. Contact a health care provider if:  You have nausea or vomiting that does not get better with medicine.  You cannot eat or drink without vomiting.  You have pain that does not get better with medicine.  You are unable to pass urine.  You develop a skin rash.  You have a fever.  You have redness around your IV site that gets worse. Get help right away if:  You have difficulty breathing.  You have chest pain.  You have blood in your urine or stool, or you vomit blood. Summary  After the procedure, it is common to have a sore throat or nausea. It is also common to feel tired.  Have a responsible adult stay with you for the first 24 hours after general anesthesia. It is important to have someone help care for you until you are awake and alert.  When you feel hungry, start by eating small amounts of foods that are soft and easy to digest (bland), such as toast. Gradually return to your regular diet.  Drink enough fluid to keep your urine pale yellow.  Return to your normal activities as told by your health care provider. Ask your health care provider what activities are safe for you. This information is not intended to replace advice given to you by your health care provider. Make sure you discuss any questions you have with your health care provider. Document Released: 05/02/2000 Document Revised: 01/27/2017 Document Reviewed: 09/09/2016 Elsevier Patient Education  2020 [employer].

## 2018-10-17 NOTE — Anesthesia Procedure Notes (Signed)
Procedure Name: Intubation Date/Time: 10/17/2018 7:34 AM Performed by: Andree Elk, Amy A, CRNA Pre-anesthesia Checklist: Patient identified, Patient being monitored, Timeout performed, Emergency Drugs available and Suction available Patient Re-evaluated:Patient Re-evaluated prior to induction Oxygen Delivery Method: Circle system utilized Preoxygenation: Pre-oxygenation with 100% oxygen Induction Type: IV induction, Rapid sequence and Cricoid Pressure applied Laryngoscope Size: 3 and Glidescope Grade View: Grade I Tube type: Oral Tube size: 7.0 mm Number of attempts: 1 Airway Equipment and Method: Stylet and Video-laryngoscopy Placement Confirmation: ETT inserted through vocal cords under direct vision,  positive ETCO2 and breath sounds checked- equal and bilateral Secured at: 21 cm Tube secured with: Tape Dental Injury: Teeth and Oropharynx as per pre-operative assessment

## 2018-10-18 ENCOUNTER — Encounter (HOSPITAL_COMMUNITY): Payer: Self-pay | Admitting: General Surgery

## 2018-10-24 MED FILL — ESCITALOPRAM 20 MG TABLET: 20 | 30 days supply | Qty: 30 | Fill #0

## 2018-10-24 MED FILL — LEVOTHYROXINE 75 MCG TABLET: 75 | 30 days supply | Qty: 30 | Fill #0

## 2018-10-25 ENCOUNTER — Other Ambulatory Visit: Payer: Self-pay

## 2018-10-25 ENCOUNTER — Ambulatory Visit (INDEPENDENT_AMBULATORY_CARE_PROVIDER_SITE_OTHER): Payer: Self-pay | Admitting: General Surgery

## 2018-10-25 ENCOUNTER — Encounter: Payer: Self-pay | Admitting: General Surgery

## 2018-10-25 VITALS — BP 123/80 | HR 75 | Temp 96.8°F | Resp 16 | Ht 64.0 in | Wt 260.0 lb

## 2018-10-25 DIAGNOSIS — K8010 Calculus of gallbladder with chronic cholecystitis without obstruction: Secondary | ICD-10-CM

## 2018-10-25 NOTE — Patient Instructions (Addendum)
Diet and activity as tolerated. Ok to return to work after 2 weeks 10/31/2018. Ok to drive if not taking pain medication and can slam on breaks/ turn the wheel.

## 2018-10-25 NOTE — Progress Notes (Signed)
[facility] Note   HPI:  28 y.o. Female presents to clinic for post-op follow-up evaluation after a laparoscopic cholecystectomy. she is doing well overall. She is eating and drinking and having good pain control.   Review of Systems:  No drainage or extending redness at sites All other review of systems: otherwise negative   Vital Signs:  BP 123/80 (BP Location: Left Arm, Patient Position: Sitting, Cuff Size: Large)   Pulse 75   Temp (!) 96.8 F (36 C) (Tympanic)   Resp 16   Ht 5\' 4"  (1.626 m)   Wt 260 lb (117.9 kg)   SpO2 97%   BMI 44.63 kg/m    Physical Exam:  Physical Exam Vitals signs reviewed.  Cardiovascular:     Rate and Rhythm: Normal rate.  Pulmonary:     Effort: Pulmonary effort is normal.  Abdominal:     General: There is no distension.     Palpations: Abdomen is soft.     Tenderness: There is no abdominal tenderness.     Hernia: No hernia is present.     Comments: Healing port sites, staples removed, minor redness at staple line but not extending out, removed staples and steri strips placed  Neurological:     Mental Status: She is alert.    Pathology: Diagnosis Gallbladder - CHRONIC CHOLECYSTITIS AND CHOLELITHIASIS - BENIGN LYMPH NODE  Assessment:  28 y.o. yo Female with chronic cholecystitis s/p cholecystectomy. Doing well. The bag ruptured and stones spilled and had to be retrieved so staples were used and were removed now. No signs of any skin infections.   Plan:  Diet and activity as tolerated. Ok to return to work after 2 weeks 10/31/2018. Ok to drive if not taking pain medication and can slam on breaks/ turn the wheel.   All of the above recommendations were discussed with the patient, and all of patient's questions were answered to her expressed satisfaction.  Curlene Labrum, MD [facility] [address] [place], [place] [address] [phone] (office)

## 2018-10-31 ENCOUNTER — Other Ambulatory Visit: Payer: Self-pay

## 2018-10-31 ENCOUNTER — Ambulatory Visit: Payer: 59

## 2018-10-31 DIAGNOSIS — M5440 Lumbago with sciatica, unspecified side: Secondary | ICD-10-CM | POA: Diagnosis not present

## 2018-10-31 DIAGNOSIS — R293 Abnormal posture: Secondary | ICD-10-CM

## 2018-10-31 DIAGNOSIS — G8929 Other chronic pain: Secondary | ICD-10-CM | POA: Diagnosis not present

## 2018-10-31 DIAGNOSIS — M62838 Other muscle spasm: Secondary | ICD-10-CM | POA: Diagnosis not present

## 2018-10-31 NOTE — Patient Instructions (Signed)
Use your hands at your scar to provide constant tension up toward your head to allow the scar tissue to stretch for ~ 3 min. Per night for 1-2 weeks.   Look into getting coconut oil to rub on the scar and help keep it moisturized while also  Having an antibacterial property.   Only avoid laying on the stomach for hip EXT and planks for another 1-2 weeks or until it does not increase your pain.

## 2018-10-31 NOTE — Therapy (Signed)
[place] MAIN [facility] SERVICES [address] [place], [place], [address] Phone: [phone]   Fax:  [phone]  Physical Therapy Treatment  The patient has been informed of current processes in place at Outpatient Rehab to protect patients from Covid-19 exposure including social distancing, schedule modifications, and new cleaning procedures. After discussing their particular risk with a therapist based on the patient's personal risk factors, the patient has decided to proceed with in-person therapy.   Patient Details  Name: Karen Hoover MRN: [id number] Date of Birth: 02-23-1990 Referring Provider (PT): Erline Levine   Encounter Date: 10/31/2018  PT End of Session - 10/31/18 1738    Visit Number  5    Number of Visits  10    Date for PT Re-Evaluation  11/14/18    Authorization Type  [facility]    Authorization Time Period  through 11/14/2018    Authorization - Visit Number  5    Authorization - Number of Visits  10   clinical eligibility required after 25th visit   PT Start Time  0938    PT Stop Time  1735    PT Time Calculation (min)  60 min    Activity Tolerance  Patient tolerated treatment well;No increased pain    Behavior During Therapy  WFL for tasks assessed/performed       Past Medical History:  Diagnosis Date  . Anemia   . Anxiety 03/17/2015  . BV (bacterial vaginosis) 04/10/2014  . Chronic lower back pain    2 herniated disc sees Dr Carloyn Manner  . Complication of anesthesia    O2 desaturation, Hypotension, Fluid filled lungs  . Diabetes mellitus without complication ([place])   . Encounter for menstrual regulation 03/13/2014  . Family history of breast cancer   . Family history of melanoma   . Fatty liver 09/05/2018  . Gallstones 09/05/2018   Refer to Dr Constance Haw  . Hemorrhoids 01/12/2015  . History of rectal bleeding 01/12/2015  . Hypothyroidism   . IC (interstitial cystitis) 01/22/2015  . Pulmonary edema 08/21/2015   S/P back OR   . Sleep apnea   . Thyroid disease   . Vaginal discharge 03/13/2014  . Vaginal itching 01/12/2015  . Vestibulitis, vulvar 04/21/2014  . Yeast infection 03/13/2014    Past Surgical History:  Procedure Laterality Date  . BACK SURGERY    . CHOLECYSTECTOMY N/A 10/17/2018   Procedure: LAPAROSCOPIC CHOLECYSTECTOMY;  Surgeon: Virl Cagey, MD;  Location: [facility];  Service: General;  Laterality: N/A;  . LAMINECTOMY AND MICRODISCECTOMY LUMBAR SPINE Right 08/21/2015   L4-5 and L5-S 1    There were no vitals filed for this visit.   Pelvic Floor Physical Therapy Treatment Note  SCREENING  Changes in medications, allergies, or medical history?: none  SUBJECTIVE  Patient reports: Is feeling pretty good after her gallbladder surgery. Has some scars from bigger than normal incision due to gallbladder bursting during the procedure. Has done kegels only since the surgery. Having less leakage throughout the day, still needing one panty-liner but it is damp rather than saturated.  Precautions:  none  Pain update:  Location of pain: low back Current pain:  0/10  Max pain:  5/10 Least pain:  0/10 Nature of pain: dull ache  Patient Goals: Decrease leakage, not have pain with intercourse or at rest.   OBJECTIVE  Changes in: Posture/Observations:  Decreased scar mobility in cephalic direction, no pain.  Palpation: TTP to L>R Iliacus and Psoas  INTERVENTIONS THIS SESSION: Manual: Performed TP release and STM to B Iliacus to decrease spasm and pain and allow for improved balance of musculature for improved function and decreased symptoms. Dry-needle: Performed TPDN with .30x27m needle to B iliacus to decrease spasm and pain and allow for improved balance of musculature for improved function and decreased symptoms. Theract: Reviewed and updated goals to reflect changes after gallbladder surgery, educated Pt. On how to perform self scar mobility to prevent tethering and pain. Reviewed  HEP for appropriateness in recovery phase.     Total time: 60 min.                           PT Short Term Goals - 10/31/18 1645      PT SHORT TERM GOAL #1   Title  Patient will demonstrate improved pelvic alignment and balance of musculature surrounding the pelvis to facilitate decreased PFM spasms and decrease pelvic pain.    Baseline  LLE long by 3 cm, spasms surrounding    Time  5    Period  Weeks    Status  Achieved    Target Date  10/10/18      PT SHORT TERM GOAL #2   Title  Patient will report a reduction in pain to no greater than 5/10 over the prior week to demonstrate symptom improvement.    Baseline  Max pain of 10/10    Time  5    Period  Weeks    Status  Achieved    Target Date  10/10/18      PT SHORT TERM GOAL #3   Title  Patient will demonstrate HEP x1 in the clinic to demonstrate understanding and proper form to allow for further improvement.    Baseline  Pt. lacks knowledge of the therepeutic exercises that will help her decrease her pain and incontinence.    Time  5    Period  Weeks    Status  Achieved    Target Date  10/10/18        PT Long Term Goals - 10/31/18 1646      PT LONG TERM GOAL #1   Title  Patient will report no episodes of SUI/UUI over the course of the prior two weeks to demonstrate improved functional ability.    Baseline  Having leakage all day, wearing 1 pantyliner per day    Time  10    Period  Weeks    Status  On-going    Target Date  11/14/18      PT LONG TERM GOAL #2   Title  Patient will report no pain with intercourse to demonstrate improved functional ability.    Baseline  Pt. having pain with all intercourse    Time  10    Period  Weeks    Status  On-going    Target Date  11/14/18      PT LONG TERM GOAL #3   Title  Patient will score at or below 60/300 on the PFDI and 21/300 on the PFIQ to demonstrate a clinically meaningful decrease in disability and distress due to pelvic floor dysfunction.     Baseline  PFDI: 119/300, PFIQ: 42/300    Time  10    Period  Weeks    Status  On-going    Target Date  11/14/18      PT LONG TERM GOAL #4   Title  Patient will score less than or equal  to 20% on the Female NIH-CPSI to demonstrate a reduction in pain, urinary symptoms, and an improved quality of life.    Baseline  Female NIH-CPSI: 23/43 (53%)    Time  10    Period  Weeks    Status  New    Target Date  11/14/18      PT LONG TERM GOAL #5   Title  Patient will describe pain no greater than 2/10 during work, exercise, and ADL's to demonstrate improved functional ability.    Baseline  10/10 max pain, 2/10 at best    Time  10    Period  Weeks    Status  New    Target Date  11/14/18            Plan - 10/31/18 1739    Clinical Impression Statement Pt. Responded well to all interventions today, demonstrating decreased spasm and tenderness as well as understanding and correct performance of all education and exercises provided today. They will continue to benefit from skilled physical therapy to work toward remaining goals and maximize function as well as decrease likelihood of symptom increase or recurrence.    Personal Factors and Comorbidities  Comorbidity 3+;Fitness    Comorbidities  Anxiety, Depression, Diabetes, Prior laminectomy at L4-5-S1    Examination-Activity Limitations  Lift;Squat;Stand;Continence;Sit    Examination-Participation Restrictions  Interpersonal Relationship    Stability/Clinical Decision Making  Unstable/Unpredictable    Rehab Potential  Good    PT Frequency  1x / week    PT Duration  Other (comment)   10 weeks   PT Treatment/Interventions  ADLs/Self Care Home Management;Electrical Stimulation;Moist Heat;Functional mobility training;Therapeutic activities;Therapeutic exercise;Neuromuscular re-education;Patient/family education;Manual techniques;Scar mobilization;Passive range of motion;Dry needling;Taping;Spinal Manipulations;Joint  Manipulations;Biofeedback;Ultrasound;Traction;Orthotic Fit/Training    PT Next Visit Plan  review/expand posture education, HEP for upper crossed (re-check L Iliacus and Psoas for spasm)    PT Home Exercise Plan  posterior pelvic tilts, hip-flexor stretch, counter-top plank, hip ABD/EXT in lying, LB stretch in standing, supine butterfly.    Consulted and Agree with Plan of Care  Patient       Patient will benefit from skilled therapeutic intervention in order to improve the following deficits and impairments:  Decreased endurance, Decreased range of motion, Decreased strength, Hypomobility, Increased fascial restricitons, Increased muscle spasms, Impaired flexibility, Improper body mechanics, Postural dysfunction, Obesity, Pain, Abnormal gait, Decreased coordination, Decreased activity tolerance  Visit Diagnosis: Abnormal posture  Other muscle spasm  Chronic bilateral low back pain with sciatica, sciatica laterality unspecified     Problem List Patient Active Problem List   Diagnosis Date Noted  . Calculus of gallbladder without cholecystitis without obstruction 09/05/2018  . Fatty liver 09/05/2018  . Nausea 08/30/2018  . RUQ pain 08/30/2018  . Pelvic pain 08/30/2018  . Pain with urination 08/30/2018  . 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 Family history of breast cancer   . Family history of melanoma   . Urge incontinence 03/23/2016  . Diarrhea 12/16/2015  . Change in bowel habits 12/16/2015  . Generalized abdominal pain 12/16/2015  . Rectal bleeding 12/16/2015  . Pulmonary edema 08/21/2015  . Anxiety 03/17/2015  . IC (interstitial cystitis) 01/22/2015  . Vaginal itching 01/12/2015  . Hemorrhoids 01/12/2015  . History of rectal bleeding 01/12/2015  . Vestibulitis, vulvar 04/21/2014  . BV (bacterial vaginosis) 04/10/2014  . Vaginal discharge 03/13/2014  . Yeast infection 03/13/2014  . Encounter for menstrual regulation 03/13/2014   Keeli  T. Gailes DPT, ATC Keeli   T Gailes 10/31/2018, 5:46 PM  [place] [facility] MA[facility] REHAB SERVICES [address] [place], [place], [address] Phone: [phone]   Fax:  [phone]  Name: Karen Hoover MRN: [id number] Date of Birth: 11/25/1990   

## 2018-11-01 ENCOUNTER — Ambulatory Visit: Payer: 59

## 2018-11-05 ENCOUNTER — Ambulatory Visit: Payer: 59

## 2018-11-07 DIAGNOSIS — E1169 Type 2 diabetes mellitus with other specified complication: Secondary | ICD-10-CM | POA: Diagnosis not present

## 2018-11-22 MED FILL — metFORMIN HCL ER 500 MG TB2: 500 | 30 days supply | Qty: 120 | Fill #1

## 2018-11-22 MED FILL — ESCITALOPRAM 20 MG TABLET: 20 | 30 days supply | Qty: 30 | Fill #1

## 2018-11-23 MED FILL — LEVOTHYROXINE 75 MCG TABLET: 75 | 90 days supply | Qty: 90 | Fill #0

## 2018-12-17 DIAGNOSIS — G4733 Obstructive sleep apnea (adult) (pediatric): Secondary | ICD-10-CM | POA: Diagnosis not present

## 2018-12-21 MED FILL — BLISOVI 24 FE 1-20 MG-MCG(2: 1-20 | 84 days supply | Qty: 84 | Fill #1

## 2018-12-21 MED FILL — ESCITALOPRAM 20 MG TABLET: 20 | 30 days supply | Qty: 30 | Fill #0

## 2019-01-22 ENCOUNTER — Telehealth: Payer: Self-pay

## 2019-01-22 NOTE — Telephone Encounter (Signed)
Unable to get in contact with the patient. LVM asking her to call our office and r/s her appt due to inclement weather. Office number was provided.

## 2019-01-23 ENCOUNTER — Ambulatory Visit: Payer: 59 | Admitting: Adult Health

## 2019-01-28 MED FILL — ESCITALOPRAM 20 MG TABLET: 20 | 30 days supply | Qty: 30 | Fill #1

## 2019-01-28 MED FILL — METFORMIN HCL ER 500 MG TB2: 500 | 30 days supply | Qty: 120 | Fill #2

## 2019-01-29 ENCOUNTER — Ambulatory Visit (INDEPENDENT_AMBULATORY_CARE_PROVIDER_SITE_OTHER): Payer: 59 | Admitting: Adult Health

## 2019-01-29 ENCOUNTER — Encounter: Payer: Self-pay | Admitting: Adult Health

## 2019-01-29 ENCOUNTER — Other Ambulatory Visit: Payer: Self-pay

## 2019-01-29 VITALS — BP 125/75 | HR 79 | Temp 97.2°F | Ht 65.0 in | Wt 259.4 lb

## 2019-01-29 DIAGNOSIS — R102 Pelvic and perineal pain: Secondary | ICD-10-CM

## 2019-01-29 DIAGNOSIS — G4733 Obstructive sleep apnea (adult) (pediatric): Secondary | ICD-10-CM

## 2019-01-29 DIAGNOSIS — Z9989 Dependence on other enabling machines and devices: Secondary | ICD-10-CM

## 2019-01-29 NOTE — Patient Instructions (Signed)
Continue using CPAP nightly and greater than 4 hours each night.  °Mask refitting °If your symptoms worsen or you develop new symptoms please let us know.  ° °

## 2019-01-29 NOTE — Progress Notes (Signed)
PATIENT: Karen Hoover DOB: 09/25/1990  REASON FOR VISIT: follow up HISTORY FROM: patient  HISTORY OF PRESENT ILLNESS: Today 01/29/19:  Karen Hoover is a 28 year old female with a history of obstructive sleep apnea on CPAP.  Her download indicates that she use her machine 21 out of 30 days for compliance of 70%.  She is using her machine greater than 4 hours for compliance of only 16.7%.  On average she uses her machine 1 hour and 48 minutes.  Her residual AHI is 4.9 on 9 cm of water.  She states that she continues to pull the machine off at night unknowingly.  She also has realized that she is a mouth breather.  She is curious if she needs a different type of mask.  HISTORY :  07/17/18:  Karen Hoover is a 28 year old female with a history of obstructive sleep apnea on CPAP.  She returns today for virtual visit.  Her CPAP download indicates that she use her machine 26 out of 30 days for compliance of 86.7%.  She used her machine greater than 4 hours for compliance of 43.3%.  On average she uses her machine 3 hours and 48 minutes.  Her residual AHI is 3.9 on 9 cm of water.  She states that night she tends to take off the mask while she is sleeping unknowingly.  She is currently using the nasal pillows.  She does like the mask.  She does not feel it leaking.  She joins me today for virtual visit  REVIEW OF SYSTEMS: Out of a complete 14 system review of symptoms, the patient complains only of the following symptoms, and all other reviewed systems are negative.  Epworth sleepiness score 11 Fatigue severity score 42  ALLERGIES: No Known Allergies  HOME MEDICATIONS: Outpatient Medications Prior to Visit  Medication Sig Dispense Refill  . aspirin EC 81 MG tablet Take 81 mg by mouth daily.    Marland Kitchen escitalopram (LEXAPRO) 20 MG tablet TAKE 1 TABLET BY MOUTH ONCE DAILY (Patient taking differently: Take 20 mg by mouth daily. ) 30 tablet 12  . fluticasone (FLONASE) 50 MCG/ACT nasal spray Place  2 sprays into both nostrils daily as needed for allergies.   0  . gabapentin (NEURONTIN) 300 MG capsule Take 300 mg by mouth daily as needed (pain).    . Ibuprofen 200 MG CAPS Take 800 mg by mouth every 8 (eight) hours as needed for moderate pain.     Marland Kitchen levothyroxine (SYNTHROID) 75 MCG tablet Take 75 mcg by mouth daily before breakfast.     . metFORMIN (GLUCOPHAGE) 500 MG tablet Take 1,000 mg by mouth 2 (two) times daily with a meal.    . methocarbamol (ROBAXIN) 500 MG tablet Take 1 tablet (500 mg total) by mouth 3 (three) times daily between meals as needed. 20 tablet 0  . Multiple Vitamin (MULTIVITAMIN WITH MINERALS) TABS tablet Take 1 tablet by mouth daily. OTC    . ondansetron (ZOFRAN) 4 MG tablet Take 1 tablet (4 mg total) by mouth daily as needed for nausea or vomiting. 30 tablet 1  . oxyCODONE (ROXICODONE) 5 MG immediate release tablet Take 1 tablet (5 mg total) by mouth every 4 (four) hours as needed for severe pain or breakthrough pain. 30 tablet 0  . traMADol (ULTRAM) 50 MG tablet Take 1 tablet (50 mg total) by mouth every 6 (six) hours as needed. 6 tablet 0  . Zinc Sulfate (ZINC 15 PO) Take by mouth. OTC    .  BLISOVI 24 FE 1-20 MG-MCG(24) tablet TAKE 1 TABLET BY MOUTH ONCE DAILY (Patient taking differently: Take 1 tablet by mouth daily. ) 28 tablet 11  . docusate sodium (COLACE) 100 MG capsule Take 1 capsule (100 mg total) by mouth 2 (two) times daily. (Patient not taking: Reported on 01/29/2019) 60 capsule 2   No facility-administered medications prior to visit.    PAST MEDICAL HISTORY: Past Medical History:  Diagnosis Date  . Anemia   . Anxiety 03/17/2015  . BV (bacterial vaginosis) 04/10/2014  . Chronic lower back pain    2 herniated disc sees Dr Carloyn Manner  . Complication of anesthesia    O2 desaturation, Hypotension, Fluid filled lungs  . Diabetes mellitus without complication ([place])   . Encounter for menstrual regulation 03/13/2014  . Family history of breast cancer   . Family  history of melanoma   . Fatty liver 09/05/2018  . Gallstones 09/05/2018   Refer to Dr Constance Haw  . Hemorrhoids 01/12/2015  . History of rectal bleeding 01/12/2015  . Hypothyroidism   . IC (interstitial cystitis) 01/22/2015  . Pulmonary edema 08/21/2015   S/P back OR  . Sleep apnea   . Thyroid disease   . Vaginal discharge 03/13/2014  . Vaginal itching 01/12/2015  . Vestibulitis, vulvar 04/21/2014  . Yeast infection 03/13/2014    PAST SURGICAL HISTORY: Past Surgical History:  Procedure Laterality Date  . BACK SURGERY    . CHOLECYSTECTOMY N/A 10/17/2018   Procedure: LAPAROSCOPIC CHOLECYSTECTOMY;  Surgeon: Virl Cagey, MD;  Location: [facility];  Service: General;  Laterality: N/A;  . LAMINECTOMY AND MICRODISCECTOMY LUMBAR SPINE Right 08/21/2015   L4-5 and L5-S 1    FAMILY HISTORY: Family History  Problem Relation Age of Onset  . Diabetes Mother   . Thyroid disease Mother   . Breast cancer Mother 70       CHEK2 pos  . Thyroid disease Brother   . Diabetes Maternal Grandmother   . Melanoma Maternal Grandmother        d. 7  . Cancer Paternal Grandmother   . Melanoma Maternal Grandfather        d. 84  . Kidney cancer Other        MGF's sister  . Breast cancer Cousin 62       mother's pat first cousin    SOCIAL HISTORY: Social History   Socioeconomic History  . Marital status: Single    Spouse name: Not on file  . Number of children: Not on file  . Years of education: Not on file  . Highest education level: Not on file  Occupational History  . Occupation: Surgical Occupational hygienist: [employer]  Tobacco Use  . Smoking status: Never Smoker  . Smokeless tobacco: Never Used  Substance and Sexual Activity  . Alcohol use: Yes    Comment: occasionally   . Drug use: No  . Sexual activity: Not Currently    Birth control/protection: Pill  Other Topics Concern  . Not on file  Social History Narrative  . Not on file   Social Determinants of Health    Financial Resource Strain:   . Difficulty of Paying Living Expenses: Not on file  Food Insecurity:   . Worried About Charity fundraiser in the Last Year: Not on file  . Ran Out of Food in the Last Year: Not on file  Transportation Needs:   . Lack of Transportation (Medical): Not on file  .  Lack of Transportation (Non-Medical): Not on file  Physical Activity:   . Days of Exercise per Week: Not on file  . Minutes of Exercise per Session: Not on file  Stress:   . Feeling of Stress : Not on file  Social Connections:   . Frequency of Communication with Friends and Family: Not on file  . Frequency of Social Gatherings with Friends and Family: Not on file  . Attends Religious Services: Not on file  . Active Member of Clubs or Organizations: Not on file  . Attends Archivist Meetings: Not on file  . Marital Status: Not on file  Intimate Partner Violence:   . Fear of Current or Ex-Partner: Not on file  . Emotionally Abused: Not on file  . Physically Abused: Not on file  . Sexually Abused: Not on file      PHYSICAL EXAM  Vitals:   01/29/19 1111  BP: 125/75  Pulse: 79  Temp: (!) 97.2 F (36.2 C)  Weight: 259 lb 6.4 oz (117.7 kg)  Height: 5' 5"  (1.651 m)   Body mass index is 43.17 kg/m.  Generalized: Well developed, in no acute distress  Chest: Lungs clear to auscultation bilaterally  Neurological examination  Mentation: Alert oriented to time, place, history taking. Follows all commands speech and language fluent Cranial nerve II-XII: Extraocular movements were full, visual field were full on confrontational test Head turning and shoulder shrug  were normal and symmetric. Motor: The motor testing reveals 5 over 5 strength of all 4 extremities. Good symmetric motor tone is noted throughout.  Sensory: Sensory testing is intact to soft touch on all 4 extremities. No evidence of extinction is noted.  Gait and station: Gait is normal.    DIAGNOSTIC DATA (LABS,  IMAGING, TESTING) - I reviewed patient records, labs, notes, testing and imaging myself where available.  Lab Results  Component Value Date   WBC 13.7 (H) 10/16/2018   HGB 11.9 (L) 10/16/2018   HCT 38.5 10/16/2018   MCV 83.0 10/16/2018   PLT 408 (H) 10/16/2018      Component Value Date/Time   NA 136 10/16/2018 1024   NA 139 04/10/2014 1021   K 4.5 10/16/2018 1024   CL 103 10/16/2018 1024   CO2 23 10/16/2018 1024   GLUCOSE 230 (H) 10/16/2018 1024   BUN 11 10/16/2018 1024   BUN 9 04/10/2014 1021   CREATININE 0.45 10/16/2018 1024   CALCIUM 9.1 10/16/2018 1024   PROT 7.1 10/16/2018 1024   PROT 6.5 04/10/2014 1021   ALBUMIN 3.6 10/16/2018 1024   ALBUMIN 4.1 04/10/2014 1021   AST 20 10/16/2018 1024   ALT 24 10/16/2018 1024   ALKPHOS 49 10/16/2018 1024   BILITOT 0.7 10/16/2018 1024   BILITOT 0.5 04/10/2014 1021   GFRNONAA >60 10/16/2018 1024   GFRAA >60 10/16/2018 1024      ASSESSMENT AND PLAN 28 y.o. year old female  has a past medical history of Anemia, Anxiety (03/17/2015), BV (bacterial vaginosis) (04/10/2014), Chronic lower back pain, Complication of anesthesia, Diabetes mellitus without complication ([place]), Encounter for menstrual regulation (03/13/2014), Family history of breast cancer, Family history of melanoma, Fatty liver (09/05/2018), Gallstones (09/05/2018), Hemorrhoids (01/12/2015), History of rectal bleeding (01/12/2015), Hypothyroidism, IC (interstitial cystitis) (01/22/2015), Pulmonary edema (08/21/2015), Sleep apnea, Thyroid disease, Vaginal discharge (03/13/2014), Vaginal itching (01/12/2015), Vestibulitis, vulvar (04/21/2014), and Yeast infection (03/13/2014). here with :  1. Obstructive sleep apnea on CPAP  The patient's CPAP download shows suboptimal compliance.  She will be  sent for a mask refitting.  She is also encouraged to try to use machine while she is awake to get more comfortable with the mask.  She is encouraged to continue using CPAP nightly and greater than 4 hours  each night.  She is advised that if his symptoms worsen or he develops new symptoms he should let us know.  She will follow-up in 6 months or sooner if needed.   I spent 15 minutes with the patient. 50% of this time was spent reviewing download   Ward Givens, MSN, NP-C 01/29/2019, 11:16 AM [facility] [address] [place], [place] [address] [phone]

## 2019-02-07 DIAGNOSIS — E1169 Type 2 diabetes mellitus with other specified complication: Secondary | ICD-10-CM | POA: Diagnosis not present

## 2019-02-12 DIAGNOSIS — F331 Major depressive disorder, recurrent, moderate: Secondary | ICD-10-CM | POA: Diagnosis not present

## 2019-02-12 DIAGNOSIS — E039 Hypothyroidism, unspecified: Secondary | ICD-10-CM | POA: Diagnosis not present

## 2019-02-12 DIAGNOSIS — E1169 Type 2 diabetes mellitus with other specified complication: Secondary | ICD-10-CM | POA: Diagnosis not present

## 2019-02-12 DIAGNOSIS — M5416 Radiculopathy, lumbar region: Secondary | ICD-10-CM | POA: Diagnosis not present

## 2019-02-12 DIAGNOSIS — G4733 Obstructive sleep apnea (adult) (pediatric): Secondary | ICD-10-CM | POA: Diagnosis not present

## 2019-02-21 MED FILL — LEVOTHYROXINE SODIUM 75 MCG: 75 | 90 days supply | Qty: 90 | Fill #1

## 2019-02-22 MED FILL — ESCITALOPRAM 20 MG TABLET: 20 | 30 days supply | Qty: 30 | Fill #2

## 2019-02-28 MED FILL — FREESTYLE LANCETS: 50 days supply | Qty: 100 | Fill #0

## 2019-02-28 MED FILL — FREESTYLE LITE TEST STRIP: 50 days supply | Qty: 100 | Fill #0

## 2019-03-27 ENCOUNTER — Other Ambulatory Visit: Payer: Self-pay | Admitting: Adult Health

## 2019-03-27 MED FILL — ESCITALOPRAM 20 MG TABLET: 20 | 30 days supply | Qty: 30 | Fill #3

## 2019-03-27 MED FILL — BLISOVI 24 FE 1-20 MG-MCG(2: 1-20 | 84 days supply | Qty: 84 | Fill #0

## 2019-04-01 MED FILL — METFORMIN HCL ER 500 MG TB2: 500 | 30 days supply | Qty: 120 | Fill #3

## 2019-04-24 ENCOUNTER — Ambulatory Visit
Admission: EM | Admit: 2019-04-24 | Discharge: 2019-04-24 | Disposition: A | Discharge status: Home / Self Care | Payer: 59 | Attending: Emergency Medicine | Admitting: Emergency Medicine

## 2019-04-24 ENCOUNTER — Other Ambulatory Visit: Payer: Self-pay

## 2019-04-24 ENCOUNTER — Encounter: Payer: Self-pay | Admitting: Emergency Medicine

## 2019-04-24 DIAGNOSIS — R220 Localized swelling, mass and lump, head: Secondary | ICD-10-CM | POA: Diagnosis not present

## 2019-04-24 MED ORDER — PREDNISONE 10 MG PO TABS: 20.0000 mg | ORAL_TABLET | Freq: Every day | ORAL | 0 refills | Status: DC

## 2019-04-24 MED ORDER — CETIRIZINE HCL 10 MG PO TABS: 10.0000 mg | ORAL_TABLET | Freq: Every day | ORAL | 0 refills | Status: DC

## 2019-04-24 NOTE — ED Provider Notes (Signed)
[facility]    CSN: [id number] Arrival date & time: 04/24/19  1700      History   Chief Complaint Chief Complaint  Patient presents with  . Facial Swelling    HPI Karen Hoover is a 29 y.o. female.   Who presented to the urgent care with a  complaint of left facial swelling for the past 3 days.  Localized swelling to the left side of her face and around left eye.  Described as  achy and uncomfortable.  Has tried OTC medication without relief.  Nothing make her symptoms worse.  Denies similar symptoms in the past.  Denies chills, fever, appetite change, weight change, chest pain, chest tightness, nausea, vomiting, diarrhea, change in bowel or bladder habits.      Past Medical History:  Diagnosis Date  . Anemia   . Anxiety 03/17/2015  . BV (bacterial vaginosis) 04/10/2014  . Chronic lower back pain    2 herniated disc sees Dr Carloyn Manner  . Complication of anesthesia    O2 desaturation, Hypotension, Fluid filled lungs  . Diabetes mellitus without complication ([place])   . Encounter for menstrual regulation 03/13/2014  . Family history of breast cancer   . Family history of melanoma   . Fatty liver 09/05/2018  . Gallstones 09/05/2018   Refer to Dr Constance Haw  . Hemorrhoids 01/12/2015  . History of rectal bleeding 01/12/2015  . Hypothyroidism   . IC (interstitial cystitis) 01/22/2015  . Pulmonary edema 08/21/2015   S/P back OR  . Sleep apnea   . Thyroid disease   . Vaginal discharge 03/13/2014  . Vaginal itching 01/12/2015  . Vestibulitis, vulvar 04/21/2014  . Yeast infection 03/13/2014    Patient Active Problem List   Diagnosis Date Noted  . Calculus of gallbladder without cholecystitis without obstruction 09/05/2018  . Fatty liver 09/05/2018  . Nausea 08/30/2018  . RUQ pain 08/30/2018  . Pelvic pain 08/30/2018  . Pain with urination 08/30/2018  . 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 Family history of breast cancer   . Family history of  melanoma   . Urge incontinence 03/23/2016  . Diarrhea 12/16/2015  . Change in bowel habits 12/16/2015  . Generalized abdominal pain 12/16/2015  . Rectal bleeding 12/16/2015  . Pulmonary edema 08/21/2015  . Anxiety 03/17/2015  . IC (interstitial cystitis) 01/22/2015  . Vaginal itching 01/12/2015  . Hemorrhoids 01/12/2015  . History of rectal bleeding 01/12/2015  . Vestibulitis, vulvar 04/21/2014  . BV (bacterial vaginosis) 04/10/2014  . Vaginal discharge 03/13/2014  . Yeast infection 03/13/2014  . Encounter for menstrual regulation 03/13/2014    Past Surgical History:  Procedure Laterality Date  . BACK SURGERY    . CHOLECYSTECTOMY N/A 10/17/2018   Procedure: LAPAROSCOPIC CHOLECYSTECTOMY;  Surgeon: Virl Cagey, MD;  Location: [facility];  Service: General;  Laterality: N/A;  . LAMINECTOMY AND MICRODISCECTOMY LUMBAR SPINE Right 08/21/2015   L4-5 and L5-S 1    OB History    Gravida  0   Para  0   Term  0   Preterm  0   AB  0   Living  0     SAB  0   TAB  0   Ectopic  0   Multiple  0   Live Births               Home Medications    Prior to Admission medications   Medication Sig Start Date End Date Taking?  Authorizing Provider  aspirin EC 81 MG tablet Take 81 mg by mouth daily.    [provider]  BLISOVI 24 FE 1-20 MG-MCG(24) tablet Take 1 tablet by mouth daily. Tell pt time for appt with me 03/27/19   Estill Dooms, NP  cetirizine (ZYRTEC ALLERGY) 10 MG tablet Take 1 tablet (10 mg total) by mouth daily. 04/24/19   Rielynn Trulson, Darrelyn Hillock, FNP  docusate sodium (COLACE) 100 MG capsule Take 1 capsule (100 mg total) by mouth 2 (two) times daily. Patient not taking: Reported on 01/29/2019 10/17/18 10/17/19  Virl Cagey, MD  escitalopram (LEXAPRO) 20 MG tablet TAKE 1 TABLET BY MOUTH ONCE DAILY Patient taking differently: Take 20 mg by mouth daily.  05/15/18   Estill Dooms, NP  fluticasone (FLONASE) 50 MCG/ACT nasal spray Place 2 sprays into  both nostrils daily as needed for allergies.  02/19/16   [provider]  gabapentin (NEURONTIN) 300 MG capsule Take 300 mg by mouth daily as needed (pain).    [provider]  Ibuprofen 200 MG CAPS Take 800 mg by mouth every 8 (eight) hours as needed for moderate pain.     [provider]  levothyroxine (SYNTHROID) 75 MCG tablet Take 75 mcg by mouth daily before breakfast.  08/09/12   [provider]  metFORMIN (GLUCOPHAGE) 500 MG tablet Take 1,000 mg by mouth 2 (two) times daily with a meal.    [provider]  methocarbamol (ROBAXIN) 500 MG tablet Take 1 tablet (500 mg total) by mouth 3 (three) times daily between meals as needed. 11/07/15   Tanna Furry, MD  Multiple Vitamin (MULTIVITAMIN WITH MINERALS) TABS tablet Take 1 tablet by mouth daily. OTC    [provider]  ondansetron (ZOFRAN) 4 MG tablet Take 1 tablet (4 mg total) by mouth daily as needed for nausea or vomiting. 10/17/18 10/17/19  Virl Cagey, MD  oxyCODONE (ROXICODONE) 5 MG immediate release tablet Take 1 tablet (5 mg total) by mouth every 4 (four) hours as needed for severe pain or breakthrough pain. 10/17/18 10/17/19  Virl Cagey, MD  predniSONE (DELTASONE) 10 MG tablet Take 2 tablets (20 mg total) by mouth daily. 04/24/19   Makeyla Govan, Darrelyn Hillock, FNP  traMADol (ULTRAM) 50 MG tablet Take 1 tablet (50 mg total) by mouth every 6 (six) hours as needed. 04/24/16   Rasch, [place] I, NP  Zinc Sulfate (ZINC 15 PO) Take by mouth. OTC    [provider]    Family History Family History  Problem Relation Age of Onset  . Diabetes Mother   . Thyroid disease Mother   . Breast cancer Mother 30       CHEK2 pos  . Thyroid disease Brother   . Diabetes Maternal Grandmother   . Melanoma Maternal Grandmother        d. 78  . Cancer Paternal Grandmother   . Melanoma Maternal Grandfather        d. 55  . Kidney cancer Other        MGF's sister  . Breast cancer Cousin 36        mother's pat first cousin    Social History Social History   Tobacco Use  . Smoking status: Never Smoker  . Smokeless tobacco: Never Used  Substance Use Topics  . Alcohol use: Yes    Comment: occasionally   . Drug use: No     Allergies   Patient has no known allergies.   Review  of Systems Review of Systems  Constitutional: Negative.   HENT: Positive for facial swelling.   Respiratory: Negative.   Cardiovascular: Negative.      Physical Exam Triage Vital Signs ED Triage Vitals  Enc Vitals Group     BP      Pulse      Resp      Temp      Temp src      SpO2      Weight      Height      Head Circumference      Peak Flow      Pain Score      Pain Loc      Pain Edu?      Excl. in [place]?    No data found.  Updated Vital Signs BP 131/85   Pulse 88   Temp 98.3 F (36.8 C)   Resp 18   LMP 04/04/2019 (Approximate)   SpO2 97%   Visual Acuity Right Eye Distance:   Left Eye Distance:   Bilateral Distance:    Right Eye Near:   Left Eye Near:    Bilateral Near:     Physical Exam Vitals and nursing note reviewed.  Constitutional:      General: She is not in acute distress.    Appearance: Normal appearance. She is normal weight. She is not ill-appearing or toxic-appearing.  HENT:     Head: Normocephalic.     Right Ear: Tympanic membrane, ear canal and external ear normal. There is no impacted cerumen.     Left Ear: Tympanic membrane and ear canal normal. There is no impacted cerumen.     Nose: Nose normal. No congestion.     Mouth/Throat:     Lips: Pink.     Mouth: Mucous membranes are moist.     Dentition: Normal dentition.     Pharynx: Oropharynx is clear. No oropharyngeal exudate or posterior oropharyngeal erythema.  Cardiovascular:     Rate and Rhythm: Normal rate and regular rhythm.     Pulses: Normal pulses.     Heart sounds: Normal heart sounds. No murmur. No friction rub. No gallop.   Pulmonary:     Effort: Pulmonary effort is normal. No  respiratory distress.     Breath sounds: Normal breath sounds. No stridor. No wheezing, rhonchi or rales.  Chest:     Chest wall: No tenderness.  Neurological:     Mental Status: She is alert.      UC Treatments / Results  Labs (all labs ordered are listed, but only abnormal results are displayed) Labs Reviewed - No data to display  EKG   Radiology No results found.  Procedures Procedures (including critical care time)  Medications Ordered in UC Medications - No data to display  Initial Impression / Assessment and Plan / UC Course  I have reviewed the triage vital signs and the nursing notes.  Pertinent labs & imaging results that were available during my care of the patient were reviewed by me and considered in my medical decision making (see chart for details).     Patient is stable for discharge. Symptom consistent with angioedema Short-term prednisone was prescribed Zyrtec was prescribed Advised patient to follow-up with primary care Return for worsening of symptoms   Final Clinical Impressions(s) / UC Diagnoses   Final diagnoses:  Facial swelling     Discharge Instructions     Take medication as prescribed  Short-term prednisone was prescribed Zyrtec was  prescribed Follow-up with PCP Return or go to ED for worsening of symptoms    ED Prescriptions    Medication Sig Dispense Auth. Provider   predniSONE (DELTASONE) 10 MG tablet Take 2 tablets (20 mg total) by mouth daily. 15 tablet Loucinda Croy, Darrelyn Hillock, FNP   cetirizine (ZYRTEC ALLERGY) 10 MG tablet Take 1 tablet (10 mg total) by mouth daily. 30 tablet Dhani Imel, Darrelyn Hillock, FNP     PDMP not reviewed this encounter.   Emerson Monte, [place] 04/24/19 1736

## 2019-04-24 NOTE — ED Triage Notes (Signed)
Pt developed swelling of left eye and left side of face on Sunday, pt states eye began swelling first

## 2019-04-24 NOTE — Discharge Instructions (Signed)
Take medication as prescribed  Short-term prednisone was prescribed Zyrtec was prescribed Follow-up with PCP Return or go to ED for worsening of symptoms

## 2019-04-29 MED FILL — ESCITALOPRAM 20 MG TABLET: 20 | 30 days supply | Qty: 30 | Fill #4

## 2019-05-07 DIAGNOSIS — G4733 Obstructive sleep apnea (adult) (pediatric): Secondary | ICD-10-CM | POA: Diagnosis not present

## 2019-05-14 ENCOUNTER — Encounter (INDEPENDENT_AMBULATORY_CARE_PROVIDER_SITE_OTHER): Payer: Self-pay

## 2019-05-14 DIAGNOSIS — E1169 Type 2 diabetes mellitus with other specified complication: Secondary | ICD-10-CM | POA: Diagnosis not present

## 2019-05-14 DIAGNOSIS — G4733 Obstructive sleep apnea (adult) (pediatric): Secondary | ICD-10-CM | POA: Diagnosis not present

## 2019-05-14 DIAGNOSIS — E038 Other specified hypothyroidism: Secondary | ICD-10-CM | POA: Diagnosis not present

## 2019-05-29 ENCOUNTER — Other Ambulatory Visit: Payer: Self-pay | Admitting: Adult Health

## 2019-05-29 MED FILL — METFORMIN HCL ER 500 MG TB2: 500 | 30 days supply | Qty: 120 | Fill #4

## 2019-05-30 ENCOUNTER — Other Ambulatory Visit (HOSPITAL_COMMUNITY): Payer: Self-pay | Admitting: Internal Medicine

## 2019-05-30 MED FILL — ESCITALOPRAM 20 MG TABLET: 20 | 30 days supply | Qty: 30 | Fill #0

## 2019-05-30 MED FILL — LEVOTHYROXINE SODIUM 75 MCG: 75 | 90 days supply | Qty: 90 | Fill #0

## 2019-06-03 MED FILL — BLISOVI 24 FE 1-20 MG-MCG(2: 1-20 | 84 days supply | Qty: 84 | Fill #0

## 2019-06-12 ENCOUNTER — Other Ambulatory Visit: Payer: 59 | Admitting: Adult Health

## 2019-07-04 MED FILL — ESCITALOPRAM 20 MG TABLET: 20 | 30 days supply | Qty: 30 | Fill #1

## 2019-07-17 ENCOUNTER — Ambulatory Visit (INDEPENDENT_AMBULATORY_CARE_PROVIDER_SITE_OTHER): Payer: 59 | Admitting: Adult Health

## 2019-07-17 ENCOUNTER — Other Ambulatory Visit (HOSPITAL_COMMUNITY)
Admission: RE | Admit: 2019-07-17 | Discharge: 2019-07-17 | Disposition: A | Discharge status: Home / Self Care | Payer: 59 | Source: Ambulatory Visit | Attending: Adult Health | Admitting: Adult Health

## 2019-07-17 ENCOUNTER — Encounter: Payer: Self-pay | Admitting: Adult Health

## 2019-07-17 ENCOUNTER — Other Ambulatory Visit: Payer: Self-pay | Admitting: Adult Health

## 2019-07-17 VITALS — BP 136/87 | HR 90 | Ht 65.0 in | Wt 256.5 lb

## 2019-07-17 DIAGNOSIS — N3942 Incontinence without sensory awareness: Secondary | ICD-10-CM

## 2019-07-17 DIAGNOSIS — Z01419 Encounter for gynecological examination (general) (routine) without abnormal findings: Secondary | ICD-10-CM | POA: Diagnosis not present

## 2019-07-17 DIAGNOSIS — N926 Irregular menstruation, unspecified: Secondary | ICD-10-CM | POA: Insufficient documentation

## 2019-07-17 MED ORDER — OXYBUTYNIN CHLORIDE ER 10 MG PO TB24: 10.0000 mg | ORAL_TABLET | Freq: Every day | ORAL | 3 refills | Status: DC

## 2019-07-17 MED ORDER — BLISOVI 24 FE 1-20 MG-MCG(24) PO TABS: ORAL_TABLET | ORAL | 4 refills | Status: DC

## 2019-07-17 NOTE — Progress Notes (Signed)
Patient ID: Karen Hoover, female   DOB: 1990-08-10, 29 y.o.   MRN: [id number] History of Present Illness: Karen Hoover is a 29 year old white female, single, G0P0, in for a well woman gyn exam and pap.  She is a Psychologist, sport and exercise 3 at [employer] PCP is  Dr Link Snuffer.   Current Medications, Allergies, Past Medical History, Past Surgical History, Family History and Social History were reviewed in [employer] record.     Review of Systems: Patient denies any headaches, hearing loss, fatigue, blurred vision, shortness of breath, chest pain, abdominal pain, problems with bowel movements, or intercourse(not active). No joint pain or mood swings. She has urinary leakage, has seen urology and had PT, without much help. Seem Moody during period Has night sweats, she has noticed.   Physical Exam:BP 136/87 (BP Location: Left Arm, Patient Position: Sitting, Cuff Size: Large)    Pulse 90    Ht 5\' 5"  (1.651 m)    Wt 256 lb 8 oz (116.3 kg)    LMP 06/16/2019 (Approximate)    BMI 42.68 kg/m  General:  Well developed, well nourished, no acute distress Skin:  Warm and dry Neck:  Midline trachea, normal thyroid, good ROM, no lymphadenopathy Lungs; Clear to auscultation bilaterally Breast:  No dominant palpable mass, retraction, or nipple discharge,left nipple inverted. Cardiovascular: Regular rate and rhythm Abdomen:  Soft, non tender, no hepatosplenomegaly,obese Pelvic:  External genitalia is normal in appearance, no lesions.  The vagina is normal in appearance. Urethra has no lesions or masses. The cervix is nulliparous, pap with high risk HPV 16/18 genotyping perforned .  Uterus is felt to be normal size, shape, and contour.  No adnexal masses or tenderness noted.Bladder is non tender, no masses felt. Extremities/musculoskeletal:  No swelling or varicosities noted, no clubbing or cyanosis Psych:  No mood changes, alert and cooperative,seems happy AA 1 Fall risk is low PHQ 9 score is 1 Examination  chaperoned by 08/16/2019 LPN  Impression and Plan: 1. Encounter for gynecological examination with Papanicolaou smear of cervix Pap sent Physical in 1 year Pap in 3 if normal   2. Urinary incontinence without sensory awareness Try oxybutynin Meds ordered this encounter  Medications   oxybutynin (DITROPAN-XL) 10 MG 24 hr tablet    Sig: Take 1 tablet (10 mg total) by mouth at bedtime.    Dispense:  30 tablet    Refill:  3    Order Specific Question:   Supervising Provider    Answer:   Malachy Mood, LUTHER H [[id number]]   BLISOVI 24 FE 1-20 MG-MCG(24) tablet    Sig: TAKE 1 TABLET BY MOUTH DAILY. NEEDS OFFICE VISIT.    Dispense:  84 tablet    Refill:  4    Order Specific Question:   Supervising Provider    Answer:   Despina Hidden H [[id number]]  Can call me with up date   3. Encounter for management of menstrual issue Try taking OCs continuously for a month or 2 to see if helps mood Refilled Blisovi

## 2019-07-18 LAB — CYTOLOGY - PAP
Comment: NEGATIVE
Diagnosis: NEGATIVE
High risk HPV: NEGATIVE

## 2019-08-01 ENCOUNTER — Ambulatory Visit (INDEPENDENT_AMBULATORY_CARE_PROVIDER_SITE_OTHER): Payer: 59 | Admitting: Adult Health

## 2019-08-01 ENCOUNTER — Other Ambulatory Visit: Payer: Self-pay

## 2019-08-01 VITALS — BP 138/77 | HR 87 | Ht 65.0 in | Wt 263.0 lb

## 2019-08-01 DIAGNOSIS — G4733 Obstructive sleep apnea (adult) (pediatric): Secondary | ICD-10-CM | POA: Diagnosis not present

## 2019-08-01 DIAGNOSIS — Z9989 Dependence on other enabling machines and devices: Secondary | ICD-10-CM

## 2019-08-01 NOTE — Progress Notes (Signed)
PATIENT: Karen Hoover DOB: 07-29-1990  REASON FOR VISIT: follow up HISTORY FROM: patient  HISTORY OF PRESENT ILLNESS: Today 08/01/19:  Karen Hoover is a 29 year old female with a history of OSA on CPAP. She returns today for follow-up. Download shows that she used her machine 15/30 days. She used her machine >4 hours for a percentage of 16.7%. Her residual AHI is 2.9 on 9 cm.  Reports that she did get a new mask after the last visit and feels that it works well for her.  She states that she still tends to take her mask off during the night unknowingly.  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  She returns today for follow-up  HISTORY 01/29/19:  Karen Hoover is a 29 year old female with a history of obstructive sleep apnea on CPAP.  Her download indicates that she use her machine 21 out of 30 days for compliance of 70%.  She is using her machine greater than 4 hours for compliance of only 16.7%.  On average she uses her machine 1 hour and 48 minutes.  Her residual AHI is 4.9 on 9 cm of water.  She states that she continues to pull the machine off at night unknowingly.  She also has realized that she is a mouth breather.  She is curious if she needs a different type of mask.  REVIEW OF SYSTEMS: Out of a complete 14 system review of symptoms, the patient complains only of the following symptoms, and all other reviewed systems are negative.  FSS 38 ESS 18  ALLERGIES: No Known Allergies  HOME MEDICATIONS: Outpatient Medications Prior to Visit  Medication Sig Dispense Refill  . aspirin EC 81 MG tablet Take 81 mg by mouth daily.    Marland Kitchen BLISOVI 24 FE 1-20 MG-MCG(24) tablet TAKE 1 TABLET BY MOUTH DAILY. NEEDS OFFICE VISIT. 84 tablet 4  . cetirizine (ZYRTEC ALLERGY) 10 MG tablet Take 1 tablet (10 mg total) by mouth daily. 30 tablet 0  . docusate sodium (COLACE) 100 MG capsule Take 1 capsule (100 mg total) by mouth 2 (two) times daily. (Patient taking differently: Take 100 mg by  mouth as needed. ) 60 capsule 2  . escitalopram (LEXAPRO) 20 MG tablet TAKE 1 TABLET BY MOUTH ONCE DAILY 30 tablet 5  . Ferrous Sulfate (IRON PO) Take by mouth.    . fluticasone (FLONASE) 50 MCG/ACT nasal spray Place 2 sprays into both nostrils daily as needed for allergies.   0  . gabapentin (NEURONTIN) 300 MG capsule Take 300 mg by mouth daily as needed (pain).    . Ibuprofen 200 MG CAPS Take 800 mg by mouth every 8 (eight) hours as needed for moderate pain.     Marland Kitchen levothyroxine (SYNTHROID) 75 MCG tablet Take 75 mcg by mouth daily before breakfast.     . metFORMIN (GLUCOPHAGE) 500 MG tablet Take 1,000 mg by mouth 2 (two) times daily with a meal.    . methocarbamol (ROBAXIN) 500 MG tablet Take 1 tablet (500 mg total) by mouth 3 (three) times daily between meals as needed. 20 tablet 0  . Multiple Vitamin (MULTIVITAMIN WITH MINERALS) TABS tablet Take 1 tablet by mouth daily. OTC    . ondansetron (ZOFRAN) 4 MG tablet Take 1 tablet (4 mg total) by mouth daily as needed for nausea or vomiting. 30 tablet 1  . oxybutynin (DITROPAN-XL) 10 MG 24 hr tablet Take 1 tablet (10 mg total) by mouth at bedtime. 30 tablet 3  .  oxyCODONE (ROXICODONE) 5 MG immediate release tablet Take 1 tablet (5 mg total) by mouth every 4 (four) hours as needed for severe pain or breakthrough pain. 30 tablet 0  . predniSONE (DELTASONE) 10 MG tablet Take 2 tablets (20 mg total) by mouth daily. (Patient taking differently: Take 20 mg by mouth as needed. ) 15 tablet 0  . traMADol (ULTRAM) 50 MG tablet Take 1 tablet (50 mg total) by mouth every 6 (six) hours as needed. 6 tablet 0  . Zinc Sulfate (ZINC 15 PO) Take by mouth. OTC     No facility-administered medications prior to visit.    PAST MEDICAL HISTORY: Past Medical History:  Diagnosis Date  . Anemia   . Anxiety 03/17/2015  . BV (bacterial vaginosis) 04/10/2014  . Chronic lower back pain    2 herniated disc sees Dr Carloyn Manner  . Complication of anesthesia    O2 desaturation,  Hypotension, Fluid filled lungs  . Diabetes mellitus without complication ([place])   . Encounter for menstrual regulation 03/13/2014  . Family history of breast cancer   . Family history of melanoma   . Fatty liver 09/05/2018  . Gallstones 09/05/2018   Refer to Dr Constance Haw  . Hemorrhoids 01/12/2015  . History of rectal bleeding 01/12/2015  . Hypothyroidism   . IC (interstitial cystitis) 01/22/2015  . Pulmonary edema 08/21/2015   S/P back OR  . Sleep apnea   . Thyroid disease   . Vaginal discharge 03/13/2014  . Vaginal itching 01/12/2015  . Vestibulitis, vulvar 04/21/2014  . Yeast infection 03/13/2014    PAST SURGICAL HISTORY: Past Surgical History:  Procedure Laterality Date  . BACK SURGERY    . CHOLECYSTECTOMY N/A 10/17/2018   Procedure: LAPAROSCOPIC CHOLECYSTECTOMY;  Surgeon: Virl Cagey, MD;  Location: [facility];  Service: General;  Laterality: N/A;  . LAMINECTOMY AND MICRODISCECTOMY LUMBAR SPINE Right 08/21/2015   L4-5 and L5-S 1    FAMILY HISTORY: Family History  Problem Relation Age of Onset  . Diabetes Mother   . Thyroid disease Mother   . Breast cancer Mother 31       CHEK2 pos  . Thyroid disease Brother   . Diabetes Maternal Grandmother   . Melanoma Maternal Grandmother        d. 71  . Cancer Paternal Grandmother   . Melanoma Maternal Grandfather        d. 18  . Kidney cancer Other        MGF's sister  . Breast cancer Cousin 57       mother's pat first cousin    SOCIAL HISTORY: Social History   Socioeconomic History  . Marital status: Single    Spouse name: Not on file  . Number of children: Not on file  . Years of education: Not on file  . Highest education level: Not on file  Occupational History  . Occupation: Surgical Occupational hygienist: [employer]  Tobacco Use  . Smoking status: Never Smoker  . Smokeless tobacco: Never Used  Vaping Use  . Vaping Use: Never used  Substance and Sexual Activity  . Alcohol use: Yes    Comment:  occasionally   . Drug use: No  . Sexual activity: Not Currently    Birth control/protection: Pill  Other Topics Concern  . Not on file  Social History Narrative  . Not on file   Social Determinants of Health   Financial Resource Strain: Low Risk   . Difficulty of  Paying Living Expenses: Not hard at all  Food Insecurity: No Food Insecurity  . Worried About Charity fundraiser in the Last Year: Never true  . Ran Out of Food in the Last Year: Never true  Transportation Needs: No Transportation Needs  . Lack of Transportation (Medical): No  . Lack of Transportation (Non-Medical): No  Physical Activity: Insufficiently Active  . Days of Exercise per Week: 7 days  . Minutes of Exercise per Session: 20 min  Stress: No Stress Concern Present  . Feeling of Stress : Only a little  Social Connections: Moderately Integrated  . Frequency of Communication with Friends and Family: More than three times a week  . Frequency of Social Gatherings with Friends and Family: More than three times a week  . Attends Religious Services: More than 4 times per year  . Active Member of Clubs or Organizations: Yes  . Attends Archivist Meetings: More than 4 times per year  . Marital Status: Never married  Intimate Partner Violence: Not At Risk  . Fear of Current or Ex-Partner: No  . Emotionally Abused: No  . Physically Abused: No  . Sexually Abused: No      PHYSICAL EXAM  Vitals:   08/01/19 0749  BP: 138/77  Pulse: 87  Weight: 263 lb (119.3 kg)  Height: '5\' 5"'$  (1.651 m)   Body mass index is 43.77 kg/m.  Generalized: Well developed, in no acute distress  Chest: Lungs clear to auscultation bilaterally  Neurological examination  Mentation: Alert oriented to time, place, history taking. Follows all commands speech and language fluent Cranial nerve II-XII: Extraocular movements were full, visual field were full on confrontational test Head turning and shoulder shrug  were normal and  symmetric. Motor: The motor testing reveals 5 over 5 strength of all 4 extremities. Good symmetric motor tone is noted throughout.  Sensory: Sensory testing is intact to soft touch on all 4 extremities. No evidence of extinction is noted.  Gait and station: Gait is normal.    DIAGNOSTIC DATA (LABS, IMAGING, TESTING) - I reviewed patient records, labs, notes, testing and imaging myself where available.  Lab Results  Component Value Date   WBC 13.7 (H) 10/16/2018   HGB 11.9 (L) 10/16/2018   HCT 38.5 10/16/2018   MCV 83.0 10/16/2018   PLT 408 (H) 10/16/2018      Component Value Date/Time   NA 136 10/16/2018 1024   NA 139 04/10/2014 1021   K 4.5 10/16/2018 1024   CL 103 10/16/2018 1024   CO2 23 10/16/2018 1024   GLUCOSE 230 (H) 10/16/2018 1024   BUN 11 10/16/2018 1024   BUN 9 04/10/2014 1021   CREATININE 0.45 10/16/2018 1024   CALCIUM 9.1 10/16/2018 1024   PROT 7.1 10/16/2018 1024   PROT 6.5 04/10/2014 1021   ALBUMIN 3.6 10/16/2018 1024   ALBUMIN 4.1 04/10/2014 1021   AST 20 10/16/2018 1024   ALT 24 10/16/2018 1024   ALKPHOS 49 10/16/2018 1024   BILITOT 0.7 10/16/2018 1024   BILITOT 0.5 04/10/2014 1021   GFRNONAA >60 10/16/2018 1024   GFRAA >60 10/16/2018 1024   No results found for: CHOL, HDL, LDLCALC, LDLDIRECT, TRIG, CHOLHDL Lab Results  Component Value Date   HGBA1C 6.8 (H) 10/16/2018   No results found for: VITAMINB12 No results found for: TSH    ASSESSMENT AND PLAN 29 y.o. year old female  has a past medical history of Anemia, Anxiety (03/17/2015), BV (bacterial vaginosis) (04/10/2014), Chronic lower  back pain, Complication of anesthesia, Diabetes mellitus without complication ([place]), Encounter for menstrual regulation (03/13/2014), Family history of breast cancer, Family history of melanoma, Fatty liver (09/05/2018), Gallstones (09/05/2018), Hemorrhoids (01/12/2015), History of rectal bleeding (01/12/2015), Hypothyroidism, IC (interstitial cystitis) (01/22/2015), Pulmonary  edema (08/21/2015), Sleep apnea, Thyroid disease, Vaginal discharge (03/13/2014), Vaginal itching (01/12/2015), Vestibulitis, vulvar (04/21/2014), and Yeast infection (03/13/2014). here with:  1. OSA on CPAP  - CPAP compliance excellent - Good treatment of AHI  - Encourage patient to use CPAP nightly and > 4 hours each night -Patient did ask about inspire information was given to the patient - F/U in 1 year or sooner if needed   I spent 20 minutes of face-to-face and non-face-to-face time with patient.  This included previsit chart review, lab review, study review, order entry, electronic health record documentation, patient education.  Ward Givens, MSN, NP-C 08/01/2019, 7:48 AM [facility] [address] [place], [place] [address] [phone]

## 2019-08-01 NOTE — Patient Instructions (Signed)
Continue using CPAP nightly and greater than 4 hours each night Try setting alarms to keep your CPAP on If your symptoms worsen or you develop new symptoms please let us know.

## 2019-08-02 DIAGNOSIS — G4733 Obstructive sleep apnea (adult) (pediatric): Secondary | ICD-10-CM | POA: Diagnosis not present

## 2019-08-02 MED FILL — ESCITALOPRAM 20 MG TABLET: 20 | 30 days supply | Qty: 30 | Fill #2

## 2019-08-07 ENCOUNTER — Telehealth: Payer: 59

## 2019-08-07 MED FILL — predniSONE 5 MG TABS: 5 | 6 days supply | Qty: 21 | Fill #0

## 2019-08-09 DIAGNOSIS — G4733 Obstructive sleep apnea (adult) (pediatric): Secondary | ICD-10-CM | POA: Diagnosis not present

## 2019-08-12 ENCOUNTER — Encounter: Payer: Self-pay | Admitting: Adult Health

## 2019-08-13 DIAGNOSIS — Z79899 Other long term (current) drug therapy: Secondary | ICD-10-CM | POA: Diagnosis not present

## 2019-08-13 DIAGNOSIS — E038 Other specified hypothyroidism: Secondary | ICD-10-CM | POA: Diagnosis not present

## 2019-08-13 DIAGNOSIS — E1169 Type 2 diabetes mellitus with other specified complication: Secondary | ICD-10-CM | POA: Diagnosis not present

## 2019-08-13 DIAGNOSIS — Z0000 Encounter for general adult medical examination without abnormal findings: Secondary | ICD-10-CM | POA: Diagnosis not present

## 2019-08-20 NOTE — Telephone Encounter (Signed)
I am not sure what the code is. If she is wanted to consider we can start the process which would be a referral to ENT

## 2019-09-04 MED FILL — LEVOTHYROXINE SODIUM 75 MCG: 75 | 90 days supply | Qty: 90 | Fill #1

## 2019-09-04 MED FILL — ESCITALOPRAM 20 MG TABLET: 20 | 30 days supply | Qty: 30 | Fill #3

## 2019-09-24 ENCOUNTER — Other Ambulatory Visit (HOSPITAL_COMMUNITY): Payer: Self-pay | Admitting: Internal Medicine

## 2019-09-24 DIAGNOSIS — E039 Hypothyroidism, unspecified: Secondary | ICD-10-CM | POA: Diagnosis not present

## 2019-09-24 DIAGNOSIS — Z0000 Encounter for general adult medical examination without abnormal findings: Secondary | ICD-10-CM | POA: Diagnosis not present

## 2019-09-24 DIAGNOSIS — R4586 Emotional lability: Secondary | ICD-10-CM | POA: Diagnosis not present

## 2019-09-24 DIAGNOSIS — R198 Other specified symptoms and signs involving the digestive system and abdomen: Secondary | ICD-10-CM | POA: Diagnosis not present

## 2019-09-24 DIAGNOSIS — F331 Major depressive disorder, recurrent, moderate: Secondary | ICD-10-CM | POA: Diagnosis not present

## 2019-09-24 DIAGNOSIS — E1169 Type 2 diabetes mellitus with other specified complication: Secondary | ICD-10-CM | POA: Diagnosis not present

## 2019-09-24 DIAGNOSIS — G4733 Obstructive sleep apnea (adult) (pediatric): Secondary | ICD-10-CM | POA: Diagnosis not present

## 2019-09-24 MED FILL — ESCITALOPRAM 10 MG TABLET: 10 | 28 days supply | Qty: 21 | Fill #0

## 2019-09-24 MED FILL — SERTRALINE HCL 25 MG TABLET: 25 | 90 days supply | Qty: 90 | Fill #0

## 2019-09-26 MED FILL — BLISOVI 24 FE 1-20 MG-MCG(2: 1-20 | 84 days supply | Qty: 84 | Fill #0

## 2019-10-18 MED FILL — METFORMIN HCL ER 500 MG TB2: 500 | 90 days supply | Qty: 360 | Fill #0

## 2019-11-20 MED FILL — FREESTYLE LITE TEST STRIP: 50 days supply | Qty: 100 | Fill #0

## 2019-11-25 DIAGNOSIS — H52203 Unspecified astigmatism, bilateral: Secondary | ICD-10-CM | POA: Diagnosis not present

## 2019-12-06 MED FILL — LEVOTHYROXINE 75 MCG TABLET: 75 | 90 days supply | Qty: 90 | Fill #2

## 2019-12-12 MED FILL — BLISOVI 24 FE 1-20 MG-MCG(2: 1-20 | 84 days supply | Qty: 84 | Fill #1

## 2020-01-15 ENCOUNTER — Other Ambulatory Visit (HOSPITAL_COMMUNITY): Payer: Self-pay | Admitting: Pharmacotherapy

## 2020-01-15 DIAGNOSIS — E1169 Type 2 diabetes mellitus with other specified complication: Secondary | ICD-10-CM | POA: Diagnosis not present

## 2020-01-15 MED FILL — TRULICITY 1.5 MG/0.5 ML PEN: 1.5 | 28 days supply | Qty: 2 | Fill #0

## 2020-01-23 ENCOUNTER — Other Ambulatory Visit (HOSPITAL_COMMUNITY): Payer: Self-pay | Admitting: Registered Nurse

## 2020-01-23 DIAGNOSIS — R059 Cough, unspecified: Secondary | ICD-10-CM | POA: Diagnosis not present

## 2020-01-23 DIAGNOSIS — Z1152 Encounter for screening for COVID-19: Secondary | ICD-10-CM | POA: Diagnosis not present

## 2020-01-23 DIAGNOSIS — J029 Acute pharyngitis, unspecified: Secondary | ICD-10-CM | POA: Diagnosis not present

## 2020-01-23 DIAGNOSIS — J019 Acute sinusitis, unspecified: Secondary | ICD-10-CM | POA: Diagnosis not present

## 2020-01-23 MED FILL — CEFDINIR 300 MG CAPSULE: 300 | 7 days supply | Qty: 14 | Fill #0

## 2020-02-18 ENCOUNTER — Other Ambulatory Visit (HOSPITAL_COMMUNITY): Payer: Self-pay | Admitting: Internal Medicine

## 2020-02-18 MED FILL — BLISOVI 24 FE 1-20 MG-MCG(2: 1-20 | 84 days supply | Qty: 84 | Fill #2

## 2020-02-18 MED FILL — SERTRALINE HCL 25 MG TABLET: 25 | 90 days supply | Qty: 90 | Fill #1

## 2020-02-18 MED FILL — LEVOTHYROXINE 75 MCG TABLET: 75 | 90 days supply | Qty: 90 | Fill #0

## 2020-02-26 MED FILL — TRULICITY 1.5 MG/0.5 ML PEN: 1.5 | 28 days supply | Qty: 2 | Fill #1

## 2020-04-07 MED FILL — TRULICITY 1.5 MG/0.5 ML PEN: 1.5 | 28 days supply | Qty: 2 | Fill #2

## 2020-04-20 ENCOUNTER — Ambulatory Visit: Payer: 59 | Admitting: Obstetrics & Gynecology

## 2020-04-20 ENCOUNTER — Encounter: Payer: Self-pay | Admitting: Obstetrics & Gynecology

## 2020-04-20 ENCOUNTER — Other Ambulatory Visit: Payer: Self-pay | Admitting: Obstetrics & Gynecology

## 2020-04-20 ENCOUNTER — Other Ambulatory Visit (HOSPITAL_COMMUNITY)
Admission: RE | Admit: 2020-04-20 | Discharge: 2020-04-20 | Disposition: A | Discharge status: Home / Self Care | Payer: 59 | Source: Ambulatory Visit | Attending: Obstetrics & Gynecology | Admitting: Obstetrics & Gynecology

## 2020-04-20 ENCOUNTER — Other Ambulatory Visit: Payer: Self-pay

## 2020-04-20 VITALS — BP 127/74 | HR 102 | Ht 65.0 in | Wt 259.4 lb

## 2020-04-20 DIAGNOSIS — N760 Acute vaginitis: Secondary | ICD-10-CM

## 2020-04-20 DIAGNOSIS — Z1389 Encounter for screening for other disorder: Secondary | ICD-10-CM | POA: Diagnosis not present

## 2020-04-20 DIAGNOSIS — Z113 Encounter for screening for infections with a predominantly sexual mode of transmission: Secondary | ICD-10-CM | POA: Insufficient documentation

## 2020-04-20 LAB — POCT URINALYSIS DIPSTICK OB
Blood, UA: NEGATIVE
Glucose, UA: NEGATIVE
Ketones, UA: NEGATIVE
Nitrite, UA: NEGATIVE
POC,PROTEIN,UA: NEGATIVE

## 2020-04-20 MED ORDER — FLUCONAZOLE 150 MG PO TABS: 150.0000 mg | ORAL_TABLET | Freq: Once | ORAL | 0 refills | Status: DC

## 2020-04-20 NOTE — Progress Notes (Signed)
GYN VISIT Patient name: Karen Hoover MRN [id number]  Date of birth: 1990/10/18 Chief Complaint:   Gynecologic Exam, Vaginal Discharge, and Urinary Tract Infection  History of Present Illness:   Karen Hoover is a 30 y.o. G0P0000 female being seen today for Vaginitis.     Started last Thursday- end of menses Notes internal itching and redness, no odor, +discharge- clear. Not tried any medication over the counter.  Notes discomfort with urination, no change in urgency or frequency.  No other acute complaints  Sexually active with new partner- used condom  Patient's last menstrual period was 04/13/2020 (exact date).  Depression screen [facility] 2/9 07/17/2019 03/23/2016  Decreased Interest 0 0  Down, Depressed, Hopeless 0 0  PHQ - 2 Score 0 0  Altered sleeping 0 -  Tired, decreased energy 0 -  Change in appetite 1 -  Feeling bad or failure about yourself  0 -  Trouble concentrating 0 -  Moving slowly or fidgety/restless 0 -  Suicidal thoughts 0 -  PHQ-9 Score 1 -  Difficult doing work/chores Not difficult at all -     Current Outpatient Medications  Medication Instructions  . aspirin EC 81 mg, Oral, Daily  . BLISOVI 24 FE 1-20 MG-MCG(24) tablet TAKE 1 TABLET BY MOUTH DAILY. NEEDS OFFICE VISIT.  Marland Kitchen cetirizine (ZYRTEC ALLERGY) 10 mg, Oral, Daily  . escitalopram (LEXAPRO) 20 MG tablet TAKE 1 TABLET BY MOUTH ONCE DAILY  . Ferrous Sulfate (IRON PO) Oral  . fluticasone (FLONASE) 50 MCG/ACT nasal spray 2 sprays, Each Nare, Daily PRN  . FREESTYLE LITE test strip 1 each, 2 times daily  . gabapentin (NEURONTIN) 300 mg, Oral, Daily PRN  . Ibuprofen 800 mg, Oral, Every 8 hours PRN  . levothyroxine (SYNTHROID) 75 mcg, Oral, Daily before breakfast  . metFORMIN (GLUCOPHAGE) 1,000 mg, Oral, 2 times daily with meals  . methocarbamol (ROBAXIN) 500 mg, Oral, 3 times daily between meals PRN  . Multiple Vitamin (MULTIVITAMIN WITH MINERALS) TABS tablet 1 tablet, Oral, Daily, OTC   . OneTouch Delica  Lancets 33G MISC Use to self monitor blood glucose twice daily (DX: ICD10: R73.01)  . oxybutynin (DITROPAN-XL) 10 mg, Oral, Daily at bedtime  . predniSONE (DELTASONE) 20 mg, Oral, Daily  . traMADol (ULTRAM) 50 mg, Oral, Every 6 hours PRN  . Trulicity 1.5 mg, Subcutaneous, Weekly  . Zinc Sulfate (ZINC 15 PO) Oral, OTC      Review of Systems:   Pertinent items are noted in HPI Denies fever/chills, dizziness, headaches, visual disturbances, fatigue, shortness of breath, chest pain, abdominal pain, vomiting, problems with periods, bowel movements, urination, or intercourse unless otherwise stated above.  Pertinent History Reviewed:  Reviewed past medical,surgical, social, obstetrical and family history.  Reviewed problem list, medications and allergies. Physical Assessment:   Vitals:   04/20/20 1539  BP: 127/74  Pulse: (!) 102  Weight: 259 lb 6.4 oz (117.7 kg)  Height: 5\' 5"  (1.651 m)  Body mass index is 43.17 kg/m.       Physical Examination:   General appearance: alert, well appearing, and in no distress  Mental status: alert, oriented to person, place, and time  Skin: warm & dry   Cardiovascular: normal heart rate noted  Respiratory: normal respiratory effort, no distress  Abdomen: soft, non-tender   Pelvic: VULVA: normal appearing vulva with no masses, tenderness or lesions, VAGINA: thick white clumpy discharge noted, CERVIX: normal appearing cervix without discharge or lesions  Extremities: no edema   Chaperone:  Assessment & Plan:  1) Acute vaginitis -finding suggestive of Yeast- Rx sent in -lab work completed  2) STI screening -Nuswab and blood work to be completed  Continue with OCPs, f/u prn or for annual  Orders Placed This Encounter  Procedures  . Hepatitis C antibody  . Hepatitis B surface antigen  . RPR  . HIV Antibody (routine testing w rflx)  . POC Urinalysis Dipstick OB    Myna Hidalgo, DO Attending Obstetrician & Gynecologist,  [facility] for [employer], [facility]

## 2020-04-21 LAB — RPR: RPR Ser Ql: NONREACTIVE

## 2020-04-21 LAB — HIV ANTIBODY (ROUTINE TESTING W REFLEX): HIV Screen 4th Generation wRfx: NONREACTIVE

## 2020-04-21 LAB — HEPATITIS C ANTIBODY: Hep C Virus Ab: <0.1 s/co ratio (ref 0.0–0.9)

## 2020-04-21 LAB — HEPATITIS B SURFACE ANTIGEN: Hepatitis B Surface Ag: NEGATIVE

## 2020-04-22 LAB — CERVICOVAGINAL ANCILLARY ONLY
Bacterial Vaginitis (gardnerella): NEGATIVE
Candida Glabrata: NEGATIVE
Candida Vaginitis: POSITIVE — AB
Chlamydia: NEGATIVE
Comment: NEGATIVE
Comment: NEGATIVE
Comment: NEGATIVE
Comment: NEGATIVE
Comment: NEGATIVE
Comment: NORMAL
Neisseria Gonorrhea: NEGATIVE
Trichomonas: NEGATIVE

## 2020-05-04 MED FILL — TRULICITY 1.5 MG/0.5 ML PEN: 1.5 | 84 days supply | Qty: 6 | Fill #3

## 2020-05-07 DIAGNOSIS — E1169 Type 2 diabetes mellitus with other specified complication: Secondary | ICD-10-CM | POA: Diagnosis not present

## 2020-05-14 ENCOUNTER — Other Ambulatory Visit (HOSPITAL_COMMUNITY): Payer: Self-pay

## 2020-05-14 MED ORDER — SYNJARDY XR 25-1000 MG PO TB24
ORAL_TABLET | ORAL | 6 refills | Status: DC
  Filled 2020-05-14: qty 30, 30d supply, fill #0
  Filled 2020-06-15: qty 30, 30d supply, fill #1
  Filled 2020-07-16: qty 30, 30d supply, fill #2
  Filled 2020-08-17: qty 30, 30d supply, fill #3
  Filled 2020-09-17: qty 30, 30d supply, fill #4
  Filled 2020-10-18: qty 30, 30d supply, fill #5

## 2020-05-15 ENCOUNTER — Other Ambulatory Visit (HOSPITAL_COMMUNITY): Payer: Self-pay | Admitting: Internal Medicine

## 2020-05-15 ENCOUNTER — Other Ambulatory Visit (HOSPITAL_COMMUNITY): Payer: Self-pay

## 2020-05-15 MED FILL — Norethindrone Ace-Ethinyl Estradiol-FE Tab 1 MG-20 MCG (24): ORAL | 84 days supply | Qty: 84 | Fill #0 | Status: AC

## 2020-05-15 MED FILL — Levothyroxine Sodium Tab 75 MCG: ORAL | 90 days supply | Qty: 90 | Fill #0 | Status: AC

## 2020-05-22 ENCOUNTER — Other Ambulatory Visit (HOSPITAL_COMMUNITY): Payer: Self-pay | Admitting: Internal Medicine

## 2020-05-22 ENCOUNTER — Other Ambulatory Visit (HOSPITAL_COMMUNITY): Payer: Self-pay

## 2020-05-24 ENCOUNTER — Encounter: Payer: Self-pay | Admitting: Adult Health

## 2020-05-28 ENCOUNTER — Other Ambulatory Visit (HOSPITAL_COMMUNITY): Payer: Self-pay

## 2020-05-28 MED ORDER — ESCITALOPRAM OXALATE 20 MG PO TABS
20.0000 mg | ORAL_TABLET | Freq: Every day | ORAL | 1 refills | Status: DC
  Filled 2020-05-28: qty 90, 90d supply, fill #0
  Filled 2020-07-21: qty 90, 90d supply, fill #1

## 2020-05-30 ENCOUNTER — Ambulatory Visit
Admission: EM | Admit: 2020-05-30 | Discharge: 2020-05-30 | Disposition: A | Discharge status: Home / Self Care | Payer: 59 | Attending: Family Medicine | Admitting: Family Medicine

## 2020-05-30 ENCOUNTER — Other Ambulatory Visit: Payer: Self-pay

## 2020-05-30 ENCOUNTER — Encounter: Payer: Self-pay | Admitting: Emergency Medicine

## 2020-05-30 DIAGNOSIS — J209 Acute bronchitis, unspecified: Secondary | ICD-10-CM

## 2020-05-30 MED ORDER — ALBUTEROL SULFATE HFA 108 (90 BASE) MCG/ACT IN AERS
2.0000 | INHALATION_SPRAY | Freq: Once | RESPIRATORY_TRACT | Status: AC
  Administered 2020-05-30: 2 via RESPIRATORY_TRACT

## 2020-05-30 MED ORDER — PREDNISONE 10 MG (21) PO TBPK: ORAL_TABLET | Freq: Every day | ORAL | 0 refills | Status: AC

## 2020-05-30 MED ORDER — GUAIFENESIN-CODEINE 100-10 MG/5ML PO SYRP: 5.0000 mL | ORAL_SOLUTION | Freq: Three times a day (TID) | ORAL | 0 refills | Status: DC | PRN

## 2020-05-30 NOTE — ED Triage Notes (Signed)
Wednesday had cough, has been using mucinex.  Runny nose.  Rapid covid test negative.

## 2020-05-30 NOTE — Discharge Instructions (Addendum)
I have sent in a prednisone taper for you to take for 6 days. 6 tablets on day one, 5 tablets on day two, 4 tablets on day three, 3 tablets on day four, 2 tablets on day five, and 1 tablet on day six.  I have sent in cough syrup for you to take. This medication can make you sleepy. Do not drive while taking this medication.  Your COVID and Influenza tests are pending.  You should self quarantine until the test results are back.    Take Tylenol or ibuprofen as needed for fever or discomfort.  Rest and keep yourself hydrated.    Follow-up with your primary care provider if your symptoms are not improving.     

## 2020-05-31 LAB — COVID-19, FLU A+B NAA
Influenza A, NAA: NOT DETECTED
Influenza B, NAA: NOT DETECTED
SARS-CoV-2, NAA: NOT DETECTED

## 2020-06-01 ENCOUNTER — Emergency Department (HOSPITAL_COMMUNITY): Payer: 59

## 2020-06-01 ENCOUNTER — Encounter (HOSPITAL_COMMUNITY): Payer: Self-pay | Admitting: *Deleted

## 2020-06-01 ENCOUNTER — Emergency Department (HOSPITAL_COMMUNITY)
Admission: EM | Admit: 2020-06-01 | Discharge: 2020-06-01 | Disposition: A | Discharge status: Home / Self Care | Payer: 59 | Attending: Emergency Medicine | Admitting: Emergency Medicine

## 2020-06-01 ENCOUNTER — Other Ambulatory Visit: Payer: Self-pay

## 2020-06-01 DIAGNOSIS — Z20822 Contact with and (suspected) exposure to covid-19: Secondary | ICD-10-CM | POA: Diagnosis not present

## 2020-06-01 DIAGNOSIS — B349 Viral infection, unspecified: Secondary | ICD-10-CM | POA: Insufficient documentation

## 2020-06-01 DIAGNOSIS — Z794 Long term (current) use of insulin: Secondary | ICD-10-CM | POA: Diagnosis not present

## 2020-06-01 DIAGNOSIS — E119 Type 2 diabetes mellitus without complications: Secondary | ICD-10-CM | POA: Diagnosis not present

## 2020-06-01 DIAGNOSIS — Z7982 Long term (current) use of aspirin: Secondary | ICD-10-CM | POA: Diagnosis not present

## 2020-06-01 DIAGNOSIS — Z79899 Other long term (current) drug therapy: Secondary | ICD-10-CM | POA: Insufficient documentation

## 2020-06-01 DIAGNOSIS — R0602 Shortness of breath: Secondary | ICD-10-CM | POA: Insufficient documentation

## 2020-06-01 DIAGNOSIS — E039 Hypothyroidism, unspecified: Secondary | ICD-10-CM | POA: Insufficient documentation

## 2020-06-01 DIAGNOSIS — Z7984 Long term (current) use of oral hypoglycemic drugs: Secondary | ICD-10-CM | POA: Diagnosis not present

## 2020-06-01 LAB — CBC WITH DIFFERENTIAL/PLATELET
Abs Immature Granulocytes: 0.18 10*3/uL — ABNORMAL HIGH (ref 0.00–0.07)
Basophils Absolute: 0.1 10*3/uL (ref 0.0–0.1)
Basophils Relative: 0 %
Eosinophils Absolute: 0 10*3/uL (ref 0.0–0.5)
Eosinophils Relative: 0 %
HCT: 40.6 % (ref 36.0–46.0)
Hemoglobin: 12.4 g/dL (ref 12.0–15.0)
Immature Granulocytes: 1 %
Lymphocytes Relative: 21 %
Lymphs Abs: 3.3 10*3/uL (ref 0.7–4.0)
MCH: 25.6 pg — ABNORMAL LOW (ref 26.0–34.0)
MCHC: 30.5 g/dL (ref 30.0–36.0)
MCV: 83.7 fL (ref 80.0–100.0)
Monocytes Absolute: 0.6 10*3/uL (ref 0.1–1.0)
Monocytes Relative: 4 %
Neutro Abs: 11.9 10*3/uL — ABNORMAL HIGH (ref 1.7–7.7)
Neutrophils Relative %: 74 %
Platelets: 510 10*3/uL — ABNORMAL HIGH (ref 150–400)
RBC: 4.85 MIL/uL (ref 3.87–5.11)
RDW: 14.4 % (ref 11.5–15.5)
WBC: 16.1 10*3/uL — ABNORMAL HIGH (ref 4.0–10.5)
nRBC: 0 % (ref 0.0–0.2)

## 2020-06-01 LAB — COMPREHENSIVE METABOLIC PANEL
ALT: 41 U/L (ref 0–44)
AST: 41 U/L (ref 15–41)
Albumin: 3.7 g/dL (ref 3.5–5.0)
Alkaline Phosphatase: 68 U/L (ref 38–126)
Anion gap: 12 (ref 5–15)
BUN: 15 mg/dL (ref 6–20)
CO2: 20 mmol/L — ABNORMAL LOW (ref 22–32)
Calcium: 9.4 mg/dL (ref 8.9–10.3)
Chloride: 101 mmol/L (ref 98–111)
Creatinine, Ser: 0.46 mg/dL (ref 0.44–1.00)
GFR, Estimated: >60 mL/min (ref >60)
Glucose, Bld: 157 mg/dL — ABNORMAL HIGH (ref 70–99)
Potassium: 4.7 mmol/L (ref 3.5–5.1)
Sodium: 133 mmol/L — ABNORMAL LOW (ref 135–145)
Total Bilirubin: 0.5 mg/dL (ref 0.3–1.2)
Total Protein: 7.6 g/dL (ref 6.5–8.1)

## 2020-06-01 LAB — RESP PANEL BY RT-PCR (FLU A&B, COVID) ARPGX2
Influenza A by PCR: NEGATIVE
Influenza B by PCR: NEGATIVE
SARS Coronavirus 2 by RT PCR: NEGATIVE

## 2020-06-01 LAB — POC URINE PREG, ED: Preg Test, Ur: NEGATIVE

## 2020-06-01 LAB — BRAIN NATRIURETIC PEPTIDE: B Natriuretic Peptide: 104 pg/mL — ABNORMAL HIGH (ref 0.0–100.0)

## 2020-06-01 LAB — D-DIMER, QUANTITATIVE: D-Dimer, Quant: 0.42 ug/mL-FEU (ref 0.00–0.50)

## 2020-06-01 MED ORDER — FLUTICASONE PROPIONATE 50 MCG/ACT NA SUSP: 2.0000 | Freq: Every day | NASAL | 2 refills | Status: AC

## 2020-06-01 MED ORDER — AEROCHAMBER Z-STAT PLUS/MEDIUM MISC
1.0000 | Freq: Once | Status: AC
  Administered 2020-06-01: 1

## 2020-06-01 MED ORDER — ALBUTEROL SULFATE (2.5 MG/3ML) 0.083% IN NEBU
2.5000 mg | INHALATION_SOLUTION | Freq: Once | RESPIRATORY_TRACT | Status: AC
  Administered 2020-06-01: 2.5 mg via RESPIRATORY_TRACT
  Filled 2020-06-01: qty 3

## 2020-06-01 NOTE — ED Provider Notes (Signed)
Karen Hoover is a 30 y.o. female, presenting to the ED with difficulty breathing for the past several days.  Upon my evaluation of the patient, she states she feels better.  Nasal congestion persists.  She also states she would like to try an albuterol nebulizer treatment to see if this "loosens up the lungs."   HPI from Alveria Apley, PA-C: "Karen Hoover is a 30 y.o. female presented for evaluation of shortness of breath and cough.  Patient states for the past 3 to 4 days she has had persistent shortness of breath and cough.  Shortness of breath is worse at night when she lays flat and with exertion.  She has associated nonproductive cough.  She was seen at urgent care a few days ago, diagnosed with bronchitis.  Given steroids, cough medication and inhaler.  She has been using this at improvement of symptoms.  She denies fevers, chills, chest pain, nausea, vomit, abdominal pain, urinary symptoms, normal bowel movements.  She does report a history of sleep apnea, has been using CPAP.  No history of heart failure.  She is on birth control pills, but denies recent travel, surgeries, mobilization, history of cancer, or history of previous DVT/PE.  She denies sick contacts.  She is vaccinated for COVID, but not a booster.  She did have a negative COVID test at urgent care 2 days ago.  She denies sick contacts.  Additional history obtained from chart review.  Patient with a history of diabetes, hypothyroidism, sleep apnea"  Past Medical History:  Diagnosis Date  . Anemia   . Anxiety 03/17/2015  . BV (bacterial vaginosis) 04/10/2014  . Chronic lower back pain    2 herniated disc sees Dr Channing Mutters  . Complication of anesthesia    O2 desaturation, Hypotension, Fluid filled lungs  . Diabetes mellitus without complication (HCC)   . Encounter for menstrual regulation 03/13/2014  . Family history of breast cancer   . Family history of melanoma   . Fatty liver 09/05/2018  . Gallstones 09/05/2018   Refer to Dr  Henreitta Leber  . Hemorrhoids 01/12/2015  . History of rectal bleeding 01/12/2015  . Hypothyroidism   . IC (interstitial cystitis) 01/22/2015  . Pulmonary edema 08/21/2015   S/P back OR  . Sleep apnea   . Thyroid disease   . Vaginal discharge 03/13/2014  . Vaginal itching 01/12/2015  . Vestibulitis, vulvar 04/21/2014  . Yeast infection 03/13/2014    Physical Exam  BP 128/82   Pulse 82   Temp 98.9 F (37.2 C)   Resp 17   LMP 05/05/2020   SpO2 97%   Physical Exam Vitals and nursing note reviewed.  Constitutional:      General: She is not in acute distress.    Appearance: She is well-developed. She is not diaphoretic.  HENT:     Head: Normocephalic and atraumatic.     Nose: Congestion present.     Mouth/Throat:     Mouth: Mucous membranes are moist.     Pharynx: Oropharynx is clear.  Eyes:     Conjunctiva/sclera: Conjunctivae normal.  Cardiovascular:     Rate and Rhythm: Normal rate and regular rhythm.     Pulses: Normal pulses.  Pulmonary:     Effort: Pulmonary effort is normal. No respiratory distress.     Breath sounds: Examination of the right-lower field reveals decreased breath sounds. Examination of the left-lower field reveals decreased breath sounds. Decreased breath sounds present.     Comments: No increased work of breathing.  Speaks in full sentences without difficulty. Abdominal:     Tenderness: There is no guarding.  Musculoskeletal:     Cervical back: Neck supple.     Right lower leg: No edema.     Left lower leg: No edema.  Skin:    General: Skin is warm and dry.  Neurological:     Mental Status: She is alert.  Psychiatric:        Mood and Affect: Mood and affect normal.        Speech: Speech normal.        Behavior: Behavior normal.     ED Course/Procedures     Procedures   Abnormal Labs Reviewed  COMPREHENSIVE METABOLIC PANEL - Abnormal; Notable for the following components:      Result Value   Sodium 133 (*)    CO2 20 (*)    Glucose, Bld 157 (*)     All other components within normal limits  BRAIN NATRIURETIC PEPTIDE - Abnormal; Notable for the following components:   B Natriuretic Peptide 104.0 (*)    All other components within normal limits  CBC WITH DIFFERENTIAL/PLATELET - Abnormal; Notable for the following components:   WBC 16.1 (*)    MCH 25.6 (*)    Platelets 510 (*)    Neutro Abs 11.9 (*)    Abs Immature Granulocytes 0.18 (*)    All other components within normal limits    DG Chest 2 View  Result Date: 06/01/2020 CLINICAL DATA:  Shortness of breath EXAM: CHEST - 2 VIEW COMPARISON:  August 22, 2015 FINDINGS: The heart size and mediastinal contours are within normal limits. Both lungs are clear. The visualized skeletal structures are unremarkable. Cholecystectomy clips. IMPRESSION: No active cardiopulmonary disease. Electronically Signed   By: Maudry Mayhew MD   On: 06/01/2020 18:26    ED ECG REPORT   Date: 06/02/2020  Rate: 75   Rhythm: normal sinus rhythm  QRS Axis: normal  Intervals: normal  ST/T Wave abnormalities: normal  Conduction Disutrbances:none  Narrative Interpretation:   Old EKG Reviewed: Rate slower on this EKG when compared to previous.  I have personally reviewed the EKG tracing and agree with the computerized printout as noted.  MDM   Clinical Course as of 06/02/20 0057  Mon Jun 01, 2020  2100 WBC(!): 16.1 Patient has been taking steroids for the last several days. [SJ]  2110 Patient states she feels better.  Her main complaint is nasal congestion.  She inquires about a breathing treatment while she is here in the ED. [SJ]    Clinical Course User Index [SJ] Anselm Pancoast, PA-C   Patient care handoff report received from [facility], PA-C. Plan: Review pending labs and reassess patient.  Patient presents with complaint of shortness of breath. Patient is nontoxic appearing, afebrile, not tachycardic, not tachypneic, not hypotensive, maintains excellent SPO2 on room air, and is in no  apparent distress.   I have reviewed the patient's chart to obtain more information.   I reviewed and interpreted the patient's labs and radiological studies. Chest x-ray without acute demounting.  No lower extremity edema.  No noted orthopnea. D-dimer negative.  EKG reassuring. BNP level barely over upper limit.  I do not think this is indicative of acute heart failure, pulmonary edema, fluid overload.  Patient voiced significant improvement, especially after albuterol nebulizer.  Lungs no longer diminished and clear on reassessment.  Ambulated without difficulty breathing, shortness of breath, or hypoxia.  The patient was given instructions  for home care as well as return precautions. Patient voices understanding of these instructions, accepts the plan, and is comfortable with discharge.    Vitals:   06/01/20 1609 06/01/20 1626 06/01/20 1700 06/01/20 1836  BP:  121/60 117/71 128/82  Pulse:  80 79 82  Resp:  20 17 17   Temp: 98.4 F (36.9 C) 98.9 F (37.2 C)    SpO2:  98% 97% 97%   Vitals:   06/01/20 2130 06/01/20 2152 06/01/20 2200 06/01/20 2230  BP: 126/60  126/63 129/71  Pulse: 67  82 79  Resp: 15  16 20   Temp:      SpO2: 97% 99% 97% 99%       06/03/20, PA-C 06/02/20 0105    Anselm Pancoast, MD 06/02/20 1212

## 2020-06-01 NOTE — ED Triage Notes (Signed)
Seen at Urgent care 2 days ago for same, states she was give steroids and cough medication, inhaler for breathing issues. Referred her today for folow up and additional treatment

## 2020-06-01 NOTE — ED Provider Notes (Signed)
[facility] EMERGENCY DEPARTMENT Provider Note   CSN: [id number] Arrival date & time: 06/01/20  1340     History Chief Complaint  Patient presents with  . Shortness of Breath    Karen Hoover is a 30 y.o. female presented for evaluation of shortness of breath and cough.  Patient states for the past 3 to 4 days she has had persistent shortness of breath and cough.  Shortness of breath is worse at night when she lays flat and with exertion.  She has associated nonproductive cough.  She was seen at urgent care a few days ago, diagnosed with bronchitis.  Given steroids, cough medication and inhaler.  She has been using this at improvement of symptoms.  She denies fevers, chills, chest pain, nausea, vomit, abdominal pain, urinary symptoms, normal bowel movements.  She does report a history of sleep apnea, has been using CPAP.  No history of heart failure.  She is on birth control pills, but denies recent travel, surgeries, mobilization, history of cancer, or history of previous DVT/PE.  She denies sick contacts.  She is vaccinated for COVID, but not a booster.  She did have a negative COVID test at urgent care 2 days ago.  She denies sick contacts.  Additional history obtained from chart review.  Patient with a history of diabetes, hypothyroidism, sleep apnea  HPI     Past Medical History:  Diagnosis Date  . Anemia   . Anxiety 03/17/2015  . BV (bacterial vaginosis) 04/10/2014  . Chronic lower back pain    2 herniated disc sees Dr Carloyn Manner  . Complication of anesthesia    O2 desaturation, Hypotension, Fluid filled lungs  . Diabetes mellitus without complication ([place])   . Encounter for menstrual regulation 03/13/2014  . Family history of breast cancer   . Family history of melanoma   . Fatty liver 09/05/2018  . Gallstones 09/05/2018   Refer to Dr Constance Haw  . Hemorrhoids 01/12/2015  . History of rectal bleeding 01/12/2015  . Hypothyroidism   . IC (interstitial cystitis) 01/22/2015  . Pulmonary  edema 08/21/2015   S/P back OR  . Sleep apnea   . Thyroid disease   . Vaginal discharge 03/13/2014  . Vaginal itching 01/12/2015  . Vestibulitis, vulvar 04/21/2014  . Yeast infection 03/13/2014    Patient Active Problem List   Diagnosis Date Noted  . Urinary incontinence without sensory awareness 07/17/2019  . Encounter for gynecological examination with Papanicolaou smear of cervix 07/17/2019  . Encounter for management of menstrual issue 07/17/2019  . Calculus of gallbladder without cholecystitis without obstruction 09/05/2018  . Fatty liver 09/05/2018  . Nausea 08/30/2018  . RUQ pain 08/30/2018  . Pelvic pain 08/30/2018  . Pain with urination 08/30/2018  . 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 Family history of breast cancer   . Family history of melanoma   . Urge incontinence 03/23/2016  . Diarrhea 12/16/2015  . Change in bowel habits 12/16/2015  . Generalized abdominal pain 12/16/2015  . Rectal bleeding 12/16/2015  . Pulmonary edema 08/21/2015  . Anxiety 03/17/2015  . IC (interstitial cystitis) 01/22/2015  . Vaginal itching 01/12/2015  . Hemorrhoids 01/12/2015  . History of rectal bleeding 01/12/2015  . Vestibulitis, vulvar 04/21/2014  . BV (bacterial vaginosis) 04/10/2014  . Vaginal discharge 03/13/2014  . Yeast infection 03/13/2014  . Encounter for menstrual regulation 03/13/2014    Past Surgical History:  Procedure Laterality Date  . BACK SURGERY    . CHOLECYSTECTOMY N/A  10/17/2018   Procedure: LAPAROSCOPIC CHOLECYSTECTOMY;  Surgeon: Virl Cagey, MD;  Location: [facility];  Service: General;  Laterality: N/A;  . LAMINECTOMY AND MICRODISCECTOMY LUMBAR SPINE Right 08/21/2015   L4-5 and L5-S 1     OB History    Gravida  0   Para  0   Term  0   Preterm  0   AB  0   Living  0     SAB  0   IAB  0   Ectopic  0   Multiple  0   Live Births              Family History  Problem Relation Age of Onset  . Diabetes  Mother   . Thyroid disease Mother   . Breast cancer Mother 3       CHEK2 pos  . Thyroid disease Brother   . Diabetes Maternal Grandmother   . Melanoma Maternal Grandmother        d. 64  . Cancer Paternal Grandmother   . Melanoma Maternal Grandfather        d. 44  . Kidney cancer Other        MGF's sister  . Breast cancer Cousin 97       mother's pat first cousin    Social History   Tobacco Use  . Smoking status: Never Smoker  . Smokeless tobacco: Never Used  Vaping Use  . Vaping Use: Never used  Substance Use Topics  . Alcohol use: Yes    Comment: occasionally   . Drug use: No    Home Medications Prior to Admission medications   Medication Sig Start Date End Date Taking? Authorizing Provider  albuterol (VENTOLIN HFA) 108 (90 Base) MCG/ACT inhaler Inhale 2 puffs into the lungs every 8 (eight) hours as needed for wheezing or shortness of breath.   Yes [provider]  APPLE CIDER VINEGAR PO Take 2 tablets by mouth daily.   Yes [provider]  BLISOVI 24 FE 1-20 MG-MCG(24) tablet TAKE 1 TABLET BY MOUTH DAILY. NEEDS OFFICE VISIT. 07/17/19 08/07/20 Yes Estill Dooms, NP  cetirizine (ZYRTEC ALLERGY) 10 MG tablet Take 1 tablet (10 mg total) by mouth daily. 04/24/19  Yes Avegno, Darrelyn Hillock, FNP  Dulaglutide 1.5 MG/0.5ML SOPN INJECT 1.5MG SUBCUTANEOUS ONCE A WEEK 01/15/20 01/14/21 Yes Tivis Ringer, RPH-CPP  Empagliflozin-metFORMIN HCl ER (SYNJARDY XR) 25-1000 MG TB24 Take 1 tablet by mouth daily with breakfast. 05/14/20  Yes   escitalopram (LEXAPRO) 20 MG tablet TAKE 1 TABLET BY MOUTH ONCE DAILY 05/29/19  Yes Derrek Monaco A, NP  Ferrous Sulfate (IRON PO) Take 1 tablet by mouth daily.   Yes [provider]  fluticasone (FLONASE) 50 MCG/ACT nasal spray Place 2 sprays into both nostrils daily as needed for allergies.  02/19/16  Yes [provider]  gabapentin (NEURONTIN) 300 MG capsule Take 300 mg by mouth daily as needed (pain).   Yes [provider]  guaiFENesin-codeine (ROBITUSSIN AC) 100-10 MG/5ML syrup Take 5 mLs by mouth 3 (three) times daily as needed for cough. 05/30/20  Yes Faustino Congress, NP  Ibuprofen 200 MG CAPS Take 800 mg by mouth every 8 (eight) hours as needed for moderate pain.    Yes [provider]  levothyroxine (SYNTHROID) 75 MCG tablet TAKE 1 TABLET BY MOUTH DAILY EVERY MORNING 02/18/20 02/17/21 Yes Velna Hatchet, MD  levothyroxine (SYNTHROID) 75 MCG tablet TAKE 1 TABLET BY MOUTH IN THE MORNING 05/30/19 05/29/20 Yes  Velna Hatchet, MD  methocarbamol (ROBAXIN) 500 MG tablet Take 1 tablet (500 mg total) by mouth 3 (three) times daily between meals as needed. 11/07/15  Yes Tanna Furry, MD  Multiple Vitamin (MULTIVITAMIN WITH MINERALS) TABS tablet Take 1 tablet by mouth daily. OTC   Yes [provider]  oxybutynin (DITROPAN-XL) 10 MG 24 hr tablet Take 1 tablet (10 mg total) by mouth at bedtime. 07/17/19  Yes Derrek Monaco A, NP  predniSONE (STERAPRED UNI-PAK 21 TAB) 10 MG (21) TBPK tablet Take by mouth daily for 6 days. Take 6 tablets on day 1, 5 tablets on day 2, 4 tablets on day 3, 3 tablets on day 4, 2 tablets on day 5, 1 tablet on day 6 05/30/20 06/05/20 Yes Faustino Congress, NP  sertraline (ZOLOFT) 25 MG tablet TAKE 1 TABLET BY MOUTH ONCE DAILY. 09/24/19 09/23/20 Yes Velna Hatchet, MD  traMADol (ULTRAM) 50 MG tablet Take 1 tablet (50 mg total) by mouth every 6 (six) hours as needed. 04/24/16  Yes Rasch, Bjorkman Malta I, NP  TRULICITY 1.5 CM/0.3KJ SOPN Inject 1.5 mg into the skin once a week. 04/07/20  Yes [provider]  aspirin EC 81 MG tablet Take 81 mg by mouth daily. Patient not taking: Reported on 06/01/2020    [provider]  cefdinir (OMNICEF) 300 MG capsule TAKE 1 CAPSULE BY MOUTH 2 TIMES DAILY FOR 7 DAYS Patient not taking: No sig reported 01/23/20 01/22/21  Fredonia Highland, NP  escitalopram (LEXAPRO) 20 MG tablet Take 1 tablet (20 mg total) by mouth  daily. Patient not taking: No sig reported 05/28/20     fluconazole (DIFLUCAN) 150 MG tablet TAKE 1 TABLET (150 MG TOTAL) BY MOUTH ONCE FOR 1 DOSE. IF SYMPTOMS PERSIST, TAKE AN ADDITIONAL TABLET Patient not taking: No sig reported 04/20/20 04/20/21  Janyth Pupa, DO  FREESTYLE LITE test strip 1 each 2 (two) times daily. 11/20/19   [provider]  levothyroxine (SYNTHROID) 75 MCG tablet Take 75 mcg by mouth daily before breakfast.  Patient not taking: Reported on 06/01/2020 08/09/12   [provider]  metFORMIN (GLUCOPHAGE) 500 MG tablet Take 1,000 mg by mouth 2 (two) times daily with a meal. Patient not taking: Reported on 06/01/2020    [provider]  OneTouch Delica Lancets 17H MISC Use to self monitor blood glucose twice daily (DX: ICD10: R73.01) 09/06/17   [provider]  Zinc Sulfate (ZINC 15 PO) Take by mouth. OTC Patient not taking: No sig reported    [provider]    Allergies    Metformin hcl er  Review of Systems   Review of Systems  Respiratory: Positive for cough and shortness of breath.   All other systems reviewed and are negative.   Physical Exam Updated Vital Signs BP 121/60 (BP Location: Left Arm)   Pulse 80   Temp 98.9 F (37.2 C)   Resp 20   LMP 05/05/2020   SpO2 98%   Physical Exam Vitals and nursing note reviewed.  Constitutional:      General: She is not in acute distress.    Appearance: She is well-developed. She is obese.     Comments: nontoxic  HENT:     Head: Normocephalic and atraumatic.  Eyes:     Conjunctiva/sclera: Conjunctivae normal.     Pupils: Pupils are equal, round, and reactive to light.  Cardiovascular:     Rate and Rhythm: Normal rate and regular rhythm.     Pulses: Normal pulses.  Pulmonary:     Effort: Pulmonary effort is normal. No respiratory distress.     Breath sounds: Normal breath sounds. No wheezing.     Comments: No significant wheezing noted on my exam.  Speaking in full  sentences.  Sats stable. Abdominal:     General: There is no distension.     Palpations: Abdomen is soft. There is no mass.     Tenderness: There is no abdominal tenderness. There is no guarding or rebound.  Musculoskeletal:        General: Normal range of motion.     Cervical back: Normal range of motion and neck supple.     Right lower leg: No edema.     Left lower leg: No edema.  Skin:    General: Skin is warm and dry.     Capillary Refill: Capillary refill takes less than 2 seconds.  Neurological:     Mental Status: She is alert and oriented to person, place, and time.     ED Results / Procedures / Treatments   Labs (all labs ordered are listed, but only abnormal results are displayed) Labs Reviewed  CBC WITH DIFFERENTIAL/PLATELET  COMPREHENSIVE METABOLIC PANEL  BRAIN NATRIURETIC PEPTIDE  D-DIMER, QUANTITATIVE  I-STAT BETA HCG BLOOD, ED ([facility], [facility], [facility] ONLY)    EKG None  Radiology No results found.  Procedures Procedures   Medications Ordered in ED Medications - No data to display  ED Course  I have reviewed the triage vital signs and the nursing notes.  Pertinent labs & imaging results that were available during my care of the patient were reviewed by me and considered in my medical decision making (see chart for details).    MDM Rules/Calculators/A&P                          Patient presented for evaluation of worsening shortness of breath and cough.  On exam, patient peers nontoxic.  She reports symptoms are worse when laying flat and with exertion.  As such, consider CHF.  Consider PE as patient has increased discomfort with inspiration.  Less likely ACS as patient without chest pain other times.  Consider viral illness.  Will obtain labs, chest x-ray, EKG, and ambulate with pulse ox.    Chest x-ray viewed and independently interpreted by me, no pneumonia pneumothorax or effusion.  Pt signed out to Marval Regal, PA-C for f/u on labs and reassessment of pt. If labs  ok and pt able to ambulate without difficulty, plan for d/c with tx for viral illness.   Final Clinical Impression(s) / ED Diagnoses Final diagnoses:  None    Rx / DC Orders ED Discharge Orders    None       Franchot Heidelberg, PA-C 06/01/20 1913    Milton Ferguson, MD 06/02/20 1212

## 2020-06-01 NOTE — ED Notes (Signed)
Ambulated pt around the nurses station and O2 stats range 98-99 and heart rate range is 99-107 on room air. RN notified.

## 2020-06-01 NOTE — Discharge Instructions (Addendum)
Continue taking the medication you were prescribed. Follow-up with your primary care doctor as needed for recheck of your symptoms. You may try a humidifier or moisture to help with nighttime wheezing/shortness of breath. Return to the emergency room with any new, worsening, concerning symptoms   General Viral Syndrome Care Instructions:  Your symptoms are likely consistent with a viral illness. Viruses do not require or respond to antibiotics. Treatment is symptomatic care and it is important to note that these symptoms may last for 7-14 days.   Hand washing: Wash your hands throughout the day, but especially before and after touching the face, using the restroom, sneezing, coughing, or touching surfaces that have been coughed or sneezed upon. Hydration: Symptoms of most illnesses will be intensified and complicated by dehydration. Dehydration can also extend the duration of symptoms. Drink plenty of fluids and get plenty of rest. You should be drinking at least half a liter of water an hour to stay hydrated. Electrolyte drinks (ex. Gatorade, Powerade, Pedialyte) are also encouraged. You should be drinking enough fluids to make your urine light yellow, almost clear. If this is not the case, you are not drinking enough water. Please note that some of the treatments indicated below will not be effective if you are not adequately hydrated. Diet: Please concentrate on hydration, however, you may introduce food slowly.  Start with a clear liquid diet, progressed to a full liquid diet, and then bland solids as you are able. Pain or fever: Ibuprofen, Naproxen, or acetaminophen (generic for Tylenol) for pain or fever.  Antiinflammatory medications: Take 600 mg of ibuprofen every 6 hours or 440 mg (over the counter dose) to 500 mg (prescription dose) of naproxen every 12 hours for the next 3 days. After this time, these medications may be used as needed for pain. Take these medications with food to avoid  upset stomach. Choose only one of these medications, do not take them together. Acetaminophen (generic for Tylenol): Should you continue to have additional pain while taking the ibuprofen or naproxen, you may add in acetaminophen as needed. Your daily total maximum amount of acetaminophen from all sources should be limited to 4000mg /day for persons without liver problems, or 2000mg /day for those with liver problems. Albuterol: May use the albuterol as needed for instances of shortness of breath. Zyrtec or Claritin: May add these medication daily to control underlying symptoms of congestion, sneezing, and other signs of allergies.  These medications are available over-the-counter. Generics: Cetirizine (generic for Zyrtec) and loratadine (generic for Claritin). Fluticasone: Use fluticasone (generic for Flonase), as directed, for nasal and sinus congestion.  This medication is available over-the-counter. Congestion: Plain guaifenesin (generic for plain Mucinex) may help relieve congestion. Saline sinus rinses and saline nasal sprays may also help relieve congestion. If you do not have high blood pressure, heart problems, or an allergy to such medications, you may also try phenylephrine or Sudafed. Sore throat: Warm liquids or Chloraseptic spray may help soothe a sore throat. Gargle twice a day with a salt water solution made from a half teaspoon of salt in a cup of warm water.  Follow up: Follow up with a primary care provider within the next two weeks should symptoms fail to resolve. Return: Return to the ED for significantly worsening symptoms, shortness of breath, persistent vomiting, large amounts of blood in stool, or any other major concerns.  For prescription assistance, may try using prescription discount sites or apps, such as goodrx.com

## 2020-06-02 ENCOUNTER — Other Ambulatory Visit (HOSPITAL_COMMUNITY): Payer: Self-pay

## 2020-06-02 ENCOUNTER — Other Ambulatory Visit (HOSPITAL_COMMUNITY): Payer: Self-pay | Admitting: Internal Medicine

## 2020-06-02 NOTE — ED Provider Notes (Addendum)
[facility]    CSN: [id number] Arrival date & time: 05/30/20  1147      History   Chief Complaint No chief complaint on file.   HPI Karen Hoover is a 30 y.o. female.   Reports that she has been having cough for the last 4 days.  States that she has been using Mucinex, having runny nose as well.  States she took a rapid COVID test at home and it was negative.  Has positive history of COVID.  Has not had COVID vaccines.  Has not had flu vaccine.  States that she has history of bronchitis.  Does not have inhalers or any other inhaled medications that she can use at this time.  Denies headache, abdominal pain, nausea, vomiting, diarrhea, rash, fever, other symptoms.  Denies sick contacts.  ROS per HPI  The history is provided by the patient.    Past Medical History:  Diagnosis Date  . Anemia   . Anxiety 03/17/2015  . BV (bacterial vaginosis) 04/10/2014  . Chronic lower back pain    2 herniated disc sees Dr Carloyn Manner  . Complication of anesthesia    O2 desaturation, Hypotension, Fluid filled lungs  . Diabetes mellitus without complication ([place])   . Encounter for menstrual regulation 03/13/2014  . Family history of breast cancer   . Family history of melanoma   . Fatty liver 09/05/2018  . Gallstones 09/05/2018   Refer to Dr Constance Haw  . Hemorrhoids 01/12/2015  . History of rectal bleeding 01/12/2015  . Hypothyroidism   . IC (interstitial cystitis) 01/22/2015  . Pulmonary edema 08/21/2015   S/P back OR  . Sleep apnea   . Thyroid disease   . Vaginal discharge 03/13/2014  . Vaginal itching 01/12/2015  . Vestibulitis, vulvar 04/21/2014  . Yeast infection 03/13/2014    Patient Active Problem List   Diagnosis Date Noted  . Urinary incontinence without sensory awareness 07/17/2019  . Encounter for gynecological examination with Papanicolaou smear of cervix 07/17/2019  . Encounter for management of menstrual issue 07/17/2019  . Calculus of gallbladder without cholecystitis without  obstruction 09/05/2018  . Fatty liver 09/05/2018  . Nausea 08/30/2018  . RUQ pain 08/30/2018  . Pelvic pain 08/30/2018  . Pain with urination 08/30/2018  . 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 Family history of breast cancer   . Family history of melanoma   . Urge incontinence 03/23/2016  . Diarrhea 12/16/2015  . Change in bowel habits 12/16/2015  . Generalized abdominal pain 12/16/2015  . Rectal bleeding 12/16/2015  . Pulmonary edema 08/21/2015  . Anxiety 03/17/2015  . IC (interstitial cystitis) 01/22/2015  . Vaginal itching 01/12/2015  . Hemorrhoids 01/12/2015  . History of rectal bleeding 01/12/2015  . Vestibulitis, vulvar 04/21/2014  . BV (bacterial vaginosis) 04/10/2014  . Vaginal discharge 03/13/2014  . Yeast infection 03/13/2014  . Encounter for menstrual regulation 03/13/2014    Past Surgical History:  Procedure Laterality Date  . BACK SURGERY    . CHOLECYSTECTOMY N/A 10/17/2018   Procedure: LAPAROSCOPIC CHOLECYSTECTOMY;  Surgeon: Virl Cagey, MD;  Location: [facility];  Service: General;  Laterality: N/A;  . LAMINECTOMY AND MICRODISCECTOMY LUMBAR SPINE Right 08/21/2015   L4-5 and L5-S 1    OB History    Gravida  0   Para  0   Term  0   Preterm  0   AB  0   Living  0     SAB  0  IAB  0   Ectopic  0   Multiple  0   Live Births               Home Medications    Prior to Admission medications   Medication Sig Start Date End Date Taking? Authorizing Provider  guaiFENesin-codeine (ROBITUSSIN AC) 100-10 MG/5ML syrup Take 5 mLs by mouth 3 (three) times daily as needed for cough. 05/30/20  Yes Faustino Congress, NP  predniSONE (STERAPRED UNI-PAK 21 TAB) 10 MG (21) TBPK tablet Take by mouth daily for 6 days. Take 6 tablets on day 1, 5 tablets on day 2, 4 tablets on day 3, 3 tablets on day 4, 2 tablets on day 5, 1 tablet on day 6 05/30/20 06/05/20 Yes Faustino Congress, NP  albuterol (VENTOLIN HFA) 108 (90 Base)  MCG/ACT inhaler Inhale 2 puffs into the lungs every 8 (eight) hours as needed for wheezing or shortness of breath.    [provider]  APPLE CIDER VINEGAR PO Take 2 tablets by mouth daily.    [provider]  aspirin EC 81 MG tablet Take 81 mg by mouth daily. Patient not taking: Reported on 06/01/2020    [provider]  BLISOVI 24 FE 1-20 MG-MCG(24) tablet TAKE 1 TABLET BY MOUTH DAILY. NEEDS OFFICE VISIT. 07/17/19 08/07/20  Estill Dooms, NP  cefdinir (OMNICEF) 300 MG capsule TAKE 1 CAPSULE BY MOUTH 2 TIMES DAILY FOR 7 DAYS Patient not taking: No sig reported 01/23/20 01/22/21  Fredonia Highland, NP  cetirizine (ZYRTEC ALLERGY) 10 MG tablet Take 1 tablet (10 mg total) by mouth daily. 04/24/19   Emerson Monte, FNP  Dulaglutide 1.5 MG/0.5ML SOPN INJECT 1.$RemoveBefor'5MG'[id number]$  SUBCUTANEOUS ONCE A WEEK 01/15/20 01/14/21  Tivis Ringer, RPH-CPP  Empagliflozin-metFORMIN HCl ER (SYNJARDY XR) 25-1000 MG TB24 Take 1 tablet by mouth daily with breakfast. 05/14/20     escitalopram (LEXAPRO) 20 MG tablet TAKE 1 TABLET BY MOUTH ONCE DAILY 05/29/19   Derrek Monaco A, NP  escitalopram (LEXAPRO) 20 MG tablet Take 1 tablet (20 mg total) by mouth daily. Patient not taking: No sig reported 05/28/20     Ferrous Sulfate (IRON PO) Take 1 tablet by mouth daily.    [provider]  fluconazole (DIFLUCAN) 150 MG tablet TAKE 1 TABLET (150 MG TOTAL) BY MOUTH ONCE FOR 1 DOSE. IF SYMPTOMS PERSIST, TAKE AN ADDITIONAL TABLET Patient not taking: No sig reported 04/20/20 04/20/21  Janyth Pupa, DO  fluticasone (FLONASE) 50 MCG/ACT nasal spray Place 2 sprays into both nostrils daily as needed for allergies.  02/19/16   [provider]  fluticasone (FLONASE) 50 MCG/ACT nasal spray Place 2 sprays into both nostrils daily. 06/01/20   Joy, Shawn C, PA-C  FREESTYLE LITE test strip 1 each 2 (two) times daily. 11/20/19   [provider]  gabapentin (NEURONTIN) 300 MG capsule Take 300 mg by  mouth daily as needed (pain).    [provider]  Ibuprofen 200 MG CAPS Take 800 mg by mouth every 8 (eight) hours as needed for moderate pain.     [provider]  levothyroxine (SYNTHROID) 75 MCG tablet Take 75 mcg by mouth daily before breakfast.  Patient not taking: Reported on 06/01/2020 08/09/12   [provider]  levothyroxine (SYNTHROID) 75 MCG tablet TAKE 1 TABLET BY MOUTH DAILY EVERY MORNING 02/18/20 02/17/21  Velna Hatchet, MD  levothyroxine (SYNTHROID) 75 MCG tablet TAKE 1 TABLET BY MOUTH IN THE MORNING 05/30/19 05/29/20  Velna Hatchet, MD  metFORMIN (  GLUCOPHAGE) 500 MG tablet Take 1,000 mg by mouth 2 (two) times daily with a meal. Patient not taking: Reported on 06/01/2020    [provider]  methocarbamol (ROBAXIN) 500 MG tablet Take 1 tablet (500 mg total) by mouth 3 (three) times daily between meals as needed. 11/07/15   Tanna Furry, MD  Multiple Vitamin (MULTIVITAMIN WITH MINERALS) TABS tablet Take 1 tablet by mouth daily. OTC    [provider]  OneTouch Delica Lancets 95M MISC Use to self monitor blood glucose twice daily (DX: ICD10: R73.01) 09/06/17   [provider]  oxybutynin (DITROPAN-XL) 10 MG 24 hr tablet Take 1 tablet (10 mg total) by mouth at bedtime. 07/17/19   Estill Dooms, NP  sertraline (ZOLOFT) 25 MG tablet TAKE 1 TABLET BY MOUTH ONCE DAILY. 09/24/19 09/23/20  Velna Hatchet, MD  traMADol (ULTRAM) 50 MG tablet Take 1 tablet (50 mg total) by mouth every 6 (six) hours as needed. 04/24/16   Rasch, Tarbet Malta I, NP  TRULICITY 1.5 WU/1.3KG SOPN Inject 1.5 mg into the skin once a week. 04/07/20   [provider]  Zinc Sulfate (ZINC 15 PO) Take by mouth. OTC Patient not taking: No sig reported    [provider]    Family History Family History  Problem Relation Age of Onset  . Diabetes Mother   . Thyroid disease Mother   . Breast cancer Mother 29       CHEK2 pos  . Thyroid disease Brother   .  Diabetes Maternal Grandmother   . Melanoma Maternal Grandmother        d. 78  . Cancer Paternal Grandmother   . Melanoma Maternal Grandfather        d. 79  . Kidney cancer Other        MGF's sister  . Breast cancer Cousin 9       mother's pat first cousin    Social History Social History   Tobacco Use  . Smoking status: Never Smoker  . Smokeless tobacco: Never Used  Vaping Use  . Vaping Use: Never used  Substance Use Topics  . Alcohol use: Yes    Comment: occasionally   . Drug use: No     Allergies   Metformin hcl er   Review of Systems Review of Systems   Physical Exam Triage Vital Signs ED Triage Vitals  Enc Vitals Group     BP 05/30/20 1153 131/79     Pulse Rate 05/30/20 1153 89     Resp 05/30/20 1153 18     Temp 05/30/20 1153 98.2 F (36.8 C)     Temp Source 05/30/20 1153 Oral     SpO2 05/30/20 1153 97 %     Weight 05/30/20 1159 253 lb (114.8 kg)     Height --      Head Circumference --      Peak Flow --      Pain Score 05/30/20 1158 0     Pain Loc --      Pain Edu? --      Excl. in [place]? --    No data found.  Updated Vital Signs BP 131/79   Pulse 89   Temp 98.2 F (36.8 C) (Oral)   Resp 18   Wt 253 lb (114.8 kg)   LMP 05/05/2020   SpO2 97%   BMI 42.10 kg/m   Visual Acuity Right Eye Distance:   Left Eye Distance:   Bilateral Distance:  Right Eye Near:   Left Eye Near:    Bilateral Near:     Physical Exam Vitals and nursing note reviewed.  Constitutional:      General: She is not in acute distress.    Appearance: Normal appearance. She is well-developed. She is obese. She is not ill-appearing.  HENT:     Head: Normocephalic and atraumatic.     Right Ear: Tympanic membrane, ear canal and external ear normal.     Left Ear: Tympanic membrane, ear canal and external ear normal.     Nose: Nose normal.     Mouth/Throat:     Mouth: Mucous membranes are moist.     Pharynx: Oropharynx is clear. Posterior oropharyngeal erythema  present.  Eyes:     Conjunctiva/sclera: Conjunctivae normal.     Pupils: Pupils are equal, round, and reactive to light.  Cardiovascular:     Rate and Rhythm: Normal rate and regular rhythm.     Heart sounds: Normal heart sounds. No murmur heard.   Pulmonary:     Effort: Pulmonary effort is normal. No respiratory distress.     Breath sounds: No stridor. Wheezing present. No rhonchi or rales.     Comments: Dry cough Chest:     Chest wall: No tenderness.  Abdominal:     Palpations: Abdomen is soft.     Tenderness: There is no abdominal tenderness.  Musculoskeletal:        General: Normal range of motion.     Cervical back: Normal range of motion and neck supple.  Skin:    General: Skin is warm and dry.     Capillary Refill: Capillary refill takes less than 2 seconds.  Neurological:     General: No focal deficit present.     Mental Status: She is alert and oriented to person, place, and time.  Psychiatric:        Mood and Affect: Mood normal.        Behavior: Behavior normal.        Thought Content: Thought content normal.      UC Treatments / Results  Labs (all labs ordered are listed, but only abnormal results are displayed) Labs Reviewed  COVID-19, FLU A+B NAA   Narrative:    Test(s) 140142-Influenza A, NAA; 140143-Influenza B, NAA was developed and its performance characteristics determined by [facility]. It has not been cleared or approved by the Food and Drug Administration. Performed at:  [address] [place], [place]  [id number] Lab Director: Rush Farmer MD, Phone:  [phone]    EKG    Procedures Procedures (including critical care time)  Medications Ordered in UC Medications  albuterol (VENTOLIN HFA) 108 (90 Base) MCG/ACT inhaler 2 puff (2 puffs Inhalation Given 05/30/20 1220)    Initial Impression / Assessment and Plan / UC Course  I have reviewed the triage vital signs and the nursing notes.  Pertinent labs & imaging  results that were available during my care of the patient were reviewed by me and considered in my medical decision making (see chart for details).    Acute bronchitis  Albuterol inhaler prescribed and given in office 2 puffs every 4 hours as needed for cough, shortness of breath, wheezing Steroid taper prescribed Cough syrup prescribed Sedation precautions given Covid swab obtained in office today.   Patient instructed to quarantine until results are back and negative.   If results are negative, patient may resume daily schedule as tolerated once they are fever  free for 24 hours without the use of antipyretic medications.   If results are positive, patient instructed to quarantine for at least 5 days from symptom onset.  If after 5 days symptoms have resolved, may return to work with a well fitting mask for the next 5 days. If symptomatic after day 5, isolation should be extended to 10 days. Patient instructed to follow-up with primary care or with this office as needed.   Patient instructed to follow-up in the ER for trouble swallowing, trouble breathing, other concerning symptoms.    Final Clinical Impressions(s) / UC Diagnoses   Final diagnoses:  Acute bronchitis, unspecified organism     Discharge Instructions     I have sent in a prednisone taper for you to take for 6 days. 6 tablets on day one, 5 tablets on day two, 4 tablets on day three, 3 tablets on day four, 2 tablets on day five, and 1 tablet on day six.  I have sent in cough syrup for you to take. This medication can make you sleepy. Do not drive while taking this medication.  Your COVID and Influenza tests are pending.  You should self quarantine until the test results are back.    Take Tylenol or ibuprofen as needed for fever or discomfort.  Rest and keep yourself hydrated.    Follow-up with your primary care provider if your symptoms are not improving.         ED Prescriptions    Medication Sig Dispense  Auth. Provider   predniSONE (STERAPRED UNI-PAK 21 TAB) 10 MG (21) TBPK tablet Take by mouth daily for 6 days. Take 6 tablets on day 1, 5 tablets on day 2, 4 tablets on day 3, 3 tablets on day 4, 2 tablets on day 5, 1 tablet on day 6 21 tablet Faustino Congress, NP   guaiFENesin-codeine (ROBITUSSIN AC) 100-10 MG/5ML syrup Take 5 mLs by mouth 3 (three) times daily as needed for cough. 120 mL Faustino Congress, NP     PDMP not reviewed this encounter.   Faustino Congress, NP 06/02/20 [place]    Faustino Congress, NP 06/02/20 1336

## 2020-06-05 ENCOUNTER — Other Ambulatory Visit (HOSPITAL_COMMUNITY): Payer: Self-pay

## 2020-06-15 ENCOUNTER — Other Ambulatory Visit (HOSPITAL_COMMUNITY): Payer: Self-pay

## 2020-07-16 ENCOUNTER — Other Ambulatory Visit (HOSPITAL_COMMUNITY): Payer: Self-pay

## 2020-07-19 MED FILL — Dulaglutide Soln Auto-injector 1.5 MG/0.5ML: SUBCUTANEOUS | 28 days supply | Qty: 2 | Fill #0 | Status: AC

## 2020-07-20 ENCOUNTER — Other Ambulatory Visit (HOSPITAL_COMMUNITY): Payer: Self-pay

## 2020-07-21 ENCOUNTER — Other Ambulatory Visit (HOSPITAL_COMMUNITY): Payer: Self-pay

## 2020-07-22 ENCOUNTER — Other Ambulatory Visit (HOSPITAL_COMMUNITY): Payer: Self-pay

## 2020-07-23 ENCOUNTER — Other Ambulatory Visit (HOSPITAL_COMMUNITY): Payer: Self-pay

## 2020-07-23 MED FILL — Levothyroxine Sodium Tab 75 MCG: ORAL | 90 days supply | Qty: 90 | Fill #1 | Status: CN

## 2020-07-24 ENCOUNTER — Other Ambulatory Visit (HOSPITAL_COMMUNITY): Payer: Self-pay

## 2020-07-27 DIAGNOSIS — F4323 Adjustment disorder with mixed anxiety and depressed mood: Secondary | ICD-10-CM | POA: Diagnosis not present

## 2020-07-29 ENCOUNTER — Other Ambulatory Visit: Payer: 59 | Admitting: Adult Health

## 2020-07-30 ENCOUNTER — Encounter: Payer: Self-pay | Admitting: Obstetrics & Gynecology

## 2020-07-30 ENCOUNTER — Ambulatory Visit (INDEPENDENT_AMBULATORY_CARE_PROVIDER_SITE_OTHER): Payer: 59 | Admitting: Obstetrics & Gynecology

## 2020-07-30 ENCOUNTER — Other Ambulatory Visit: Payer: 59 | Admitting: Obstetrics & Gynecology

## 2020-07-30 ENCOUNTER — Other Ambulatory Visit (HOSPITAL_COMMUNITY): Payer: Self-pay

## 2020-07-30 ENCOUNTER — Other Ambulatory Visit: Payer: Self-pay

## 2020-07-30 VITALS — BP 136/89 | HR 96 | Ht 65.0 in | Wt 250.0 lb

## 2020-07-30 DIAGNOSIS — N3941 Urge incontinence: Secondary | ICD-10-CM | POA: Diagnosis not present

## 2020-07-30 DIAGNOSIS — Z3041 Encounter for surveillance of contraceptive pills: Secondary | ICD-10-CM

## 2020-07-30 DIAGNOSIS — Z6841 Body Mass Index (BMI) 40.0 and over, adult: Secondary | ICD-10-CM | POA: Diagnosis not present

## 2020-07-30 DIAGNOSIS — N761 Subacute and chronic vaginitis: Secondary | ICD-10-CM

## 2020-07-30 DIAGNOSIS — Z01419 Encounter for gynecological examination (general) (routine) without abnormal findings: Secondary | ICD-10-CM

## 2020-07-30 MED ORDER — BLISOVI 24 FE 1-20 MG-MCG(24) PO TABS
1.0000 | ORAL_TABLET | Freq: Every day | ORAL | 4 refills | Status: DC
  Filled 2020-07-30 – 2020-08-27 (×2): qty 84, 84d supply, fill #0
  Filled 2020-11-19: qty 84, 84d supply, fill #1
  Filled 2021-01-26: qty 84, 84d supply, fill #2
  Filled 2021-04-05: qty 84, 84d supply, fill #3
  Filled 2021-05-11 – 2021-06-17 (×3): qty 84, 84d supply, fill #4

## 2020-07-30 MED ORDER — FLUCONAZOLE 150 MG PO TABS
150.0000 mg | ORAL_TABLET | Freq: Once | ORAL | 2 refills | Status: DC | PRN
  Filled 2020-07-30: qty 6, 6d supply, fill #0

## 2020-07-30 NOTE — Progress Notes (Signed)
WELL-WOMAN EXAMINATION Patient name: Karen Hoover MRN [id number]  Date of birth: [birth year] Chief Complaint:   Gynecologic Exam  History of Present Illness:   Karen Hoover is a 30 y.o. G0P0000 female being seen today for a routine well-woman exam.  Today she notes    1)CHEK2 mutation- genetic counselor seen 10/2017 -Due to increased risk of breast Ca- screening as follows: -at age 71 or 36 years younger than the earliest age of onset -recommended consideration for MRI as part of regular breast cancer screening -colon cancer screening to start @ 28yo  2) AUB- Last month in May- missed one pill, then had period that lasted x 2wks.  She had discussed with Cameron Proud- plan to skip placebos.  She has not yet had a period this month.  Prior to this periods had been regular each month- denies HMB or intermenstrual bleeding.  LMP- 2 wks ago (Menstrual status: Oral contraceptives).  3) Urge incontinence: ongoing issue, per pt she had tried a medication in the past.  Denies stress incontinence. Denies urinary frequency or dysuria.  Notes that she can't hold her pee as long as she used to   The current method of family planning is OCP (estrogen/progesterone).    Last pap 07/2019 neg.  Last mammogram: n/a. Last colonoscopy: n/a  Depression screen [facility] 2/9 07/30/2020 07/17/2019 03/23/2016  Decreased Interest 1 0 0  Down, Depressed, Hopeless 2 0 0  PHQ - 2 Score 3 0 0  Altered sleeping 1 0 -  Tired, decreased energy 1 0 -  Change in appetite 1 1 -  Feeling bad or failure about yourself  1 0 -  Trouble concentrating 1 0 -  Moving slowly or fidgety/restless 1 0 -  Suicidal thoughts 0 0 -  PHQ-9 Score 9 1 -  Difficult doing work/chores - Not difficult at all -      Review of Systems:   Pertinent items are noted in HPI Denies any headaches, blurred vision, fatigue, shortness of breath, chest pain, abdominal pain, bowel movements. Pertinent History Reviewed:  Reviewed past  medical,surgical, social and family history.  Reviewed problem list, medications and allergies. Physical Assessment:   Vitals:   07/30/20 0938  BP: 136/89  Pulse: 96  Weight: 250 lb (113.4 kg)  Height: 5' 5" (1.651 m)  Body mass index is 41.6 kg/m.        Physical Examination:   General appearance - well appearing, and in no distress  Mental status - alert, oriented to person, place, and time  Psych:  She has a normal mood and affect  Skin - warm and dry, normal color, no suspicious lesions noted  Chest - effort normal, all lung fields clear to auscultation bilaterally  Heart - normal rate and regular rhythm  Neck:  midline trachea, no thyromegaly or nodules  Breasts - breasts appear normal, no suspicious masses, no skin or nipple changes or  axillary nodes  Abdomen - obese, soft, nontender, nondistended, no masses or organomegaly  Pelvic - VULVA: normal appearing vulva with no masses, tenderness or lesions  VAGINA: normal appearing vagina- clumpy white discharge noted and seen with vulvar fold, no vaginal lesions  CERVIX: normal appearing cervix without discharge or lesions, no CMT  UTERUS: uterus is felt to be normal size, shape, consistency and nontender , ADNEXA: No adnexal masses or tenderness noted. Bimanual exam limited due to body habitus  Extremities:  No swelling or varicosities noted  Chaperone: Levy Pupa     Assessment &  Plan:  1) Well-Woman Exam -pap up to date, reviewed screening guidelines -pt to find out when mother was diagnosed with breast Ca- will start mammogram 92yrearlier  2) Urge incontinence -reviewed conservative therapy including bladder training and weight loss  3) Contraceptive management -plan to continue with OCPs, ok to skip placebo if desires  4) Recurrent yeast -discussed importance of good sugar control -plan for Diflucan to take as needed  No orders of the defined types were placed in this encounter.   Meds:  Meds ordered this  encounter  Medications   BLISOVI 24 FE 1-20 MG-MCG(24) tablet    Sig: Take 1 tablet by mouth daily. Needs office visit    Dispense:  84 tablet    Refill:  4   fluconazole (DIFLUCAN) 150 MG tablet    Sig: Take 1 tablet (150 mg total) by mouth Once PRN for up to 1 dose.    Dispense:  6 tablet    Refill:  2     Follow-up: Return in about 1 year (around 07/30/2021) for Annual.   JJanyth Pupa DO Attending [place] [facility] [place]

## 2020-08-04 ENCOUNTER — Ambulatory Visit: Payer: 59 | Admitting: Adult Health

## 2020-08-13 ENCOUNTER — Ambulatory Visit: Payer: 59 | Admitting: Adult Health

## 2020-08-14 ENCOUNTER — Other Ambulatory Visit (HOSPITAL_COMMUNITY): Payer: Self-pay

## 2020-08-16 ENCOUNTER — Encounter: Payer: Self-pay | Admitting: Emergency Medicine

## 2020-08-16 ENCOUNTER — Ambulatory Visit (INDEPENDENT_AMBULATORY_CARE_PROVIDER_SITE_OTHER): Payer: 59

## 2020-08-16 ENCOUNTER — Other Ambulatory Visit: Payer: Self-pay

## 2020-08-16 ENCOUNTER — Ambulatory Visit
Admission: EM | Admit: 2020-08-16 | Discharge: 2020-08-16 | Disposition: A | Discharge status: Home / Self Care | Payer: 59 | Attending: Urgent Care | Admitting: Urgent Care

## 2020-08-16 DIAGNOSIS — M79674 Pain in right toe(s): Secondary | ICD-10-CM | POA: Diagnosis not present

## 2020-08-16 DIAGNOSIS — S90121A Contusion of right lesser toe(s) without damage to nail, initial encounter: Secondary | ICD-10-CM

## 2020-08-16 DIAGNOSIS — S92424A Nondisplaced fracture of distal phalanx of right great toe, initial encounter for closed fracture: Secondary | ICD-10-CM | POA: Diagnosis not present

## 2020-08-16 MED ORDER — KETOROLAC TROMETHAMINE 60 MG/2ML IM SOLN
60.0000 mg | Freq: Once | INTRAMUSCULAR | Status: AC
  Administered 2020-08-16: 60 mg via INTRAMUSCULAR

## 2020-08-16 MED ORDER — NAPROXEN 500 MG PO TABS: 500.0000 mg | ORAL_TABLET | Freq: Two times a day (BID) | ORAL | 0 refills | Status: DC

## 2020-08-16 NOTE — ED Triage Notes (Signed)
Hit foot last night on cement.  Right great toe bruised and swollen and painful

## 2020-08-16 NOTE — ED Provider Notes (Signed)
[place]   MRN: [id number] DOB: May 06, 1990  Subjective:   Karen Hoover is a 30 y.o. female presenting for suffering a right great toe injury last night.  Patient states that she was walking and came along with some cement steps, unfortunately stubbed her right great toe against it.  She has had persistent worsening swelling, bruising, moderate pain.  She did take some ibuprofen last night but not this morning.  No lacerations.  Patient is a diabetic managed without insulin.  No current facility-administered medications for this encounter.  Current Outpatient Medications:    albuterol (VENTOLIN HFA) 108 (90 Base) MCG/ACT inhaler, Inhale 2 puffs into the lungs every 8 (eight) hours as needed for wheezing or shortness of breath., Disp: , Rfl:    APPLE CIDER VINEGAR PO, Take 2 tablets by mouth daily., Disp: , Rfl:    aspirin EC 81 MG tablet, Take 81 mg by mouth daily., Disp: , Rfl:    BLISOVI 24 FE 1-20 MG-MCG(24) tablet, Take 1 tablet by mouth daily. Needs office visit, Disp: 84 tablet, Rfl: 4   cetirizine (ZYRTEC ALLERGY) 10 MG tablet, Take 1 tablet (10 mg total) by mouth daily., Disp: 30 tablet, Rfl: 0   Dulaglutide 1.5 MG/0.5ML SOPN, INJECT 1.$RemoveBefore'5MG'[id number]$  SUBCUTANEOUS ONCE A WEEK, Disp: 2 mL, Rfl: 6   Empagliflozin-metFORMIN HCl ER (SYNJARDY XR) 25-1000 MG TB24, Take 1 tablet by mouth daily with breakfast., Disp: 30 tablet, Rfl: 6   escitalopram (LEXAPRO) 20 MG tablet, TAKE 1 TABLET BY MOUTH ONCE DAILY, Disp: 30 tablet, Rfl: 5   Ferrous Sulfate (IRON PO), Take 1 tablet by mouth daily., Disp: , Rfl:    fluconazole (DIFLUCAN) 150 MG tablet, Take 1 tablet (150 mg total) by mouth once as needed for up to one dose, Disp: 6 tablet, Rfl: 2   fluticasone (FLONASE) 50 MCG/ACT nasal spray, Place 2 sprays into both nostrils daily., Disp: 16 g, Rfl: 2   FREESTYLE LITE test strip, 1 each 2 (two) times daily., Disp: , Rfl:    gabapentin (NEURONTIN) 300 MG capsule, Take 300 mg by mouth daily  as needed (pain)., Disp: , Rfl:    guaiFENesin-codeine (ROBITUSSIN AC) 100-10 MG/5ML syrup, Take 5 mLs by mouth 3 (three) times daily as needed for cough., Disp: 120 mL, Rfl: 0   Ibuprofen 200 MG CAPS, Take 800 mg by mouth every 8 (eight) hours as needed for moderate pain. , Disp: , Rfl:    levothyroxine (SYNTHROID) 75 MCG tablet, TAKE 1 TABLET BY MOUTH DAILY EVERY MORNING, Disp: 90 tablet, Rfl: 3   levothyroxine (SYNTHROID) 75 MCG tablet, TAKE 1 TABLET BY MOUTH IN THE MORNING, Disp: 90 tablet, Rfl: 2   methocarbamol (ROBAXIN) 500 MG tablet, Take 1 tablet (500 mg total) by mouth 3 (three) times daily between meals as needed., Disp: 20 tablet, Rfl: 0   Multiple Vitamin (MULTIVITAMIN WITH MINERALS) TABS tablet, Take 1 tablet by mouth daily. OTC, Disp: , Rfl:    OneTouch Delica Lancets 85T MISC, Use to self monitor blood glucose twice daily (DX: ICD10: R73.01), Disp: , Rfl:    sertraline (ZOLOFT) 25 MG tablet, TAKE 1 TABLET BY MOUTH ONCE DAILY., Disp: 90 tablet, Rfl: 1   traMADol (ULTRAM) 50 MG tablet, Take 1 tablet (50 mg total) by mouth every 6 (six) hours as needed., Disp: 6 tablet, Rfl: 0   Zinc Sulfate (ZINC 15 PO), Take by mouth. OTC, Disp: , Rfl:    Allergies  Allergen Reactions   Metformin Hcl Er Other (  See Comments)    Past Medical History:  Diagnosis Date   Anemia    Anxiety 03/17/2015   BV (bacterial vaginosis) 04/10/2014   Chronic lower back pain    2 herniated disc sees Dr Carloyn Manner   Complication of anesthesia    O2 desaturation, Hypotension, Fluid filled lungs   Diabetes mellitus without complication ([place])    Encounter for menstrual regulation 03/13/2014   Family history of breast cancer    Family history of melanoma    Fatty liver 09/05/2018   Gallstones 09/05/2018   Refer to Dr Constance Haw   Hemorrhoids 01/12/2015   History of rectal bleeding 01/12/2015   Hypothyroidism    IC (interstitial cystitis) 01/22/2015   Pulmonary edema 08/21/2015   S/P back OR   Sleep apnea    Thyroid  disease    Vaginal discharge 03/13/2014   Vaginal itching 01/12/2015   Vestibulitis, vulvar 04/21/2014   Yeast infection 03/13/2014     Past Surgical History:  Procedure Laterality Date   BACK SURGERY     CHOLECYSTECTOMY N/A 10/17/2018   Procedure: LAPAROSCOPIC CHOLECYSTECTOMY;  Surgeon: Virl Cagey, MD;  Location: [facility];  Service: General;  Laterality: N/A;   LAMINECTOMY AND MICRODISCECTOMY LUMBAR SPINE Right 08/21/2015   L4-5 and L5-S 1    Family History  Problem Relation Age of Onset   Diabetes Mother    Thyroid disease Mother    Breast cancer Mother 19       CHEK2 pos   Thyroid disease Brother    Diabetes Maternal Grandmother    Melanoma Maternal Grandmother        d. 50   Cancer Paternal Grandmother    Melanoma Maternal Grandfather        d. 61   Kidney cancer Other        MGF's sister   Breast cancer Cousin 77       mother's pat first cousin    Social History   Tobacco Use   Smoking status: Never   Smokeless tobacco: Never  Vaping Use   Vaping Use: Never used  Substance Use Topics   Alcohol use: Yes    Comment: occasionally    Drug use: No    ROS   Objective:   Vitals: BP 116/76 (BP Location: Right Arm)   Pulse 92   Temp 98.1 F (36.7 C) (Oral)   Resp 16   SpO2 98%   Physical Exam Constitutional:      General: She is not in acute distress.    Appearance: Normal appearance. She is well-developed. She is obese. She is not ill-appearing, toxic-appearing or diaphoretic.  HENT:     Head: Normocephalic and atraumatic.     Nose: Nose normal.     Mouth/Throat:     Mouth: Mucous membranes are moist.     Pharynx: Oropharynx is clear.  Eyes:     General: No scleral icterus.    Extraocular Movements: Extraocular movements intact.     Pupils: Pupils are equal, round, and reactive to light.  Cardiovascular:     Rate and Rhythm: Normal rate.  Pulmonary:     Effort: Pulmonary effort is normal.  Musculoskeletal:       Feet:  Skin:    General:  Skin is warm and dry.  Neurological:     General: No focal deficit present.     Mental Status: She is alert and oriented to person, place, and time.  Psychiatric:  Mood and Affect: Mood normal.        Behavior: Behavior normal.        Thought Content: Thought content normal.        Judgment: Judgment normal.    IM Toradol 30m in clinic.   Right foot - no obvious fracture, radiology over-read pending.   Assessment and Plan :   PDMP not reviewed this encounter.  1. Great toe pain, right   2. Traumatic ecchymosis of toe of right foot, initial encounter     Patient is neurovascularly intact. Radiology over-read is pending. Will discuss results with patient once radiology over-read is available. In the meantime, wear post-op shoe, schedule naproxen for pain and inflammation. Follow up with podiatry. Counseled patient on potential for adverse effects with medications prescribed/recommended today, ER and return-to-clinic precautions discussed, patient verbalized understanding.    MJaynee Eagles PA-C 08/16/20 1150

## 2020-08-17 ENCOUNTER — Other Ambulatory Visit (HOSPITAL_COMMUNITY): Payer: Self-pay

## 2020-08-18 ENCOUNTER — Telehealth (HOSPITAL_COMMUNITY): Payer: Self-pay | Admitting: Emergency Medicine

## 2020-08-18 DIAGNOSIS — F4323 Adjustment disorder with mixed anxiety and depressed mood: Secondary | ICD-10-CM | POA: Diagnosis not present

## 2020-08-18 NOTE — Telephone Encounter (Signed)
Per Dr. Chaney Malling "She can buddy tape the toe, stiff soled shoe or cam boot would also be okay. She needs to follow-up with podiatry or orthopedics ASAP because she is adiabetic.  Wear the shoe until she is seen by orthopedics or podiatry. I have no idea whatto write for her for a work note. "   Reviewed with patient, all questions answered, given [facility] for possible follow up

## 2020-08-19 ENCOUNTER — Other Ambulatory Visit: Payer: Self-pay

## 2020-08-19 ENCOUNTER — Ambulatory Visit (INDEPENDENT_AMBULATORY_CARE_PROVIDER_SITE_OTHER): Payer: 59 | Admitting: Podiatry

## 2020-08-19 ENCOUNTER — Encounter: Payer: Self-pay | Admitting: *Deleted

## 2020-08-19 ENCOUNTER — Encounter: Payer: Self-pay | Admitting: Podiatry

## 2020-08-19 DIAGNOSIS — S92424A Nondisplaced fracture of distal phalanx of right great toe, initial encounter for closed fracture: Secondary | ICD-10-CM

## 2020-08-19 NOTE — Progress Notes (Signed)
  Subjective:  Patient ID: Karen Hoover, female    DOB: [birth year],  MRN: [id number]  Chief Complaint  Patient presents with   Fracture      (np) fractured right great toe     30 y.o. female presents with the above complaint. History confirmed with patient.  She injured it on Friday at the drive-in movie theater, went to urgent care had an x-ray taken and has been WBAT in a surgical shoe since then  Objective:  Physical Exam: warm, good capillary refill, no trophic changes or ulcerative lesions, normal DP and PT pulses, and normal sensory exam. Right Foot: Pain and ecchymosis around the great toe most significantly at the IPJ  Radiographs: Reviewed her radiographs from urgent care and agree with the read on them Assessment:   1. Closed nondisplaced fracture of distal phalanx of right great toe, initial encounter      Plan:  Patient was evaluated and treated and all questions answered.  Discussed treatment of toe fractures in detail, recommend she continue WBAT in the surgical shoe and showed her how to splint the toe with a soft splint with Coban.  Reviewed ice and anti-inflammatories as needed.  Wrote a work note for [employer] duty for 4 weeks while she is recuperating.  Hopeful to move to regular shoe gear at next visit and resume regular work activities. Return in about 4 weeks (around 09/16/2020) for fracture f/u, new x-rays.

## 2020-08-24 ENCOUNTER — Other Ambulatory Visit: Payer: Self-pay | Admitting: Pharmacotherapy

## 2020-08-24 ENCOUNTER — Other Ambulatory Visit (HOSPITAL_COMMUNITY): Payer: Self-pay

## 2020-08-24 MED ORDER — ESCITALOPRAM OXALATE 20 MG PO TABS
20.0000 mg | ORAL_TABLET | Freq: Every day | ORAL | 1 refills | Status: DC
  Filled 2020-08-24: qty 90, 90d supply, fill #0

## 2020-08-24 MED FILL — Levothyroxine Sodium Tab 75 MCG: ORAL | 90 days supply | Qty: 90 | Fill #1 | Status: CN

## 2020-08-25 ENCOUNTER — Other Ambulatory Visit (HOSPITAL_COMMUNITY): Payer: Self-pay

## 2020-08-25 MED FILL — Levothyroxine Sodium Tab 75 MCG: ORAL | 90 days supply | Qty: 90 | Fill #1 | Status: AC

## 2020-08-26 ENCOUNTER — Other Ambulatory Visit (HOSPITAL_COMMUNITY): Payer: Self-pay

## 2020-08-26 ENCOUNTER — Other Ambulatory Visit: Payer: Self-pay | Admitting: Pharmacotherapy

## 2020-08-27 ENCOUNTER — Other Ambulatory Visit (HOSPITAL_COMMUNITY): Payer: Self-pay

## 2020-08-28 ENCOUNTER — Other Ambulatory Visit (HOSPITAL_COMMUNITY): Payer: Self-pay

## 2020-09-01 ENCOUNTER — Other Ambulatory Visit (HOSPITAL_COMMUNITY): Payer: Self-pay

## 2020-09-01 MED ORDER — TRULICITY 1.5 MG/0.5ML ~~LOC~~ SOAJ
1.5000 mg | SUBCUTANEOUS | 5 refills | Status: DC
  Filled 2020-09-01: qty 6, 84d supply, fill #0
  Filled 2020-11-10: qty 6, 84d supply, fill #1

## 2020-09-07 DIAGNOSIS — F4323 Adjustment disorder with mixed anxiety and depressed mood: Secondary | ICD-10-CM | POA: Diagnosis not present

## 2020-09-17 ENCOUNTER — Ambulatory Visit (INDEPENDENT_AMBULATORY_CARE_PROVIDER_SITE_OTHER): Payer: 59

## 2020-09-17 ENCOUNTER — Other Ambulatory Visit: Payer: Self-pay

## 2020-09-17 ENCOUNTER — Other Ambulatory Visit (HOSPITAL_COMMUNITY): Payer: Self-pay

## 2020-09-17 ENCOUNTER — Encounter: Payer: Self-pay | Admitting: Podiatry

## 2020-09-17 ENCOUNTER — Ambulatory Visit (INDEPENDENT_AMBULATORY_CARE_PROVIDER_SITE_OTHER): Payer: 59 | Admitting: Podiatry

## 2020-09-17 DIAGNOSIS — S92424D Nondisplaced fracture of distal phalanx of right great toe, subsequent encounter for fracture with routine healing: Secondary | ICD-10-CM

## 2020-09-17 DIAGNOSIS — L600 Ingrowing nail: Secondary | ICD-10-CM

## 2020-09-17 MED ORDER — NEOMYCIN-POLYMYXIN-HC 3.5-10000-1 OT SUSP
OTIC | 0 refills | Status: DC
  Filled 2020-09-17: qty 10, 90d supply, fill #0

## 2020-09-17 NOTE — Patient Instructions (Signed)

## 2020-09-18 ENCOUNTER — Emergency Department (HOSPITAL_COMMUNITY)
Admission: EM | Admit: 2020-09-18 | Discharge: 2020-09-18 | Disposition: A | Discharge status: Home / Self Care | Payer: 59 | Attending: Emergency Medicine | Admitting: Emergency Medicine

## 2020-09-18 ENCOUNTER — Encounter (HOSPITAL_COMMUNITY): Payer: Self-pay

## 2020-09-18 ENCOUNTER — Other Ambulatory Visit (HOSPITAL_COMMUNITY): Payer: Self-pay

## 2020-09-18 DIAGNOSIS — Z7982 Long term (current) use of aspirin: Secondary | ICD-10-CM | POA: Insufficient documentation

## 2020-09-18 DIAGNOSIS — E119 Type 2 diabetes mellitus without complications: Secondary | ICD-10-CM | POA: Diagnosis not present

## 2020-09-18 DIAGNOSIS — Z79899 Other long term (current) drug therapy: Secondary | ICD-10-CM | POA: Insufficient documentation

## 2020-09-18 DIAGNOSIS — E039 Hypothyroidism, unspecified: Secondary | ICD-10-CM | POA: Insufficient documentation

## 2020-09-18 DIAGNOSIS — M79674 Pain in right toe(s): Secondary | ICD-10-CM | POA: Insufficient documentation

## 2020-09-18 DIAGNOSIS — Z7984 Long term (current) use of oral hypoglycemic drugs: Secondary | ICD-10-CM | POA: Diagnosis not present

## 2020-09-18 DIAGNOSIS — M5441 Lumbago with sciatica, right side: Secondary | ICD-10-CM | POA: Insufficient documentation

## 2020-09-18 MED ORDER — METHOCARBAMOL 500 MG PO TABS: 500.0000 mg | ORAL_TABLET | Freq: Two times a day (BID) | ORAL | 0 refills | Status: DC

## 2020-09-18 MED ORDER — LIDOCAINE 5 % EX PTCH
1.0000 | MEDICATED_PATCH | Freq: Once | CUTANEOUS | Status: DC
  Administered 2020-09-18: 1 via TRANSDERMAL
  Filled 2020-09-18: qty 1

## 2020-09-18 MED ORDER — AMOXICILLIN 500 MG PO CAPS: 500.0000 mg | ORAL_CAPSULE | Freq: Three times a day (TID) | ORAL | 0 refills | Status: AC

## 2020-09-18 MED ORDER — OXYCODONE-ACETAMINOPHEN 5-325 MG PO TABS
1.0000 | ORAL_TABLET | Freq: Once | ORAL | Status: AC
  Administered 2020-09-18: 1 via ORAL
  Filled 2020-09-18: qty 1

## 2020-09-18 MED ORDER — HYDROCODONE-ACETAMINOPHEN 5-325 MG PO TABS: 1.0000 | ORAL_TABLET | Freq: Four times a day (QID) | ORAL | 0 refills | Status: DC | PRN

## 2020-09-18 NOTE — ED Provider Notes (Signed)
[facility] EMERGENCY DEPARTMENT Provider Note   CSN: [id number] Arrival date & time: 09/18/20  0818     History Chief Complaint  Patient presents with   Toe Pain    Karen Hoover is a 30 y.o. female with past medical history significant for chronic low back pain, type 2 diabetes.  HPI Patient presents to emergency room today with chief complaint of right great toe pain x1 day.  Patient had an ingrown toenail removed yesterday by podiatrist.  She went to work her overnight shift and thinks that made the pain in her toe worse.  She is reporting sharp throbbing and aching pain in her right great toe.  She is also endorsing right lower back pain that radiates down her right leg.  She admits to history of 2 herniated disks and is wondering if her back pain is acting up.  She states it feels like sciatic pain she has had in the past.  She states she recently broke her right great toe and had been in a boot.  Yesterday was her follow-up appointment for that and was when the ingrown toenail was found.  Patient states she took ibuprofen at 7 PM last night and again at 2 AM this morning without much improvement of her pain.  Patient is worried that the toe might be infected since she is diabetic and was not prescribed any antibiotics.   Denies fevers, weight loss, numbness/weakness of upper and lower extremities, bowel/bladder incontinence, urinary retention, history of cancer, saddle anesthesia, history of back surgery, history of IVDA.   Past Medical History:  Diagnosis Date   Anemia    Anxiety 03/17/2015   BV (bacterial vaginosis) 04/10/2014   Chronic lower back pain    2 herniated disc sees Dr Carloyn Manner   Complication of anesthesia    O2 desaturation, Hypotension, Fluid filled lungs   Diabetes mellitus without complication ([place])    Encounter for menstrual regulation 03/13/2014   Family history of breast cancer    Family history of melanoma    Fatty liver 09/05/2018   Gallstones 09/05/2018   Refer to  Dr Constance Haw   Hemorrhoids 01/12/2015   History of rectal bleeding 01/12/2015   Hypothyroidism    IC (interstitial cystitis) 01/22/2015   Pulmonary edema 08/21/2015   S/P back OR   Sleep apnea    Thyroid disease    Vaginal discharge 03/13/2014   Vaginal itching 01/12/2015   Vestibulitis, vulvar 04/21/2014   Yeast infection 03/13/2014    Patient Active Problem List   Diagnosis Date Noted   Urinary incontinence without sensory awareness 07/17/2019   Encounter for gynecological examination with Papanicolaou smear of cervix 07/17/2019   Encounter for management of menstrual issue 07/17/2019   Calculus of gallbladder without cholecystitis without obstruction 09/05/2018   Fatty liver 09/05/2018   Pelvic pain 08/30/2018   Pain with urination 24/23/5361   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   Family history of breast cancer    Family history of melanoma    Urge incontinence 03/23/2016   Diarrhea 12/16/2015   Change in bowel habits 12/16/2015   Generalized abdominal pain 12/16/2015   Rectal bleeding 12/16/2015   Pulmonary edema 08/21/2015   Anxiety 03/17/2015   IC (interstitial cystitis) 01/22/2015   Vaginal itching 01/12/2015   Hemorrhoids 01/12/2015   History of rectal bleeding 01/12/2015   Vestibulitis, vulvar 04/21/2014   BV (bacterial vaginosis) 04/10/2014   Vaginal discharge 03/13/2014   Yeast infection 03/13/2014   Encounter  for menstrual regulation 03/13/2014    Past Surgical History:  Procedure Laterality Date   BACK SURGERY     CHOLECYSTECTOMY N/A 10/17/2018   Procedure: LAPAROSCOPIC CHOLECYSTECTOMY;  Surgeon: Virl Cagey, MD;  Location: [facility];  Service: General;  Laterality: N/A;   LAMINECTOMY AND MICRODISCECTOMY LUMBAR SPINE Right 08/21/2015   L4-5 and L5-S 1     OB History     Gravida  0   Para  0   Term  0   Preterm  0   AB  0   Living  0      SAB  0   IAB  0   Ectopic  0   Multiple  0   Live Births               Family History  Problem Relation Age of Onset   Diabetes Mother    Thyroid disease Mother    Breast cancer Mother 38       CHEK2 pos   Thyroid disease Brother    Diabetes Maternal Grandmother    Melanoma Maternal Grandmother        d. 52   Cancer Paternal Grandmother    Melanoma Maternal Grandfather        d. 44   Kidney cancer Other        MGF's sister   Breast cancer Cousin 62       mother's pat first cousin    Social History   Tobacco Use   Smoking status: Never   Smokeless tobacco: Never  Vaping Use   Vaping Use: Never used  Substance Use Topics   Alcohol use: Yes    Comment: occasionally    Drug use: No    Home Medications Prior to Admission medications   Medication Sig Start Date End Date Taking? Authorizing Provider  amoxicillin (AMOXIL) 500 MG capsule Take 1 capsule (500 mg total) by mouth 3 (three) times daily for 5 days. 09/21/20 09/26/20 Yes Walisiewicz, Alison Kubicki E, PA-C  HYDROcodone-acetaminophen (NORCO/VICODIN) 5-325 MG tablet Take 1 tablet by mouth every 6 (six) hours as needed for severe pain. 09/18/20  Yes Walisiewicz, Neela Zecca E, PA-C  methocarbamol (ROBAXIN) 500 MG tablet Take 1 tablet (500 mg total) by mouth 2 (two) times daily. 09/18/20  Yes Walisiewicz, Markell Schrier E, PA-C  albuterol (VENTOLIN HFA) 108 (90 Base) MCG/ACT inhaler Inhale 2 puffs into the lungs every 8 (eight) hours as needed for wheezing or shortness of breath.    [provider]  APPLE CIDER VINEGAR PO Take 2 tablets by mouth daily.    [provider]  aspirin EC 81 MG tablet Take 81 mg by mouth daily.    [provider]  BLISOVI 24 FE 1-20 MG-MCG(24) tablet Take 1 tablet by mouth daily. Needs office visit 07/30/20   Janyth Pupa, DO  cetirizine (ZYRTEC ALLERGY) 10 MG tablet Take 1 tablet (10 mg total) by mouth daily. 04/24/19   Avegno, Darrelyn Hillock, FNP  Dulaglutide (TRULICITY) 1.5 DU/2.0UR SOPN Inject 1.5 mg into the skin once a week. 09/01/20      Empagliflozin-metFORMIN HCl ER (SYNJARDY XR) 25-1000 MG TB24 Take 1 tablet by mouth daily with breakfast. 05/14/20     escitalopram (LEXAPRO) 20 MG tablet TAKE 1 TABLET BY MOUTH ONCE DAILY 05/29/19   Derrek Monaco A, NP  escitalopram (LEXAPRO) 20 MG tablet Take 1 tablet (20 mg total) by mouth daily. 08/24/20     Esketamine HCl, 56 MG Dose, (SPRAVATO, 56 MG DOSE,)  28 MG/DEVICE SOPK mornings 11/29/19   [provider]  Ferrous Sulfate (IRON PO) Take 1 tablet by mouth daily.    [provider]  fluconazole (DIFLUCAN) 150 MG tablet Take 1 tablet (150 mg total) by mouth once as needed for up to one dose 07/30/20   Ozan, Jennifer, DO  fluticasone (FLONASE) 50 MCG/ACT nasal spray Place 2 sprays into both nostrils daily. 06/01/20   Joy, Shawn C, PA-C  fluticasone (FLONASE) 50 MCG/ACT nasal spray Place into the nose. 05/10/18   [provider]  FREESTYLE LITE test strip 1 each 2 (two) times daily. 11/20/19   [provider]  gabapentin (NEURONTIN) 300 MG capsule Take 300 mg by mouth daily as needed (pain).    [provider]  guaiFENesin-codeine (ROBITUSSIN AC) 100-10 MG/5ML syrup Take 5 mLs by mouth 3 (three) times daily as needed for cough. 05/30/20   Faustino Congress, NP  Ibuprofen 200 MG CAPS Take 800 mg by mouth every 8 (eight) hours as needed for moderate pain.     [provider]  levothyroxine (SYNTHROID) 75 MCG tablet TAKE 1 TABLET BY MOUTH DAILY EVERY MORNING 02/18/20 02/17/21  Velna Hatchet, MD  levothyroxine (SYNTHROID) 75 MCG tablet TAKE 1 TABLET BY MOUTH IN THE MORNING 05/30/19 05/29/20  Velna Hatchet, MD  levothyroxine (SYNTHROID) 75 MCG tablet Take 1 tablet by mouth every morning. 05/30/19   [provider]  Multiple Vitamin (MULTIVITAMIN WITH MINERALS) TABS tablet Take 1 tablet by mouth daily. OTC    [provider]  Multiple Vitamins-Minerals (ONE-DAILY MULTI-VIT/MINERAL) PACK Take by mouth.    [provider]   naproxen (NAPROSYN) 500 MG tablet Take 1 tablet (500 mg total) by mouth 2 (two) times daily with a meal. 08/16/20   Jaynee Eagles, PA-C  OneTouch Delica Lancets 14H MISC Use to self monitor blood glucose twice daily (DX: ICD10: R73.01) 09/06/17   [provider]  sertraline (ZOLOFT) 25 MG tablet TAKE 1 TABLET BY MOUTH ONCE DAILY. 09/24/19 09/23/20  Velna Hatchet, MD  Zinc Sulfate (ZINC 15 PO) Take by mouth. OTC    [provider]    Allergies    Metformin hcl er  Review of Systems   Review of Systems All other systems are reviewed and are negative for acute change except as noted in the HPI.  Physical Exam Updated Vital Signs BP (!) 143/93 (BP Location: Right Arm)   Pulse 100   Temp 98 F (36.7 C) (Tympanic)   Resp 20   Ht 5' 5"  (1.651 m)   Wt 113.4 kg   LMP 08/24/2020   SpO2 98%   BMI 41.60 kg/m   Physical Exam Vitals and nursing note reviewed.  Constitutional:      Appearance: She is well-developed. She is not ill-appearing or toxic-appearing.  HENT:     Head: Normocephalic and atraumatic.     Nose: Nose normal.  Eyes:     General: No scleral icterus.       Right eye: No discharge.        Left eye: No discharge.     Conjunctiva/sclera: Conjunctivae normal.  Neck:     Vascular: No JVD.  Cardiovascular:     Rate and Rhythm: Normal rate and regular rhythm.     Pulses: Normal pulses.          Radial pulses are 2+ on the right side and 2+ on the left side.     Heart sounds: Normal heart sounds.  Pulmonary:  Effort: Pulmonary effort is normal.     Breath sounds: Normal breath sounds.  Abdominal:     General: There is no distension.  Musculoskeletal:        General: Normal range of motion.     Cervical back: Normal range of motion.       Back:     Right lower leg: No edema.     Left lower leg: No edema.     Comments: Tenderness to palpation as depicted in image above.  No overlying skin changes.  No midline tenderness.  Full range of motion of  back.  Feet:     Comments: Coban removed from right great toe.  There are no signs of infection.  No purulent drainage, no surrounding erythema.  No active bleeding. Skin:    General: Skin is warm and dry.  Neurological:     Mental Status: She is oriented to person, place, and time.     GCS: GCS eye subscore is 4. GCS verbal subscore is 5. GCS motor subscore is 6.     Comments: Sensation grossly intact to light touch in the lower extremities bilaterally. No saddle anesthesias. Strength 5/5 with flexion and extension at the bilateral hips, knees, and ankles. No noted gait deficit. Coordination intact with heel to shin testing.  Psychiatric:        Behavior: Behavior normal.    ED Results / Procedures / Treatments   Labs (all labs ordered are listed, but only abnormal results are displayed) Labs Reviewed - No data to display  EKG None  Radiology DG Foot Complete Right  Result Date: 09/17/2020 Please see detailed radiograph report in office note.   Procedures Procedures   Medications Ordered in ED Medications  lidocaine (LIDODERM) 5 % 1 patch (1 patch Transdermal Patch Applied 09/18/20 1038)  oxyCODONE-acetaminophen (PERCOCET/ROXICET) 5-325 MG per tablet 1 tablet (1 tablet Oral Given 09/18/20 4944)    ED Course  I have reviewed the triage vital signs and the nursing notes.  Pertinent labs & imaging results that were available during my care of the patient were reviewed by me and considered in my medical decision making (see chart for details).    MDM Rules/Calculators/A&P                           History provided by patient with additional history obtained from chart review.    Patient with right lower back pain and right great toe pain after toenail removal yesterday secondary to ingrown toenail.  Patient is afebrile on arrival, she does look uncomfortable although nontoxic appearing.  She denies any chance of pregnancy.  Percocet given for pain.  Once pain improved as  able to examine the patient and there are no signs of infection to the great toe.  She is also endorsing back pain, lower extremity neuro exam was normal.  No red flags for back pain.  Patient given lidocaine patch here for pain. Patient is very concerned about infection given her history of diabetes.  At this time there is no signs of infection of great toe.  Will give watch and wait prescription for amoxicillin.  Recommend patient wait another 48 hours prior to starting this and discussed signs infection to watch for.  We will also discharge with short course of Norco for severe pain as well as Robaxin for her exam findings suggest sciatica. Recommend ibuprofen for moderate pain at home. Recommend follow up with pcp and  podiatry. Patient is agreeable with plan of care.   Portions of this note were generated with [employer]. Dictation errors may occur despite best attempts at proofreading.  Final Clinical Impression(s) / ED Diagnoses Final diagnoses:  Pain of toe of right foot  Acute right-sided low back pain with right-sided sciatica    Rx / DC Orders ED Discharge Orders          Ordered    amoxicillin (AMOXIL) 500 MG capsule  3 times daily        09/18/20 1040    methocarbamol (ROBAXIN) 500 MG tablet  2 times daily        09/18/20 1035    HYDROcodone-acetaminophen (NORCO/VICODIN) 5-325 MG tablet  Every 6 hours PRN        09/18/20 1042             Barrie Folk, PA-C 09/18/20 1050    Milton Ferguson, MD 09/20/20 [id number]

## 2020-09-18 NOTE — ED Triage Notes (Signed)
Had toe nail removed yesterday and is having pain in the area. She feels it is infected, worked last night and it having pain.  Right large toe 10/10

## 2020-09-18 NOTE — ED Notes (Signed)
Pt left prior to receiving d/c instructions.

## 2020-09-18 NOTE — Discharge Instructions (Addendum)
-  Prescription for amoxicillin sent to the pharmacy.  This antibiotic used to treat infection.  Wait until Monday to start taking this to see if you actually need it.  Prescription sent for Robaxin.  This is a muscle relaxer to help with your back pain.  Do not take if going to be working or driving as it can make you drowsy.  -Prescription sent for Norco.  This is narcotic pain medicine for severe pain only.  It can make you drowsy so do not drive or work while taking it.  Take it with food so does not cause an upset stomach.  Continue take Tylenol and ibuprofen at home to help with mild to moderate pain.  You should take this over the next several days to stay on top of your pain since he has had a procedure.  -Follow-up with the podiatrist for recheck if needed.

## 2020-09-21 NOTE — Progress Notes (Signed)
  Subjective:  Patient ID: Karen Hoover, female    DOB: 02-22-1990,  MRN: [id number]  Chief Complaint  Patient presents with   Fracture    Follow up closed nondisplaced fracture of distal phalanx of right great toe    30 y.o. female returns for follow-up with the above complaint. History confirmed with patient.  She has been splinting the toe and wearing the surgical shoe is feeling overall better pain is reduced  Objective:  Physical Exam: warm, good capillary refill, no trophic changes or ulcerative lesions, normal DP and PT pulses, and normal sensory exam. Right Foot: No pain with palpation of the distal phalanx, no ecchymosis edema is improving but she is an ingrown toenail of the hallux  Radiographs: New multiple view radiographs today showed maintained alignment and bone callus formation Assessment:   1. Closed nondisplaced fracture of distal phalanx of right great toe with routine healing, subsequent encounter   2. Ingrowing right great toenail      Plan:  Patient was evaluated and treated and all questions answered.  Overall doing well with the fracture is not completely healed but should be stable enough she can return to regular shoe gear as tolerated at this point.  Follow-up as needed for this if it gives her further issues     Ingrown Nail, right -Patient elects to proceed with minor surgery to remove ingrown toenail today. Consent reviewed and signed by patient. -Ingrown nail excised. See procedure note. -Educated on post-procedure care including soaking. Written instructions provided and reviewed. -Temporary avulsion performed today if it recurs would favor a matricectomy permanent procedure  Procedure: Excision of Ingrown Toenail Location: Right 1st toe  nail . Anesthesia: Lidocaine 1% plain; 1.5 mL and Marcaine 0.5% plain; 1.5 mL, digital block. Skin Prep: Betadine. Dressing: Silvadene; telfa; dry, sterile, compression dressing. Technique: Following skin  prep, the toe was exsanguinated and a tourniquet was secured at the base of the toe. The affected nail border was freed, split with a nail splitter, and excised.  The tourniquet was then removed and sterile dressing applied. Disposition: Patient tolerated procedure well. Patient to return in 2 weeks for follow-up.   Return if symptoms worsen or fail to improve.

## 2020-09-23 ENCOUNTER — Other Ambulatory Visit (HOSPITAL_COMMUNITY): Payer: Self-pay

## 2020-09-23 DIAGNOSIS — E1169 Type 2 diabetes mellitus with other specified complication: Secondary | ICD-10-CM | POA: Diagnosis not present

## 2020-09-23 DIAGNOSIS — F331 Major depressive disorder, recurrent, moderate: Secondary | ICD-10-CM | POA: Diagnosis not present

## 2020-09-23 DIAGNOSIS — F4323 Adjustment disorder with mixed anxiety and depressed mood: Secondary | ICD-10-CM | POA: Diagnosis not present

## 2020-09-23 MED ORDER — GLUCOSE BLOOD VI STRP
ORAL_STRIP | 3 refills | Status: DC
  Filled 2020-09-23: qty 100, 50d supply, fill #0

## 2020-09-24 ENCOUNTER — Other Ambulatory Visit (HOSPITAL_COMMUNITY): Payer: Self-pay

## 2020-09-24 MED ORDER — FREESTYLE LITE TEST VI STRP
ORAL_STRIP | 5 refills | Status: AC
  Filled 2020-09-24 – 2021-04-05 (×2): qty 100, 25d supply, fill #0
  Filled 2021-06-16: qty 100, 25d supply, fill #1

## 2020-09-24 MED ORDER — FREESTYLE LANCETS MISC
1.0000 | Freq: Four times a day (QID) | 6 refills | Status: DC
  Filled 2020-09-24 (×2): qty 100, 25d supply, fill #0

## 2020-09-28 ENCOUNTER — Other Ambulatory Visit (HOSPITAL_COMMUNITY): Payer: Self-pay

## 2020-09-28 MED ORDER — FREESTYLE LITE TEST VI STRP: ORAL_STRIP | 5 refills | Status: DC

## 2020-09-28 MED ORDER — FREESTYLE LITE W/DEVICE KIT
PACK | 0 refills | Status: AC
  Filled 2020-09-28: qty 1, 1d supply, fill #0

## 2020-09-28 MED ORDER — FREESTYLE LANCETS MISC: 5 refills | Status: AC

## 2020-09-30 ENCOUNTER — Other Ambulatory Visit (HOSPITAL_COMMUNITY): Payer: Self-pay

## 2020-10-01 ENCOUNTER — Other Ambulatory Visit (HOSPITAL_COMMUNITY): Payer: Self-pay

## 2020-10-01 MED ORDER — METHOCARBAMOL 500 MG PO TABS
500.0000 mg | ORAL_TABLET | Freq: Three times a day (TID) | ORAL | 0 refills | Status: AC
  Filled 2020-10-01: qty 90, 30d supply, fill #0

## 2020-10-06 ENCOUNTER — Other Ambulatory Visit (HOSPITAL_COMMUNITY): Payer: Self-pay

## 2020-10-07 DIAGNOSIS — D72829 Elevated white blood cell count, unspecified: Secondary | ICD-10-CM | POA: Diagnosis not present

## 2020-10-07 DIAGNOSIS — F331 Major depressive disorder, recurrent, moderate: Secondary | ICD-10-CM | POA: Diagnosis not present

## 2020-10-08 ENCOUNTER — Other Ambulatory Visit (HOSPITAL_COMMUNITY): Payer: Self-pay

## 2020-10-08 DIAGNOSIS — M545 Low back pain, unspecified: Secondary | ICD-10-CM | POA: Diagnosis not present

## 2020-10-08 DIAGNOSIS — E1169 Type 2 diabetes mellitus with other specified complication: Secondary | ICD-10-CM | POA: Diagnosis not present

## 2020-10-08 DIAGNOSIS — M5416 Radiculopathy, lumbar region: Secondary | ICD-10-CM | POA: Diagnosis not present

## 2020-10-08 MED ORDER — HYDROCODONE-ACETAMINOPHEN 5-325 MG PO TABS
1.0000 | ORAL_TABLET | Freq: Three times a day (TID) | ORAL | 0 refills | Status: DC
  Filled 2020-10-08: qty 15, 5d supply, fill #0

## 2020-10-09 DIAGNOSIS — F4323 Adjustment disorder with mixed anxiety and depressed mood: Secondary | ICD-10-CM | POA: Diagnosis not present

## 2020-10-19 ENCOUNTER — Other Ambulatory Visit (HOSPITAL_COMMUNITY): Payer: Self-pay

## 2020-10-20 DIAGNOSIS — F4323 Adjustment disorder with mixed anxiety and depressed mood: Secondary | ICD-10-CM | POA: Diagnosis not present

## 2020-10-26 DIAGNOSIS — F4323 Adjustment disorder with mixed anxiety and depressed mood: Secondary | ICD-10-CM | POA: Diagnosis not present

## 2020-11-02 DIAGNOSIS — F4323 Adjustment disorder with mixed anxiety and depressed mood: Secondary | ICD-10-CM | POA: Diagnosis not present

## 2020-11-09 DIAGNOSIS — F4323 Adjustment disorder with mixed anxiety and depressed mood: Secondary | ICD-10-CM | POA: Diagnosis not present

## 2020-11-10 ENCOUNTER — Other Ambulatory Visit (HOSPITAL_COMMUNITY): Payer: Self-pay

## 2020-11-11 DIAGNOSIS — E1169 Type 2 diabetes mellitus with other specified complication: Secondary | ICD-10-CM | POA: Diagnosis not present

## 2020-11-11 DIAGNOSIS — E039 Hypothyroidism, unspecified: Secondary | ICD-10-CM | POA: Diagnosis not present

## 2020-11-16 DIAGNOSIS — F4323 Adjustment disorder with mixed anxiety and depressed mood: Secondary | ICD-10-CM | POA: Diagnosis not present

## 2020-11-18 ENCOUNTER — Other Ambulatory Visit (HOSPITAL_COMMUNITY): Payer: Self-pay

## 2020-11-18 DIAGNOSIS — E039 Hypothyroidism, unspecified: Secondary | ICD-10-CM | POA: Diagnosis not present

## 2020-11-18 DIAGNOSIS — Z0000 Encounter for general adult medical examination without abnormal findings: Secondary | ICD-10-CM | POA: Diagnosis not present

## 2020-11-18 DIAGNOSIS — Z1339 Encounter for screening examination for other mental health and behavioral disorders: Secondary | ICD-10-CM | POA: Diagnosis not present

## 2020-11-18 DIAGNOSIS — E538 Deficiency of other specified B group vitamins: Secondary | ICD-10-CM | POA: Diagnosis not present

## 2020-11-18 DIAGNOSIS — G4733 Obstructive sleep apnea (adult) (pediatric): Secondary | ICD-10-CM | POA: Diagnosis not present

## 2020-11-18 DIAGNOSIS — M5416 Radiculopathy, lumbar region: Secondary | ICD-10-CM | POA: Diagnosis not present

## 2020-11-18 DIAGNOSIS — Z1331 Encounter for screening for depression: Secondary | ICD-10-CM | POA: Diagnosis not present

## 2020-11-18 DIAGNOSIS — F331 Major depressive disorder, recurrent, moderate: Secondary | ICD-10-CM | POA: Diagnosis not present

## 2020-11-18 DIAGNOSIS — Z23 Encounter for immunization: Secondary | ICD-10-CM | POA: Diagnosis not present

## 2020-11-18 DIAGNOSIS — D72829 Elevated white blood cell count, unspecified: Secondary | ICD-10-CM | POA: Diagnosis not present

## 2020-11-18 DIAGNOSIS — E1169 Type 2 diabetes mellitus with other specified complication: Secondary | ICD-10-CM | POA: Diagnosis not present

## 2020-11-18 MED ORDER — SYNJARDY XR 25-1000 MG PO TB24
1.0000 | ORAL_TABLET | Freq: Every day | ORAL | 3 refills | Status: DC
  Filled 2020-11-18: qty 90, 90d supply, fill #0
  Filled 2021-02-09: qty 90, 90d supply, fill #1
  Filled 2021-05-11: qty 90, 90d supply, fill #2
  Filled 2021-07-26: qty 90, 90d supply, fill #3

## 2020-11-18 MED ORDER — LEVOTHYROXINE SODIUM 100 MCG PO TABS
100.0000 ug | ORAL_TABLET | Freq: Every morning | ORAL | 3 refills | Status: DC
  Filled 2020-11-18: qty 90, 90d supply, fill #0
  Filled 2021-02-09: qty 90, 90d supply, fill #1
  Filled 2021-05-11: qty 90, 90d supply, fill #2
  Filled 2021-07-26: qty 90, 90d supply, fill #3

## 2020-11-18 MED ORDER — ESCITALOPRAM OXALATE 20 MG PO TABS
20.0000 mg | ORAL_TABLET | Freq: Every day | ORAL | 3 refills | Status: DC
  Filled 2020-11-18: qty 90, 90d supply, fill #0
  Filled 2021-02-09: qty 90, 90d supply, fill #1
  Filled 2021-05-11: qty 90, 90d supply, fill #2
  Filled 2021-07-26: qty 90, 90d supply, fill #3

## 2020-11-19 ENCOUNTER — Other Ambulatory Visit (HOSPITAL_COMMUNITY): Payer: Self-pay

## 2020-11-23 DIAGNOSIS — F4323 Adjustment disorder with mixed anxiety and depressed mood: Secondary | ICD-10-CM | POA: Diagnosis not present

## 2020-11-25 DIAGNOSIS — H52203 Unspecified astigmatism, bilateral: Secondary | ICD-10-CM | POA: Diagnosis not present

## 2020-11-25 DIAGNOSIS — H5213 Myopia, bilateral: Secondary | ICD-10-CM | POA: Diagnosis not present

## 2020-11-30 DIAGNOSIS — F4323 Adjustment disorder with mixed anxiety and depressed mood: Secondary | ICD-10-CM | POA: Diagnosis not present

## 2020-11-30 DIAGNOSIS — G4733 Obstructive sleep apnea (adult) (pediatric): Secondary | ICD-10-CM | POA: Diagnosis not present

## 2020-12-03 ENCOUNTER — Ambulatory Visit: Payer: 59 | Admitting: Adult Health

## 2020-12-07 ENCOUNTER — Ambulatory Visit: Payer: 59 | Admitting: Adult Health

## 2020-12-08 DIAGNOSIS — F4323 Adjustment disorder with mixed anxiety and depressed mood: Secondary | ICD-10-CM | POA: Diagnosis not present

## 2020-12-08 DIAGNOSIS — Z760 Encounter for issue of repeat prescription: Secondary | ICD-10-CM | POA: Diagnosis not present

## 2020-12-09 NOTE — Progress Notes (Signed)
[facility] [address], [place] [address]   CLINIC:  Medical Oncology/Hematology  Patient Care Team: Velna Hatchet, MD as PCP - General (Internal Medicine) Derek Jack, MD as Medical Oncologist (Hematology)  CHIEF COMPLAINTS/PURPOSE OF CONSULTATION:  Evaluation of leukocytosis  HISTORY OF PRESENTING ILLNESS:  Karen Hoover 30 y.o. female is here because of evaluation of leukocytosis, at the request of Dr. Ardeth Perfect.  Today she reports feeling well. She denies fever and ankle swellings, and she is intentionally losing weight about 15 pounds. She broker her big toe in July, but she denies any associated infections. She reports drenching night sweats especially from the waist down starting 2 months ago and occurring about once a week. She denies any recent steroid use and splenectomy. She denies history of lupus and RA. She is currently taking birth control pills. Her menses are typically regular lasting 2-3 days, but she reports she had spotting 1 week early with her latest menses. She is not currently taking iron tablets. She denies any dental infections. She currently works as a Marine scientist. She denies smoking history. Her mother had breast cancer, and her maternal great grandmother had an unspecified cancer.   MEDICAL HISTORY:  Past Medical History:  Diagnosis Date   Anemia    Anxiety 03/17/2015   BV (bacterial vaginosis) 04/10/2014   Chronic lower back pain    2 herniated disc sees Dr Carloyn Manner   Complication of anesthesia    O2 desaturation, Hypotension, Fluid filled lungs   Diabetes mellitus without complication ([place])    Encounter for menstrual regulation 03/13/2014   Family history of breast cancer    Family history of melanoma    Fatty liver 09/05/2018   Gallstones 09/05/2018   Refer to Dr Constance Haw   Hemorrhoids 01/12/2015   History of rectal bleeding 01/12/2015   Hypothyroidism    IC (interstitial cystitis) 01/22/2015   Pulmonary edema 08/21/2015   S/P back  OR   Sleep apnea    Thyroid disease    Vaginal discharge 03/13/2014   Vaginal itching 01/12/2015   Vestibulitis, vulvar 04/21/2014   Yeast infection 03/13/2014    SURGICAL HISTORY: Past Surgical History:  Procedure Laterality Date   BACK SURGERY     CHOLECYSTECTOMY N/A 10/17/2018   Procedure: LAPAROSCOPIC CHOLECYSTECTOMY;  Surgeon: Virl Cagey, MD;  Location: [facility];  Service: General;  Laterality: N/A;   LAMINECTOMY AND MICRODISCECTOMY LUMBAR SPINE Right 08/21/2015   L4-5 and L5-S 1    SOCIAL HISTORY: Social History   Socioeconomic History   Marital status: Single    Spouse name: Not on file   Number of children: Not on file   Years of education: Not on file   Highest education level: Not on file  Occupational History   Occupation: Surgical Instrument cleaner    Employer: [employer]  Tobacco Use   Smoking status: Never   Smokeless tobacco: Never  Vaping Use   Vaping Use: Never used  Substance and Sexual Activity   Alcohol use: Yes    Comment: occasionally    Drug use: No   Sexual activity: Not Currently    Birth control/protection: Pill, Condom  Other Topics Concern   Not on file  Social History Narrative   Not on file   Social Determinants of Health   Financial Resource Strain: Low Risk    Difficulty of Paying Living Expenses: Not very hard  Food Insecurity: No Food Insecurity   Worried About Running Out of  Food in the Last Year: Never true   [place] in the Last Year: Never true  Transportation Needs: No Transportation Needs   Lack of Transportation (Medical): No   Lack of Transportation (Non-Medical): No  Physical Activity: Insufficiently Active   Days of Exercise per Week: 1 day   Minutes of Exercise per Session: 60 min  Stress: Stress Concern Present   Feeling of Stress : To some extent  Social Connections: Moderately Integrated   Frequency of Communication with Friends and Family: More than three times a week   Frequency of Social  Gatherings with Friends and Family: Three times a week   Attends Religious Services: More than 4 times per year   Active Member of Clubs or Organizations: Yes   Attends Music therapist: More than 4 times per year   Marital Status: Never married  Human resources officer Violence: Not At Risk   Fear of Current or Ex-Partner: No   Emotionally Abused: No   Physically Abused: No   Sexually Abused: No    FAMILY HISTORY: Family History  Problem Relation Age of Onset   Diabetes Mother    Thyroid disease Mother    Breast cancer Mother 1       CHEK2 pos   Thyroid disease Brother    Diabetes Maternal Grandmother    Melanoma Maternal Grandmother        d. 108   Cancer Paternal Grandmother    Melanoma Maternal Grandfather        d. 60   Kidney cancer Other        MGF's sister   Breast cancer Cousin 35       mother's pat first cousin    ALLERGIES:  is allergic to metformin hcl er.  MEDICATIONS:  Current Outpatient Medications  Medication Sig Dispense Refill   albuterol (VENTOLIN HFA) 108 (90 Base) MCG/ACT inhaler Inhale 2 puffs into the lungs every 8 (eight) hours as needed for wheezing or shortness of breath.     APPLE CIDER VINEGAR PO Take 2 tablets by mouth daily.     aspirin EC 81 MG tablet Take 81 mg by mouth daily.     BLISOVI 24 FE 1-20 MG-MCG(24) tablet Take 1 tablet by mouth daily. Needs office visit 84 tablet 4   Blood Glucose Monitoring Suppl (FREESTYLE LITE) w/Device KIT Use as directed to test 4 times daily 1 kit 0   cetirizine (ZYRTEC ALLERGY) 10 MG tablet Take 1 tablet (10 mg total) by mouth daily. 30 tablet 0   Cyanocobalamin (VITAMIN B-12) 1000 MCG SUBL Place 1 tablet under the tongue.     Dulaglutide (TRULICITY) 1.5 YF/7.4BS SOPN Inject 1.5 mg into the skin once a week. 2 mL 5   Empagliflozin-metFORMIN HCl ER (SYNJARDY XR) 25-1000 MG TB24 Take 1 tablet by mouth daily with breakfast. 30 tablet 6   Empagliflozin-metFORMIN HCl ER (SYNJARDY XR) 25-1000 MG TB24  Take 1 tablet by mouth daily with breakfast. 90 tablet 3   escitalopram (LEXAPRO) 20 MG tablet TAKE 1 TABLET BY MOUTH ONCE DAILY 30 tablet 5   escitalopram (LEXAPRO) 20 MG tablet Take 1 tablet (20 mg total) by mouth daily. 90 tablet 1   escitalopram (LEXAPRO) 20 MG tablet Take 1 tablet (20 mg total) by mouth daily. 90 tablet 3   Esketamine HCl, 56 MG Dose, (SPRAVATO, 56 MG DOSE,) 28 MG/DEVICE SOPK mornings     Ferrous Sulfate (IRON PO) Take 1 tablet by mouth daily.  fluconazole (DIFLUCAN) 150 MG tablet Take 1 tablet (150 mg total) by mouth once as needed for up to one dose 6 tablet 2   fluticasone (FLONASE) 50 MCG/ACT nasal spray Place 2 sprays into both nostrils daily. 16 g 2   fluticasone (FLONASE) 50 MCG/ACT nasal spray Place into the nose.     FREESTYLE LITE test strip 1 each 2 (two) times daily.     gabapentin (NEURONTIN) 300 MG capsule Take 300 mg by mouth daily as needed (pain).     glucose blood (FREESTYLE LITE) test strip Use to test blood sugar 4 times daily 100 strip 5   glucose blood (FREESTYLE LITE) test strip Use to test blood sugars 4 times daily 400 strip 5   glucose blood test strip Use to test 2 times daily. 100 each 3   guaiFENesin-codeine (ROBITUSSIN AC) 100-10 MG/5ML syrup Take 5 mLs by mouth 3 (three) times daily as needed for cough. 120 mL 0   HYDROcodone-acetaminophen (NORCO/VICODIN) 5-325 MG tablet Take 1 tablet by mouth every 6 (six) hours as needed for severe pain. 8 tablet 0   HYDROcodone-acetaminophen (NORCO/VICODIN) 5-325 MG tablet Take 1 tablet by mouth 3 (three) times daily for 5 days as needed for severe pain. 15 tablet 0   Ibuprofen 200 MG CAPS Take 800 mg by mouth every 8 (eight) hours as needed for moderate pain.      Lancets (FREESTYLE) lancets Use to test blood sugar 4 times daily. 100 each 6   Lancets (FREESTYLE) lancets Use to test blood sugar 4 times daily 400 each 5   levothyroxine (SYNTHROID) 100 MCG tablet Take 1 tablet (100 mcg total) by mouth in  the morning on an empty stomach 90 tablet 3   levothyroxine (SYNTHROID) 75 MCG tablet TAKE 1 TABLET BY MOUTH DAILY EVERY MORNING 90 tablet 3   levothyroxine (SYNTHROID) 75 MCG tablet Take 1 tablet by mouth every morning.     methocarbamol (ROBAXIN) 500 MG tablet Take 1 tablet (500 mg total) by mouth 2 (two) times daily. 10 tablet 0   methocarbamol (ROBAXIN) 500 MG tablet Take 1 tablet (500 mg total) by mouth every 8 (eight) hours. 90 tablet 0   Multiple Vitamin (MULTIVITAMIN WITH MINERALS) TABS tablet Take 1 tablet by mouth daily. OTC     Multiple Vitamins-Minerals (ONE-DAILY MULTI-VIT/MINERAL) PACK Take by mouth.     naproxen (NAPROSYN) 500 MG tablet Take 1 tablet (500 mg total) by mouth 2 (two) times daily with a meal. 30 tablet 0   OneTouch Delica Lancets 16P MISC Use to self monitor blood glucose twice daily (DX: ICD10: R73.01)     Zinc Sulfate (ZINC 15 PO) Take by mouth. OTC     levothyroxine (SYNTHROID) 75 MCG tablet TAKE 1 TABLET BY MOUTH IN THE MORNING 90 tablet 2   sertraline (ZOLOFT) 25 MG tablet TAKE 1 TABLET BY MOUTH ONCE DAILY. 90 tablet 1   No current facility-administered medications for this visit.    REVIEW OF SYSTEMS:   Review of Systems  Constitutional:  Positive for fatigue (50%). Negative for appetite change (75%), fever and unexpected weight change (-15 lbs).  Cardiovascular:  Negative for leg swelling.  Genitourinary:  Negative for menstrual problem.   Psychiatric/Behavioral:  Positive for sleep disturbance.   All other systems reviewed and are negative.   PHYSICAL EXAMINATION: ECOG PERFORMANCE STATUS: 0 - Asymptomatic  Vitals:   12/11/20 0835  BP: 135/76  Pulse: 87  Resp: 18  Temp: (!) 97.1 F (36.2 C)  SpO2: 100%   Filed Weights   12/11/20 0835  Weight: 246 lb 12.8 oz (111.9 kg)   Physical Exam Vitals reviewed.  Constitutional:      Appearance: Normal appearance. She is obese.  Cardiovascular:     Rate and Rhythm: Normal rate and regular rhythm.      Pulses: Normal pulses.     Heart sounds: Normal heart sounds.  Pulmonary:     Effort: Pulmonary effort is normal.     Breath sounds: Normal breath sounds.  Abdominal:     Palpations: Abdomen is soft. There is no hepatomegaly, splenomegaly or mass.     Tenderness: There is no abdominal tenderness.  Musculoskeletal:     Right lower leg: No edema.     Left lower leg: No edema.  Lymphadenopathy:     Cervical: No cervical adenopathy.     Right cervical: No superficial cervical adenopathy.    Left cervical: No superficial cervical adenopathy.     Upper Body:     Right upper body: No supraclavicular, axillary or pectoral adenopathy.     Left upper body: No supraclavicular, axillary or pectoral adenopathy.     Lower Body: No right inguinal adenopathy. No left inguinal adenopathy.  Neurological:     General: No focal deficit present.     Mental Status: She is alert and oriented to person, place, and time.  Psychiatric:        Mood and Affect: Mood normal.        Behavior: Behavior normal.     LABORATORY DATA:  I have reviewed the data as listed Recent Results (from the past 2160 hour(s))  Lactate dehydrogenase     Status: None   Collection Time: 12/11/20  9:04 AM  Result Value Ref Range   LDH 115 98 - 192 U/L    Comment: Performed at [facility], [address], [place], [place] [address]  CBC with Differential     Status: Abnormal (Preliminary result)   Collection Time: 12/11/20  9:04 AM  Result Value Ref Range   WBC 16.3 (H) 4.0 - 10.5 K/uL   RBC 5.37 (H) 3.87 - 5.11 MIL/uL   Hemoglobin 13.0 12.0 - 15.0 g/dL   HCT 42.2 36.0 - 46.0 %   MCV 78.6 (L) 80.0 - 100.0 fL   MCH 24.2 (L) 26.0 - 34.0 pg   MCHC 30.8 30.0 - 36.0 g/dL   RDW 15.9 (H) 11.5 - 15.5 %   Platelets 497 (H) 150 - 400 K/uL   nRBC 0.0 0.0 - 0.2 %   Neutrophils Relative % 70 %   Neutro Abs 11.4 (H) 1.7 - 7.7 K/uL   Lymphocytes Relative 26 %   Lymphs Abs 4.2 (H) 0.7 - 4.0 K/uL   Monocytes Relative 4 %    Monocytes Absolute 0.7 0.1 - 1.0 K/uL   Eosinophils Relative 0 %   Eosinophils Absolute 0.0 0.0 - 0.5 K/uL   Basophils Relative 0 %   Basophils Absolute 0.0 0.0 - 0.1 K/uL   WBC Morphology TOXIC GRANULATION     Comment: VACUOLATED NEUTROPHILS   RBC Morphology MORPHOLOGY UNREMARKABLE    Smear Review MORPHOLOGY UNREMARKABLE    Abs Immature Granulocytes PENDING 0.00 - 0.07 K/uL   Reactive, Benign Lymphocytes PRESENT     Comment: Performed at [facility], [address] [place], [place] [address]    RADIOGRAPHIC STUDIES: I have personally reviewed the radiological images as listed and agreed with the findings in the report. No results  found.  ASSESSMENT:  Leukocytosis: - She is evaluated for leukocytosis at least since July 2017.  It was predominantly neutrophilic leukocytosis with occasional high absolute lymphocyte count. - Denies any fevers.  She has occasional sweats, averaging once per week, waist down, for the past 2 months, during daytime when she sleeps.  She usually works at night.  She lost about 15 pounds since April and was trying to lose weight. - She is not on any steroids.  No history of splenectomy. - She denies any recurrent infections.  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  She is a non-smoker. - Mother had breast cancer.  Maternal great grandmother had cancer.   PLAN:  Leukocytosis: - She had nonspecific leukocytosis with elevated lymphocytes and neutrophils. - We will repeat her CBC with differential today, LDH.  We will check for myeloproliferative disorders including BCR/ABL by FISH and JAK2 reflex testing. - We will also do flow cytometry.  She was told to check her blood sugars when she gets sweating episodes. - RTC 2 weeks for follow-up.  2.  Microcytosis: - Labs sent from her PMDs office dated 11/11/2020 shows hemoglobin 12.7 with MCV 74. - We will check ferritin, L45, folic acid and iron panel.   All questions were  answered. The patient knows to call the clinic with any problems, questions or concerns.  Derek Jack, MD 12/11/20 10:08 AM  [facility] [phone]   I, Thana Ates, am acting as a scribe for Dr. Derek Jack.  I, Derek Jack MD, have reviewed the above documentation for accuracy and completeness, and I agree with the above.

## 2020-12-11 ENCOUNTER — Other Ambulatory Visit: Payer: Self-pay

## 2020-12-11 ENCOUNTER — Inpatient Hospital Stay (HOSPITAL_COMMUNITY): Payer: 59

## 2020-12-11 ENCOUNTER — Inpatient Hospital Stay (HOSPITAL_COMMUNITY): Payer: 59 | Attending: Hematology | Admitting: Hematology

## 2020-12-11 ENCOUNTER — Encounter (HOSPITAL_COMMUNITY): Payer: Self-pay | Admitting: Hematology

## 2020-12-11 VITALS — BP 135/76 | HR 87 | Temp 97.1°F | Resp 18 | Ht 65.0 in | Wt 246.8 lb

## 2020-12-11 DIAGNOSIS — D72829 Elevated white blood cell count, unspecified: Secondary | ICD-10-CM

## 2020-12-11 DIAGNOSIS — R718 Other abnormality of red blood cells: Secondary | ICD-10-CM | POA: Insufficient documentation

## 2020-12-11 DIAGNOSIS — R5383 Other fatigue: Secondary | ICD-10-CM | POA: Insufficient documentation

## 2020-12-11 DIAGNOSIS — M545 Low back pain, unspecified: Secondary | ICD-10-CM | POA: Insufficient documentation

## 2020-12-11 DIAGNOSIS — F419 Anxiety disorder, unspecified: Secondary | ICD-10-CM | POA: Diagnosis not present

## 2020-12-11 DIAGNOSIS — G4730 Sleep apnea, unspecified: Secondary | ICD-10-CM | POA: Insufficient documentation

## 2020-12-11 DIAGNOSIS — Z79899 Other long term (current) drug therapy: Secondary | ICD-10-CM | POA: Diagnosis not present

## 2020-12-11 DIAGNOSIS — D72828 Other elevated white blood cell count: Secondary | ICD-10-CM | POA: Diagnosis not present

## 2020-12-11 DIAGNOSIS — Z803 Family history of malignant neoplasm of breast: Secondary | ICD-10-CM | POA: Diagnosis not present

## 2020-12-11 DIAGNOSIS — G8929 Other chronic pain: Secondary | ICD-10-CM | POA: Insufficient documentation

## 2020-12-11 DIAGNOSIS — E079 Disorder of thyroid, unspecified: Secondary | ICD-10-CM | POA: Insufficient documentation

## 2020-12-11 DIAGNOSIS — Z7982 Long term (current) use of aspirin: Secondary | ICD-10-CM | POA: Insufficient documentation

## 2020-12-11 DIAGNOSIS — Z7985 Long-term (current) use of injectable non-insulin antidiabetic drugs: Secondary | ICD-10-CM | POA: Diagnosis not present

## 2020-12-11 DIAGNOSIS — Z7984 Long term (current) use of oral hypoglycemic drugs: Secondary | ICD-10-CM | POA: Diagnosis not present

## 2020-12-11 DIAGNOSIS — E119 Type 2 diabetes mellitus without complications: Secondary | ICD-10-CM | POA: Insufficient documentation

## 2020-12-11 DIAGNOSIS — K760 Fatty (change of) liver, not elsewhere classified: Secondary | ICD-10-CM | POA: Insufficient documentation

## 2020-12-11 DIAGNOSIS — D75839 Thrombocytosis, unspecified: Secondary | ICD-10-CM | POA: Insufficient documentation

## 2020-12-11 DIAGNOSIS — E039 Hypothyroidism, unspecified: Secondary | ICD-10-CM | POA: Diagnosis not present

## 2020-12-11 LAB — CBC WITH DIFFERENTIAL/PLATELET
Basophils Absolute: 0 10*3/uL (ref 0.0–0.1)
Basophils Relative: 0 %
Eosinophils Absolute: 0 10*3/uL (ref 0.0–0.5)
Eosinophils Relative: 0 %
HCT: 42.2 % (ref 36.0–46.0)
Hemoglobin: 13 g/dL (ref 12.0–15.0)
Lymphocytes Relative: 26 %
Lymphs Abs: 4.2 10*3/uL — ABNORMAL HIGH (ref 0.7–4.0)
MCH: 24.2 pg — ABNORMAL LOW (ref 26.0–34.0)
MCHC: 30.8 g/dL (ref 30.0–36.0)
MCV: 78.6 fL — ABNORMAL LOW (ref 80.0–100.0)
Monocytes Absolute: 0.7 10*3/uL (ref 0.1–1.0)
Monocytes Relative: 4 %
Neutro Abs: 11.4 10*3/uL — ABNORMAL HIGH (ref 1.7–7.7)
Neutrophils Relative %: 70 %
Platelets: 497 10*3/uL — ABNORMAL HIGH (ref 150–400)
RBC: 5.37 MIL/uL — ABNORMAL HIGH (ref 3.87–5.11)
RDW: 15.9 % — ABNORMAL HIGH (ref 11.5–15.5)
WBC: 16.3 10*3/uL — ABNORMAL HIGH (ref 4.0–10.5)
nRBC: 0 % (ref 0.0–0.2)

## 2020-12-11 LAB — IRON AND TIBC
Iron: 36 ug/dL (ref 28–170)
Saturation Ratios: 6 % — ABNORMAL LOW (ref 10.4–31.8)
TIBC: 608 ug/dL — ABNORMAL HIGH (ref 250–450)
UIBC: 572 ug/dL

## 2020-12-11 LAB — FOLATE: Folate: 13.2 ng/mL (ref >5.9)

## 2020-12-11 LAB — C-REACTIVE PROTEIN: CRP: 3.9 mg/dL — ABNORMAL HIGH (ref <1.0)

## 2020-12-11 LAB — FERRITIN: Ferritin: 17 ng/mL (ref 11–307)

## 2020-12-11 LAB — SEDIMENTATION RATE: Sed Rate: 25 mm/hr — ABNORMAL HIGH (ref 0–22)

## 2020-12-11 LAB — VITAMIN B12: Vitamin B-12: 508 pg/mL (ref 180–914)

## 2020-12-11 LAB — LACTATE DEHYDROGENASE: LDH: 115 U/L (ref 98–192)

## 2020-12-11 NOTE — Patient Instructions (Addendum)
[facility] at [facility] Discharge Instructions  You were seen and examined today by Dr. Ellin Saba. Dr. Ellin Saba is a hematologist, meaning that he specializes in blood disorders. Dr. Ellin Saba discussed your past medical history, family history of cancers/blood disorders, and the events that led to you being here today.  You were referred to Dr. Ellin Saba due to elevated white blood cell count. Dr. Ellin Saba has recommended additional lab work in an attempt to identify the cause of the elevated white blood cells. Dr. Ellin Saba is ruling out chronic leukemias and polycythemia.   It is also recommended that you start checking your sugars when you have night sweating episodes.  Follow-up as scheduled.   Thank you for choosing [facility] at [facility] to provide your oncology and hematology care.  To afford each patient quality time with our provider, please arrive at least 15 minutes before your scheduled appointment time.   If you have a lab appointment with the Cancer Center please come in thru the Main Entrance and check in at the main information desk.  You need to re-schedule your appointment should you arrive 10 or more minutes late.  We strive to give you quality time with our providers, and arriving late affects you and other patients whose appointments are after yours.  Also, if you no show three or more times for appointments you may be dismissed from the clinic at the providers discretion.     Again, thank you for choosing [facility].  Our hope is that these requests will decrease the amount of time that you wait before being seen by our physicians.       _____________________________________________________________  Should you have questions after your visit to [facility], please contact our office at [phone] and follow the prompts.  Our office hours are 8:00 a.m. and 4:30 p.m. Monday - Friday.   Please note that voicemails left after 4:00 p.m. may not be returned until the following business day.  We are closed weekends and major holidays.  You do have access to a nurse 24-7, just call the main number to the clinic [phone] and do not press any options, hold on the line and a nurse will answer the phone.    For prescription refill requests, have your pharmacy contact our office and allow 72 hours.    Due to Covid, you will need to wear a mask upon entering the hospital. If you do not have a mask, a mask will be given to you at the Main Entrance upon arrival. For doctor visits, patients may have 1 support person age 39 or older with them. For treatment visits, patients can not have anyone with them due to social distancing guidelines and our immunocompromised population.

## 2020-12-14 LAB — SURGICAL PATHOLOGY

## 2020-12-16 LAB — BCR-ABL1 FISH
Cells Analyzed: 200
Cells Counted: 200

## 2020-12-17 LAB — FLOW CYTOMETRY

## 2020-12-24 LAB — CALR + JAK2 E12-15 + MPL (REFLEXED)

## 2020-12-24 LAB — JAK2 V617F, W REFLEX TO CALR/E12/MPL

## 2020-12-29 DIAGNOSIS — F4323 Adjustment disorder with mixed anxiety and depressed mood: Secondary | ICD-10-CM | POA: Diagnosis not present

## 2020-12-29 NOTE — Progress Notes (Signed)
[address], [place] [address]   CLINIC:  Medical Oncology/Hematology  PCP:  Velna Hatchet, [place] [address] [phone]   REASON FOR VISIT:  Follow-up for leukocytosis  PRIOR THERAPY: None  CURRENT THERAPY: Under work-up  INTERVAL HISTORY:  Ms. Ortner 30 y.o. female returns for routine follow-up of leukocytosis.  She was seen by Dr. Delton Coombes for initial consultation 12/11/2020.  She returns today to discuss results of that work-up.  At today's visit, she reports feeling  fairly well.  No recent hospitalizations, surgeries, or changes in baseline health status.  She denies any unexplained fever or chills.  No unintentional weight loss, but she has been intentionally losing weight with diet, exercise, and medication.  She does continue to have drenching night sweats from the waist down once or twice per week.  She denies any recent steroid use or infections.  No new lumps or bumps.  She is a non-smoker.  No known history of autoimmune disease.  Regarding her iron deficiency, she reports mild fatigue with energy about 75%.  Energy has improved after she started B12 supplementation and use of CPAP for her sleep apnea.  She denies any hematemesis, epistaxis, hematochezia, or melena.  She does have moderately heavy periods that last 2 to 3 days, despite being on Lo-Loestrin, and is following with gynecology.  She donates blood 4-5 times per year.  She does not take any iron pill at home.  She has 75% energy and 100% appetite. She endorses that she is maintaining a stable weight.   REVIEW OF SYSTEMS:  Review of Systems  Constitutional:  Positive for fatigue. Negative for appetite change, chills, diaphoresis, fever and unexpected weight change.  HENT:   Negative for lump/mass and nosebleeds.   Eyes:  Negative for eye problems.  Respiratory:  Negative for cough, hemoptysis and shortness of breath.   Cardiovascular:   Negative for chest pain, leg swelling and palpitations.  Gastrointestinal:  Negative for abdominal pain, blood in stool, constipation, diarrhea, nausea and vomiting.  Genitourinary:  Negative for hematuria.   Skin: Negative.   Neurological:  Negative for dizziness, headaches and light-headedness.  Hematological:  Does not bruise/bleed easily.     PAST MEDICAL/SURGICAL HISTORY:  Past Medical History:  Diagnosis Date   Anemia    Anxiety 03/17/2015   BV (bacterial vaginosis) 04/10/2014   Chronic lower back pain    2 herniated disc sees Dr Carloyn Manner   Complication of anesthesia    O2 desaturation, Hypotension, Fluid filled lungs   Diabetes mellitus without complication ([place])    Encounter for menstrual regulation 03/13/2014   Family history of breast cancer    Family history of melanoma    Fatty liver 09/05/2018   Gallstones 09/05/2018   Refer to Dr Constance Haw   Hemorrhoids 01/12/2015   History of rectal bleeding 01/12/2015   Hypothyroidism    IC (interstitial cystitis) 01/22/2015   Pulmonary edema 08/21/2015   S/P back OR   Sleep apnea    Thyroid disease    Vaginal discharge 03/13/2014   Vaginal itching 01/12/2015   Vestibulitis, vulvar 04/21/2014   Yeast infection 03/13/2014   Past Surgical History:  Procedure Laterality Date   BACK SURGERY     CHOLECYSTECTOMY N/A 10/17/2018   Procedure: LAPAROSCOPIC CHOLECYSTECTOMY;  Surgeon: Virl Cagey, MD;  Location: [facility];  Service: General;  Laterality: N/A;   LAMINECTOMY AND MICRODISCECTOMY LUMBAR SPINE Right 08/21/2015   L4-5 and L5-S 1  SOCIAL HISTORY:  Social History   Socioeconomic History   Marital status: Single    Spouse name: Not on file   Number of children: Not on file   Years of education: Not on file   Highest education level: Not on file  Occupational History   Occupation: Surgical Instrument cleaner    Employer: [employer]  Tobacco Use   Smoking status: Never   Smokeless tobacco: Never  Vaping Use   Vaping  Use: Never used  Substance and Sexual Activity   Alcohol use: Yes    Comment: occasionally    Drug use: No   Sexual activity: Not Currently    Birth control/protection: Pill, Condom  Other Topics Concern   Not on file  Social History Narrative   Not on file   Social Determinants of Health   Financial Resource Strain: Low Risk    Difficulty of Paying Living Expenses: Not very hard  Food Insecurity: No Food Insecurity   Worried About Charity fundraiser in the Last Year: Never true   Ran Out of Food in the Last Year: Never true  Transportation Needs: No Transportation Needs   Lack of Transportation (Medical): No   Lack of Transportation (Non-Medical): No  Physical Activity: Insufficiently Active   Days of Exercise per Week: 1 day   Minutes of Exercise per Session: 60 min  Stress: Stress Concern Present   Feeling of Stress : To some extent  Social Connections: Moderately Integrated   Frequency of Communication with Friends and Family: More than three times a week   Frequency of Social Gatherings with Friends and Family: Three times a week   Attends Religious Services: More than 4 times per year   Active Member of Clubs or Organizations: Yes   Attends Music therapist: More than 4 times per year   Marital Status: Never married  Human resources officer Violence: Not At Risk   Fear of Current or Ex-Partner: No   Emotionally Abused: No   Physically Abused: No   Sexually Abused: No    FAMILY HISTORY:  Family History  Problem Relation Age of Onset   Diabetes Mother    Thyroid disease Mother    Breast cancer Mother 33       CHEK2 pos   Thyroid disease Brother    Diabetes Maternal Grandmother    Melanoma Maternal Grandmother        d. 54   Cancer Paternal Grandmother    Melanoma Maternal Grandfather        d. 62   Kidney cancer Other        MGF's sister   Breast cancer Cousin 58       mother's pat first cousin    CURRENT MEDICATIONS:  Outpatient Encounter  Medications as of 12/30/2020  Medication Sig Note   albuterol (VENTOLIN HFA) 108 (90 Base) MCG/ACT inhaler Inhale 2 puffs into the lungs every 8 (eight) hours as needed for wheezing or shortness of breath. 06/01/2020: Pt states she does not feel like this medication is working.   APPLE CIDER VINEGAR PO Take 2 tablets by mouth daily.    aspirin EC 81 MG tablet Take 81 mg by mouth daily.    BLISOVI 24 FE 1-20 MG-MCG(24) tablet Take 1 tablet by mouth daily. Needs office visit    Blood Glucose Monitoring Suppl (FREESTYLE LITE) w/Device KIT Use as directed to test 4 times daily    cetirizine (ZYRTEC ALLERGY) 10 MG tablet Take 1  tablet (10 mg total) by mouth daily.    Cyanocobalamin (VITAMIN B-12) 1000 MCG SUBL Place 1 tablet under the tongue.    Dulaglutide (TRULICITY) 1.5 RU/0.4VW SOPN Inject 1.5 mg into the skin once a week.    Empagliflozin-metFORMIN HCl ER (SYNJARDY XR) 25-1000 MG TB24 Take 1 tablet by mouth daily with breakfast.    Empagliflozin-metFORMIN HCl ER (SYNJARDY XR) 25-1000 MG TB24 Take 1 tablet by mouth daily with breakfast.    escitalopram (LEXAPRO) 20 MG tablet TAKE 1 TABLET BY MOUTH ONCE DAILY    escitalopram (LEXAPRO) 20 MG tablet Take 1 tablet (20 mg total) by mouth daily.    escitalopram (LEXAPRO) 20 MG tablet Take 1 tablet (20 mg total) by mouth daily.    Esketamine HCl, 56 MG Dose, (SPRAVATO, 56 MG DOSE,) 28 MG/DEVICE SOPK mornings    Ferrous Sulfate (IRON PO) Take 1 tablet by mouth daily.    fluconazole (DIFLUCAN) 150 MG tablet Take 1 tablet (150 mg total) by mouth once as needed for up to one dose    fluticasone (FLONASE) 50 MCG/ACT nasal spray Place 2 sprays into both nostrils daily.    fluticasone (FLONASE) 50 MCG/ACT nasal spray Place into the nose.    FREESTYLE LITE test strip 1 each 2 (two) times daily.    gabapentin (NEURONTIN) 300 MG capsule Take 300 mg by mouth daily as needed (pain).    glucose blood (FREESTYLE LITE) test strip Use to test blood sugar 4 times daily     glucose blood (FREESTYLE LITE) test strip Use to test blood sugars 4 times daily    glucose blood test strip Use to test 2 times daily.    guaiFENesin-codeine (ROBITUSSIN AC) 100-10 MG/5ML syrup Take 5 mLs by mouth 3 (three) times daily as needed for cough.    HYDROcodone-acetaminophen (NORCO/VICODIN) 5-325 MG tablet Take 1 tablet by mouth every 6 (six) hours as needed for severe pain.    HYDROcodone-acetaminophen (NORCO/VICODIN) 5-325 MG tablet Take 1 tablet by mouth 3 (three) times daily for 5 days as needed for severe pain.    Ibuprofen 200 MG CAPS Take 800 mg by mouth every 8 (eight) hours as needed for moderate pain.     Lancets (FREESTYLE) lancets Use to test blood sugar 4 times daily.    Lancets (FREESTYLE) lancets Use to test blood sugar 4 times daily    levothyroxine (SYNTHROID) 100 MCG tablet Take 1 tablet (100 mcg total) by mouth in the morning on an empty stomach    levothyroxine (SYNTHROID) 75 MCG tablet TAKE 1 TABLET BY MOUTH DAILY EVERY MORNING    levothyroxine (SYNTHROID) 75 MCG tablet TAKE 1 TABLET BY MOUTH IN THE MORNING    levothyroxine (SYNTHROID) 75 MCG tablet Take 1 tablet by mouth every morning.    methocarbamol (ROBAXIN) 500 MG tablet Take 1 tablet (500 mg total) by mouth 2 (two) times daily.    methocarbamol (ROBAXIN) 500 MG tablet Take 1 tablet (500 mg total) by mouth every 8 (eight) hours.    Multiple Vitamin (MULTIVITAMIN WITH MINERALS) TABS tablet Take 1 tablet by mouth daily. OTC    Multiple Vitamins-Minerals (ONE-DAILY MULTI-VIT/MINERAL) PACK Take by mouth.    naproxen (NAPROSYN) 500 MG tablet Take 1 tablet (500 mg total) by mouth 2 (two) times daily with a meal.    OneTouch Delica Lancets 09W MISC Use to self monitor blood glucose twice daily (DX: ICD10: R73.01)    sertraline (ZOLOFT) 25 MG tablet TAKE 1 TABLET BY MOUTH ONCE DAILY.  Zinc Sulfate (ZINC 15 PO) Take by mouth. OTC    No facility-administered encounter medications on file as of 12/30/2020.     ALLERGIES:  Allergies  Allergen Reactions   Metformin Hcl Er Other (See Comments)     PHYSICAL EXAM:  ECOG PERFORMANCE STATUS: 1 - Symptomatic but completely ambulatory  There were no vitals filed for this visit. There were no vitals filed for this visit. Physical Exam Constitutional:      Appearance: Normal appearance. She is morbidly obese.  HENT:     Head: Normocephalic and atraumatic.     Mouth/Throat:     Mouth: Mucous membranes are moist.  Eyes:     Extraocular Movements: Extraocular movements intact.     Pupils: Pupils are equal, round, and reactive to light.  Cardiovascular:     Rate and Rhythm: Normal rate and regular rhythm.     Pulses: Normal pulses.     Heart sounds: Normal heart sounds.  Pulmonary:     Effort: Pulmonary effort is normal.     Breath sounds: Normal breath sounds.  Abdominal:     General: Bowel sounds are normal.     Palpations: Abdomen is soft.     Tenderness: There is no abdominal tenderness.  Musculoskeletal:        General: No swelling.     Right lower leg: No edema.     Left lower leg: No edema.  Lymphadenopathy:     Cervical: No cervical adenopathy.  Skin:    General: Skin is warm and dry.  Neurological:     General: No focal deficit present.     Mental Status: She is alert and oriented to person, place, and time.  Psychiatric:        Mood and Affect: Mood normal.        Behavior: Behavior normal.     LABORATORY DATA:  I have reviewed the labs as listed.  CBC    Component Value Date/Time   WBC 16.3 (H) 12/11/2020 0904   RBC 5.37 (H) 12/11/2020 0904   HGB 13.0 12/11/2020 0904   HCT 42.2 12/11/2020 0904   PLT 497 (H) 12/11/2020 0904   MCV 78.6 (L) 12/11/2020 0904   MCH 24.2 (L) 12/11/2020 0904   MCHC 30.8 12/11/2020 0904   RDW 15.9 (H) 12/11/2020 0904   LYMPHSABS 4.2 (H) 12/11/2020 0904   MONOABS 0.7 12/11/2020 0904   EOSABS 0.0 12/11/2020 0904   BASOSABS 0.0 12/11/2020 0904   CMP Latest Ref Rng & Units  06/01/2020 10/16/2018 11/06/2015  Glucose 70 - 99 mg/dL 157(H) 230(H) 165(H)  BUN 6 - 20 mg/dL _0 Creatinine 0.44 - 1.00 mg/dL 0.46 0.45 0.61  Sodium 135 - 145 mmol/L 133(L) 136 138  Potassium 3.5 - 5.1 mmol/L 4.7 4.5 3.4(L)  Chloride 98 - 111 mmol/L 101 103 105  CO2 22 - 32 mmol/L 20(L) 23 23  Calcium 8.9 - 10.3 mg/dL 9.4 9.1 9.4  Total Protein 6.5 - 8.1 g/dL 7.6 7.1 -  Total Bilirubin 0.3 - 1.2 mg/dL 0.5 0.7 -  Alkaline Phos 38 - 126 U/L 68 49 -  AST 15 - 41 U/L 41 20 -  ALT 0 - 44 U/L 41 24 -    DIAGNOSTIC IMAGING:  I have independently reviewed the relevant imaging and discussed with the patient.  ASSESSMENT & PLAN: 1.  Leukocytosis and thrombocytosis: - She is evaluated for nonspecific leukocytosis with intermittently elevated lymphocytes and neutrophils at least since July 2017.  It was predominantly neutrophilic leukocytosis with occasional high absolute lymphocyte count. - Denies any fevers.  She has occasional sweats, averaging once per week, waist down, for the past 2 months, during daytime when she sleeps.  She usually works at night.  She lost about 15 pounds since April and was trying to lose weight. - She is not on any steroids.  No history of splenectomy. - She denies any recurrent infections. - CBC from 12/11/2020 showed WBC 16.3 with ANC 11.4 and lymphocytes 4.2.  Mild thrombocytosis with platelets 497. - MPN work-up was negative.  (Negative BCR/ABL FISH.  Negative JAK2/CALR/MPL.  Flow cytometry negative for monoclonal B-cell population or abnormal T-cell phenotype.)  LDH normal. - Positive inflammatory markers with ESR 25 and CRP 3.9 - PLAN: No concern for malignant leukocytosis at this time. - Suspect reactive leukocytosis in the setting of morbid obesity and chronic inflammation. - Suspect reactive thrombocytosis from iron deficiency as well as morbid obesity and chronic inflammation. - Repeat CBC, ESR, and CRP as well as ANA and RF see in 3 months.  2.  Iron  deficiency with microcytosis, without anemia: - CBC (12/11/2020) shows normal Hgb 13.0, but microcytosis with MCV 78.6, and elevated RBC 5.37. - Iron panel (12/11/2020) shows iron deficiency with ferritin 17, iron saturation 6%, and TIBC 608.  Folate and B12 were normal. - She has moderately heavy periods that last 2 to 3 days.  She also gets blood 4-5 times per year. - PLAN: Recommend starting oral ferrous sulfate 325 mg daily. - Recommend Colace if any iron associated constipation. Take with orange juice for better absorption.  Can try ferrous bisglycinate if intolerant to ferrous sulfate. - Repeat CBC and iron panel in 3 months. - Patient instructed to stop donating blood until her iron deficiency has resolved.  3.  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  She is a non-smoker. - Mother had breast cancer.  Maternal great grandmother had cancer.   PLAN SUMMARY & DISPOSITION: Labs in 3 months RTC after labs  All questions were answered. The patient knows to call the clinic with any problems, questions or concerns.  Medical decision making: Moderate  Time spent on visit: I spent 20 minutes counseling the patient face to face. The total time spent in the appointment was 30 minutes and more than 50% was on counseling.   Harriett Rush, PA-C  12/30/2020 4:50 PM

## 2020-12-30 ENCOUNTER — Other Ambulatory Visit: Payer: Self-pay

## 2020-12-30 ENCOUNTER — Inpatient Hospital Stay (HOSPITAL_BASED_OUTPATIENT_CLINIC_OR_DEPARTMENT_OTHER): Payer: 59 | Admitting: Physician Assistant

## 2020-12-30 VITALS — BP 108/55 | HR 89 | Temp 98.1°F | Resp 20 | Wt 247.1 lb

## 2020-12-30 DIAGNOSIS — E611 Iron deficiency: Secondary | ICD-10-CM | POA: Diagnosis not present

## 2020-12-30 DIAGNOSIS — D72829 Elevated white blood cell count, unspecified: Secondary | ICD-10-CM

## 2020-12-30 DIAGNOSIS — D72828 Other elevated white blood cell count: Secondary | ICD-10-CM | POA: Diagnosis not present

## 2020-12-30 DIAGNOSIS — D75839 Thrombocytosis, unspecified: Secondary | ICD-10-CM | POA: Diagnosis not present

## 2020-12-30 DIAGNOSIS — R5383 Other fatigue: Secondary | ICD-10-CM | POA: Diagnosis not present

## 2020-12-30 DIAGNOSIS — R718 Other abnormality of red blood cells: Secondary | ICD-10-CM | POA: Diagnosis not present

## 2020-12-30 DIAGNOSIS — G8929 Other chronic pain: Secondary | ICD-10-CM | POA: Diagnosis not present

## 2020-12-30 DIAGNOSIS — M545 Low back pain, unspecified: Secondary | ICD-10-CM | POA: Diagnosis not present

## 2020-12-30 DIAGNOSIS — Z803 Family history of malignant neoplasm of breast: Secondary | ICD-10-CM | POA: Diagnosis not present

## 2020-12-30 DIAGNOSIS — E119 Type 2 diabetes mellitus without complications: Secondary | ICD-10-CM | POA: Diagnosis not present

## 2020-12-30 DIAGNOSIS — F419 Anxiety disorder, unspecified: Secondary | ICD-10-CM | POA: Diagnosis not present

## 2020-12-30 NOTE — Patient Instructions (Signed)
[facility] at [facility] Discharge Instructions  You were seen today by Rojelio Brenner PA-C for your blood abnormalities, as discussed below.  ELEVATED WHITE BLOOD CELLS & PLATELETS: We checked several tests for your elevated white blood cells and platelets, which showed that you do not have any type of blood cancer.  Your inflammatory markers were high, which makes [place] suspect that your white blood cells and platelets are elevated due to underlying inflammation in your body.  This may be due to obesity, or other inflammatory causes.  IRON DEFICIENCY: Your iron levels are low, which is most likely related to regular blood loss from your menstrual periods.  We recommend that you start taking over-the-counter iron supplement (ferrous sulfate 325 mg) once daily.  This may cause some constipation, and you can use an over-the-counter stool softener such as Colace for treatment of constipation.  Take your iron pill with orange juice in the morning, to help improve absorption.  If you experience any stomach upset from your iron pill, take it along with food.  LABS: Return in 3 months for repeat labs  OTHER TESTS: None at this time  MEDICATIONS: Iron supplementation as above  FOLLOW-UP APPOINTMENT: Office visit in 3 months, after labs   Thank you for choosing [facility] at [facility] to provide your oncology and hematology care.  To afford each patient quality time with our provider, please arrive at least 15 minutes before your scheduled appointment time.   If you have a lab appointment with the Cancer Center please come in thru the Main Entrance and check in at the main information desk.  You need to re-schedule your appointment should you arrive 10 or more minutes late.  We strive to give you quality time with our providers, and arriving late affects you and other patients whose appointments are after yours.  Also, if you no show three or more times  for appointments you may be dismissed from the clinic at the providers discretion.     Again, thank you for choosing [facility].  Our hope is that these requests will decrease the amount of time that you wait before being seen by our physicians.       _____________________________________________________________  Should you have questions after your visit to [facility], please contact our office at [phone] and follow the prompts.  Our office hours are 8:00 a.m. and 4:30 p.m. Monday - Friday.  Please note that voicemails left after 4:00 p.m. may not be returned until the following business day.  We are closed weekends and major holidays.  You do have access to a nurse 24-7, just call the main number to the clinic [phone] and do not press any options, hold on the line and a nurse will answer the phone.    For prescription refill requests, have your pharmacy contact our office and allow 72 hours.    Due to Covid, you will need to wear a mask upon entering the hospital. If you do not have a mask, a mask will be given to you at the Main Entrance upon arrival. For doctor visits, patients may have 1 support person age 56 or older with them. For treatment visits, patients can not have anyone with them due to social distancing guidelines and our immunocompromised population.

## 2021-01-05 DIAGNOSIS — F4323 Adjustment disorder with mixed anxiety and depressed mood: Secondary | ICD-10-CM | POA: Diagnosis not present

## 2021-01-11 DIAGNOSIS — F4323 Adjustment disorder with mixed anxiety and depressed mood: Secondary | ICD-10-CM | POA: Diagnosis not present

## 2021-01-22 DIAGNOSIS — F4323 Adjustment disorder with mixed anxiety and depressed mood: Secondary | ICD-10-CM | POA: Diagnosis not present

## 2021-01-26 ENCOUNTER — Other Ambulatory Visit (HOSPITAL_COMMUNITY): Payer: Self-pay

## 2021-01-28 ENCOUNTER — Other Ambulatory Visit (HOSPITAL_COMMUNITY): Payer: Self-pay

## 2021-01-28 DIAGNOSIS — R5383 Other fatigue: Secondary | ICD-10-CM | POA: Diagnosis not present

## 2021-01-28 DIAGNOSIS — Z1152 Encounter for screening for COVID-19: Secondary | ICD-10-CM | POA: Diagnosis not present

## 2021-01-28 DIAGNOSIS — G4733 Obstructive sleep apnea (adult) (pediatric): Secondary | ICD-10-CM | POA: Diagnosis not present

## 2021-01-28 DIAGNOSIS — J018 Other acute sinusitis: Secondary | ICD-10-CM | POA: Diagnosis not present

## 2021-01-28 DIAGNOSIS — R059 Cough, unspecified: Secondary | ICD-10-CM | POA: Diagnosis not present

## 2021-01-28 DIAGNOSIS — J029 Acute pharyngitis, unspecified: Secondary | ICD-10-CM | POA: Diagnosis not present

## 2021-01-28 DIAGNOSIS — E1169 Type 2 diabetes mellitus with other specified complication: Secondary | ICD-10-CM | POA: Diagnosis not present

## 2021-01-28 MED ORDER — AZITHROMYCIN 250 MG PO TABS
ORAL_TABLET | ORAL | 0 refills | Status: DC
  Filled 2021-01-28: qty 6, 5d supply, fill #0

## 2021-02-09 ENCOUNTER — Other Ambulatory Visit (HOSPITAL_COMMUNITY): Payer: Self-pay

## 2021-02-12 ENCOUNTER — Other Ambulatory Visit (HOSPITAL_COMMUNITY): Payer: Self-pay

## 2021-02-12 MED ORDER — ERYTHROMYCIN 5 MG/GM OP OINT
TOPICAL_OINTMENT | OPHTHALMIC | 0 refills | Status: DC
  Filled 2021-02-12: qty 3.5, 7d supply, fill #0

## 2021-02-12 MED ORDER — AMOXICILLIN-POT CLAVULANATE 875-125 MG PO TABS
1.0000 | ORAL_TABLET | Freq: Two times a day (BID) | ORAL | 0 refills | Status: DC
  Filled 2021-02-12: qty 14, 7d supply, fill #0

## 2021-02-15 ENCOUNTER — Other Ambulatory Visit (HOSPITAL_COMMUNITY): Payer: Self-pay

## 2021-02-22 ENCOUNTER — Other Ambulatory Visit (HOSPITAL_COMMUNITY): Payer: Self-pay

## 2021-02-22 DIAGNOSIS — R0981 Nasal congestion: Secondary | ICD-10-CM | POA: Diagnosis not present

## 2021-02-22 DIAGNOSIS — G4733 Obstructive sleep apnea (adult) (pediatric): Secondary | ICD-10-CM | POA: Diagnosis not present

## 2021-02-22 DIAGNOSIS — Z1152 Encounter for screening for COVID-19: Secondary | ICD-10-CM | POA: Diagnosis not present

## 2021-02-22 DIAGNOSIS — J351 Hypertrophy of tonsils: Secondary | ICD-10-CM | POA: Diagnosis not present

## 2021-02-22 DIAGNOSIS — R519 Headache, unspecified: Secondary | ICD-10-CM | POA: Diagnosis not present

## 2021-02-22 DIAGNOSIS — E1169 Type 2 diabetes mellitus with other specified complication: Secondary | ICD-10-CM | POA: Diagnosis not present

## 2021-02-22 DIAGNOSIS — J029 Acute pharyngitis, unspecified: Secondary | ICD-10-CM | POA: Diagnosis not present

## 2021-02-22 MED ORDER — LIDOCAINE VISCOUS HCL 2 % MT SOLN
5.0000 mL | Freq: Four times a day (QID) | OROMUCOSAL | 0 refills | Status: DC
  Filled 2021-02-22: qty 480, 24d supply, fill #0

## 2021-02-22 MED ORDER — CEFDINIR 300 MG PO CAPS
300.0000 mg | ORAL_CAPSULE | Freq: Two times a day (BID) | ORAL | 0 refills | Status: DC
  Filled 2021-02-22: qty 14, 7d supply, fill #0

## 2021-02-22 MED ORDER — STERILE WATER FOR INJECTION IJ SOLN
5.0000 mL | Freq: Four times a day (QID) | OROMUCOSAL | 0 refills | Status: DC
  Filled 2021-02-22: qty 480, 24d supply, fill #0

## 2021-02-23 DIAGNOSIS — G4733 Obstructive sleep apnea (adult) (pediatric): Secondary | ICD-10-CM | POA: Diagnosis not present

## 2021-02-25 DIAGNOSIS — F4323 Adjustment disorder with mixed anxiety and depressed mood: Secondary | ICD-10-CM | POA: Diagnosis not present

## 2021-03-03 DIAGNOSIS — Z8709 Personal history of other diseases of the respiratory system: Secondary | ICD-10-CM | POA: Diagnosis not present

## 2021-03-04 ENCOUNTER — Other Ambulatory Visit (HOSPITAL_COMMUNITY): Payer: Self-pay

## 2021-03-04 MED ORDER — TRULICITY 1.5 MG/0.5ML ~~LOC~~ SOAJ
1.5000 mg | SUBCUTANEOUS | 5 refills | Status: DC
  Filled 2021-03-04: qty 6, 84d supply, fill #0
  Filled 2021-05-11: qty 6, 84d supply, fill #1

## 2021-03-08 DIAGNOSIS — F4323 Adjustment disorder with mixed anxiety and depressed mood: Secondary | ICD-10-CM | POA: Diagnosis not present

## 2021-03-10 ENCOUNTER — Encounter: Payer: Self-pay | Admitting: Adult Health

## 2021-03-10 ENCOUNTER — Ambulatory Visit: Payer: 59 | Admitting: Adult Health

## 2021-03-10 VITALS — BP 120/82 | HR 87 | Ht 65.0 in | Wt 251.6 lb

## 2021-03-10 DIAGNOSIS — Z9989 Dependence on other enabling machines and devices: Secondary | ICD-10-CM

## 2021-03-10 DIAGNOSIS — G4733 Obstructive sleep apnea (adult) (pediatric): Secondary | ICD-10-CM | POA: Diagnosis not present

## 2021-03-10 NOTE — Patient Instructions (Signed)
Continue using CPAP nightly and greater than 4 hours each night °If your symptoms worsen or you develop new symptoms please let us know.  ° °

## 2021-03-10 NOTE — Progress Notes (Signed)
PATIENT: Karen Hoover DOB: 05-Nov-1990  REASON FOR VISIT: follow up HISTORY FROM: patient   HISTORY OF PRESENT ILLNESS: Today 03/10/21:  Karen Hoover is a 31 year old female with a history of obstructive sleep apnea on CPAP.  She returns today for follow-up.  Her download indicates that she used the machine 20 out of the last 30 days for compliance of 66%.  On average she uses the machine about 2 hours and 31 minutes.  Her residual AHI is 1.4 on 9 cm of water.  She states that she has been having hard time using the CPAP due to a sore throat and what she feels like it swollen tonsils.  She has been evaluated by her PCP.  Strep, COVID and influenza has been negative.  She did see ENT but during that visit was not having an exacerbation of pain.  The patient reports that she often has to take Tylenol to reduce her pain.  For this reason she has not been able to use her CPAP as often.    REVIEW OF SYSTEMS: Out of a complete 14 system review of symptoms, the patient complains only of the following symptoms, and all other reviewed systems are negative.  ESS 8  ALLERGIES: Allergies  Allergen Reactions   Metformin Hcl Er Other (See Comments)    HOME MEDICATIONS: Outpatient Medications Prior to Visit  Medication Sig Dispense Refill   albuterol (VENTOLIN HFA) 108 (90 Base) MCG/ACT inhaler Inhale 2 puffs into the lungs every 8 (eight) hours as needed for wheezing or shortness of breath.     amoxicillin-clavulanate (AUGMENTIN) 875-125 MG tablet Take 1 tablet by mouth 2 (two) times daily. 14 tablet 0   APPLE CIDER VINEGAR PO Take 2 tablets by mouth daily.     aspirin EC 81 MG tablet Take 81 mg by mouth daily.     azithromycin (ZITHROMAX) 250 MG tablet Take 2 tablets by mouth today then 1 tab daily for 4 more days 6 each 0   BLISOVI 24 FE 1-20 MG-MCG(24) tablet Take 1 tablet by mouth daily. Needs office visit 84 tablet 4   Blood Glucose Monitoring Suppl (FREESTYLE LITE) w/Device KIT Use as  directed to test 4 times daily 1 kit 0   cefdinir (OMNICEF) 300 MG capsule Take 1 capsule (300 mg total) by mouth 2 (two) times daily at breakfast and supper for 7 days 14 capsule 0   cetirizine (ZYRTEC ALLERGY) 10 MG tablet Take 1 tablet (10 mg total) by mouth daily. 30 tablet 0   Cyanocobalamin (VITAMIN B-12) 1000 MCG SUBL Place 1 tablet under the tongue.     Dulaglutide (TRULICITY) 1.5 GM/0.1UU SOPN Inject 1.5 mg into the skin once a week. 2 mL 5   Empagliflozin-metFORMIN HCl ER (SYNJARDY XR) 25-1000 MG TB24 Take 1 tablet by mouth daily with breakfast. 30 tablet 6   Empagliflozin-metFORMIN HCl ER (SYNJARDY XR) 25-1000 MG TB24 Take 1 tablet by mouth daily with breakfast. 90 tablet 3   erythromycin ophthalmic ointment Apply 1 application to affected eye 4 times a day 3.5 g 0   escitalopram (LEXAPRO) 20 MG tablet TAKE 1 TABLET BY MOUTH ONCE DAILY 30 tablet 5   escitalopram (LEXAPRO) 20 MG tablet Take 1 tablet (20 mg total) by mouth daily. 90 tablet 1   escitalopram (LEXAPRO) 20 MG tablet Take 1 tablet (20 mg total) by mouth daily. 90 tablet 3   Esketamine HCl, 56 MG Dose, (SPRAVATO, 56 MG DOSE,) 28 MG/DEVICE SOPK mornings  Ferrous Sulfate (IRON PO) Take 1 tablet by mouth daily.     fluconazole (DIFLUCAN) 150 MG tablet Take 1 tablet (150 mg total) by mouth once as needed for up to one dose 6 tablet 2   fluticasone (FLONASE) 50 MCG/ACT nasal spray Place 2 sprays into both nostrils daily. 16 g 2   fluticasone (FLONASE) 50 MCG/ACT nasal spray Place into the nose.     FREESTYLE LITE test strip 1 each 2 (two) times daily.     gabapentin (NEURONTIN) 300 MG capsule Take 300 mg by mouth daily as needed (pain).     glucose blood (FREESTYLE LITE) test strip Use to test blood sugar 4 times daily 100 strip 5   glucose blood (FREESTYLE LITE) test strip Use to test blood sugars 4 times daily 400 strip 5   glucose blood test strip Use to test 2 times daily. 100 each 3   guaiFENesin-codeine (ROBITUSSIN AC)  100-10 MG/5ML syrup Take 5 mLs by mouth 3 (three) times daily as needed for cough. 120 mL 0   HYDROcodone-acetaminophen (NORCO/VICODIN) 5-325 MG tablet Take 1 tablet by mouth every 6 (six) hours as needed for severe pain. 8 tablet 0   HYDROcodone-acetaminophen (NORCO/VICODIN) 5-325 MG tablet Take 1 tablet by mouth 3 (three) times daily for 5 days as needed for severe pain. 15 tablet 0   Ibuprofen 200 MG CAPS Take 800 mg by mouth every 8 (eight) hours as needed for moderate pain.      Lancets (FREESTYLE) lancets Use to test blood sugar 4 times daily. 100 each 6   Lancets (FREESTYLE) lancets Use to test blood sugar 4 times daily 400 each 5   levothyroxine (SYNTHROID) 100 MCG tablet Take 1 tablet (100 mcg total) by mouth in the morning on an empty stomach 90 tablet 3   levothyroxine (SYNTHROID) 75 MCG tablet Take 1 tablet by mouth every morning.     magic mouthwash (multi-ingredient) oral suspension Swish and swallow 5 mLs four times a day 480 mL 0   methocarbamol (ROBAXIN) 500 MG tablet Take 1 tablet (500 mg total) by mouth 2 (two) times daily. 10 tablet 0   methocarbamol (ROBAXIN) 500 MG tablet Take 1 tablet (500 mg total) by mouth every 8 (eight) hours. 90 tablet 0   Multiple Vitamin (MULTIVITAMIN WITH MINERALS) TABS tablet Take 1 tablet by mouth daily. OTC     Multiple Vitamins-Minerals (ONE-DAILY MULTI-VIT/MINERAL) PACK Take by mouth.     naproxen (NAPROSYN) 500 MG tablet Take 1 tablet (500 mg total) by mouth 2 (two) times daily with a meal. 30 tablet 0   OneTouch Delica Lancets 83T MISC Use to self monitor blood glucose twice daily (DX: ICD10: R73.01)     Zinc Sulfate (ZINC 15 PO) Take by mouth. OTC     levothyroxine (SYNTHROID) 75 MCG tablet TAKE 1 TABLET BY MOUTH DAILY EVERY MORNING 90 tablet 3   levothyroxine (SYNTHROID) 75 MCG tablet TAKE 1 TABLET BY MOUTH IN THE MORNING 90 tablet 2   sertraline (ZOLOFT) 25 MG tablet TAKE 1 TABLET BY MOUTH ONCE DAILY. 90 tablet 1   No facility-administered  medications prior to visit.    PAST MEDICAL HISTORY: Past Medical History:  Diagnosis Date   Anemia    Anxiety 03/17/2015   BV (bacterial vaginosis) 04/10/2014   Chronic lower back pain    2 herniated disc sees Dr Carloyn Manner   Complication of anesthesia    O2 desaturation, Hypotension, Fluid filled lungs   Diabetes mellitus without  complication ([place])    Encounter for menstrual regulation 03/13/2014   Family history of breast cancer    Family history of melanoma    Fatty liver 09/05/2018   Gallstones 09/05/2018   Refer to Dr Constance Haw   Hemorrhoids 01/12/2015   History of rectal bleeding 01/12/2015   Hypothyroidism    IC (interstitial cystitis) 01/22/2015   Pulmonary edema 08/21/2015   S/P back OR   Sleep apnea    Thyroid disease    Vaginal discharge 03/13/2014   Vaginal itching 01/12/2015   Vestibulitis, vulvar 04/21/2014   Yeast infection 03/13/2014    PAST SURGICAL HISTORY: Past Surgical History:  Procedure Laterality Date   BACK SURGERY     CHOLECYSTECTOMY N/A 10/17/2018   Procedure: LAPAROSCOPIC CHOLECYSTECTOMY;  Surgeon: Virl Cagey, MD;  Location: [facility];  Service: General;  Laterality: N/A;   LAMINECTOMY AND MICRODISCECTOMY LUMBAR SPINE Right 08/21/2015   L4-5 and L5-S 1    FAMILY HISTORY: Family History  Problem Relation Age of Onset   Diabetes Mother    Thyroid disease Mother    Breast cancer Mother 78       CHEK2 pos   Thyroid disease Brother    Diabetes Maternal Grandmother    Melanoma Maternal Grandmother        d. 42   Melanoma Maternal Grandfather        d. 28   Cancer Paternal Grandmother    Breast cancer Cousin 33       mother's pat first cousin   Kidney cancer Other        MGF's sister   Sleep apnea Neg Hx     SOCIAL HISTORY: Social History   Socioeconomic History   Marital status: Single    Spouse name: Not on file   Number of children: Not on file   Years of education: Not on file   Highest education level: Not on file  Occupational History    Occupation: Surgical Instrument Animal nutritionist: [employer]  Tobacco Use   Smoking status: Never   Smokeless tobacco: Never  Vaping Use   Vaping Use: Never used  Substance and Sexual Activity   Alcohol use: Yes    Comment: occasionally    Drug use: No   Sexual activity: Not Currently    Birth control/protection: Pill, Condom  Other Topics Concern   Not on file  Social History Narrative   Not on file   Social Determinants of Health   Financial Resource Strain: Low Risk    Difficulty of Paying Living Expenses: Not very hard  Food Insecurity: No Food Insecurity   Worried About Charity fundraiser in the Last Year: Never true   [place] in the Last Year: Never true  Transportation Needs: No Transportation Needs   Lack of Transportation (Medical): No   Lack of Transportation (Non-Medical): No  Physical Activity: Insufficiently Active   Days of Exercise per Week: 1 day   Minutes of Exercise per Session: 60 min  Stress: Stress Concern Present   Feeling of Stress : To some extent  Social Connections: Moderately Integrated   Frequency of Communication with Friends and Family: More than three times a week   Frequency of Social Gatherings with Friends and Family: Three times a week   Attends Religious Services: More than 4 times per year   Active Member of Clubs or Organizations: Yes   Attends Archivist Meetings: More than 4 times  per year   Marital Status: Never married  Intimate Partner Violence: Not At Risk   Fear of Current or Ex-Partner: No   Emotionally Abused: No   Physically Abused: No   Sexually Abused: No      PHYSICAL EXAM  Vitals:   03/10/21 1537  BP: 120/82  Pulse: 87  Weight: 251 lb 9.6 oz (114.1 kg)  Height: 5' 5"  (1.651 m)   Body mass index is 41.87 kg/m.  Generalized: Well developed, in no acute distress  Chest: Lungs clear to auscultation bilaterally  Neurological examination  Mentation: Alert oriented to time,  place, history taking. Follows all commands speech and language fluent Cranial nerve II-XII: Extraocular movements were full, visual field were full on confrontational test Head turning and shoulder shrug  were normal and symmetric. Motor: The motor testing reveals 5 over 5 strength of all 4 extremities. Good symmetric motor tone is noted throughout.  Sensory: Sensory testing is intact to soft touch on all 4 extremities. No evidence of extinction is noted.  Gait and station: Gait is normal.    DIAGNOSTIC DATA (LABS, IMAGING, TESTING) - I reviewed patient records, labs, notes, testing and imaging myself where available.  Lab Results  Component Value Date   WBC 16.3 (H) 12/11/2020   HGB 13.0 12/11/2020   HCT 42.2 12/11/2020   MCV 78.6 (L) 12/11/2020   PLT 497 (H) 12/11/2020      Component Value Date/Time   NA 133 (L) 06/01/2020 1837   NA 139 04/10/2014 1021   K 4.7 06/01/2020 1837   CL 101 06/01/2020 1837   CO2 20 (L) 06/01/2020 1837   GLUCOSE 157 (H) 06/01/2020 1837   BUN 15 06/01/2020 1837   BUN 9 04/10/2014 1021   CREATININE 0.46 06/01/2020 1837   CALCIUM 9.4 06/01/2020 1837   PROT 7.6 06/01/2020 1837   PROT 6.5 04/10/2014 1021   ALBUMIN 3.7 06/01/2020 1837   ALBUMIN 4.1 04/10/2014 1021   AST 41 06/01/2020 1837   ALT 41 06/01/2020 1837   ALKPHOS 68 06/01/2020 1837   BILITOT 0.5 06/01/2020 1837   BILITOT 0.5 04/10/2014 1021   GFRNONAA >60 06/01/2020 1837   GFRAA >60 10/16/2018 1024   No results found for: CHOL, HDL, LDLCALC, LDLDIRECT, TRIG, CHOLHDL Lab Results  Component Value Date   HGBA1C 6.8 (H) 10/16/2018   Lab Results  Component Value Date   VITAMINB12 508 12/11/2020   No results found for: TSH    ASSESSMENT AND PLAN 31 y.o. year old female  has a past medical history of Anemia, Anxiety (03/17/2015), BV (bacterial vaginosis) (04/10/2014), Chronic lower back pain, Complication of anesthesia, Diabetes mellitus without complication ([place]), Encounter for menstrual  regulation (03/13/2014), Family history of breast cancer, Family history of melanoma, Fatty liver (09/05/2018), Gallstones (09/05/2018), Hemorrhoids (01/12/2015), History of rectal bleeding (01/12/2015), Hypothyroidism, IC (interstitial cystitis) (01/22/2015), Pulmonary edema (08/21/2015), Sleep apnea, Thyroid disease, Vaginal discharge (03/13/2014), Vaginal itching (01/12/2015), Vestibulitis, vulvar (04/21/2014), and Yeast infection (03/13/2014). here with:  OSA on CPAP  - CPAP compliance suboptimal - Good treatment of AHI when using the machine - Encourage patient to use CPAP nightly and > 4 hours each night -Read note from ENT-advised patient that she should try to avoid taking Tylenol and if pain from her throat/tonsils continues she should contact her ENT per his request from his note.  Also advised that she should try to use her CPAP nightly and turn the humidification up as this may also help her symptoms.  She  voiced understanding -Follow-up in 1 year or sooner if needed     Ward Givens, MSN, NP-C 03/10/2021, 4:28 PM [facility] [address] [place], [place] [address] [phone]

## 2021-03-20 DIAGNOSIS — Z20822 Contact with and (suspected) exposure to covid-19: Secondary | ICD-10-CM | POA: Diagnosis not present

## 2021-03-26 ENCOUNTER — Inpatient Hospital Stay (HOSPITAL_COMMUNITY): Payer: 59

## 2021-04-01 ENCOUNTER — Other Ambulatory Visit (HOSPITAL_COMMUNITY): Payer: Self-pay

## 2021-04-01 DIAGNOSIS — F331 Major depressive disorder, recurrent, moderate: Secondary | ICD-10-CM | POA: Diagnosis not present

## 2021-04-01 DIAGNOSIS — E1169 Type 2 diabetes mellitus with other specified complication: Secondary | ICD-10-CM | POA: Diagnosis not present

## 2021-04-01 DIAGNOSIS — Z1331 Encounter for screening for depression: Secondary | ICD-10-CM | POA: Diagnosis not present

## 2021-04-01 DIAGNOSIS — M5430 Sciatica, unspecified side: Secondary | ICD-10-CM | POA: Diagnosis not present

## 2021-04-01 DIAGNOSIS — Z1339 Encounter for screening examination for other mental health and behavioral disorders: Secondary | ICD-10-CM | POA: Diagnosis not present

## 2021-04-01 MED ORDER — PREDNISONE 20 MG PO TABS
40.0000 mg | ORAL_TABLET | Freq: Every day | ORAL | 0 refills | Status: DC
  Filled 2021-04-01: qty 6, 3d supply, fill #0

## 2021-04-02 ENCOUNTER — Ambulatory Visit (HOSPITAL_COMMUNITY): Payer: 59 | Admitting: Physician Assistant

## 2021-04-05 ENCOUNTER — Other Ambulatory Visit (HOSPITAL_COMMUNITY): Payer: Self-pay

## 2021-04-05 MED ORDER — PREDNISONE 20 MG PO TABS
ORAL_TABLET | ORAL | 0 refills | Status: AC
  Filled 2021-04-05: qty 9, 6d supply, fill #0

## 2021-04-23 ENCOUNTER — Inpatient Hospital Stay (HOSPITAL_COMMUNITY): Payer: 59 | Attending: Hematology

## 2021-04-23 DIAGNOSIS — E611 Iron deficiency: Secondary | ICD-10-CM | POA: Diagnosis not present

## 2021-04-23 DIAGNOSIS — D72829 Elevated white blood cell count, unspecified: Secondary | ICD-10-CM | POA: Diagnosis not present

## 2021-04-23 DIAGNOSIS — D75839 Thrombocytosis, unspecified: Secondary | ICD-10-CM | POA: Diagnosis not present

## 2021-04-23 LAB — IRON AND TIBC
Iron: 46 ug/dL (ref 28–170)
Saturation Ratios: 10 % — ABNORMAL LOW (ref 10.4–31.8)
TIBC: 469 ug/dL — ABNORMAL HIGH (ref 250–450)
UIBC: 423 ug/dL

## 2021-04-23 LAB — CBC WITH DIFFERENTIAL/PLATELET
Abs Immature Granulocytes: 0.04 10*3/uL (ref 0.00–0.07)
Basophils Absolute: 0.1 10*3/uL (ref 0.0–0.1)
Basophils Relative: 0 %
Eosinophils Absolute: 0.3 10*3/uL (ref 0.0–0.5)
Eosinophils Relative: 2 %
HCT: 40.8 % (ref 36.0–46.0)
Hemoglobin: 12.6 g/dL (ref 12.0–15.0)
Immature Granulocytes: 0 %
Lymphocytes Relative: 30 %
Lymphs Abs: 4.1 10*3/uL — ABNORMAL HIGH (ref 0.7–4.0)
MCH: 25.1 pg — ABNORMAL LOW (ref 26.0–34.0)
MCHC: 30.9 g/dL (ref 30.0–36.0)
MCV: 81.3 fL (ref 80.0–100.0)
Monocytes Absolute: 0.7 10*3/uL (ref 0.1–1.0)
Monocytes Relative: 5 %
Neutro Abs: 8.5 10*3/uL — ABNORMAL HIGH (ref 1.7–7.7)
Neutrophils Relative %: 63 %
Platelets: 366 10*3/uL (ref 150–400)
RBC: 5.02 MIL/uL (ref 3.87–5.11)
RDW: 15.8 % — ABNORMAL HIGH (ref 11.5–15.5)
WBC: 13.8 10*3/uL — ABNORMAL HIGH (ref 4.0–10.5)
nRBC: 0 % (ref 0.0–0.2)

## 2021-04-23 LAB — C-REACTIVE PROTEIN: CRP: 5.7 mg/dL — ABNORMAL HIGH (ref <1.0)

## 2021-04-23 LAB — SEDIMENTATION RATE: Sed Rate: 28 mm/hr — ABNORMAL HIGH (ref 0–22)

## 2021-04-23 LAB — FERRITIN: Ferritin: 35 ng/mL (ref 11–307)

## 2021-04-24 LAB — RHEUMATOID FACTOR: Rheumatoid fact SerPl-aCnc: <10 IU/mL (ref <14.0)

## 2021-04-26 LAB — ANA: Anti Nuclear Antibody (ANA): NEGATIVE

## 2021-04-29 NOTE — Progress Notes (Signed)
? ?[facility] ?[address] ?[place], [place] [address] ? ? ?CLINIC:  ?Medical Oncology/Hematology ? ?PCP:  ?Velna Hatchet, MD ?[address] ?[place] [address] ?[phone] ? ? ?REASON FOR VISIT:  ?Follow-up for leukocytosis, thrombocytosis, and iron deficiency ? ?PRIOR THERAPY: None ? ?CURRENT THERAPY: Ferrous sulfate 325 mg daily ? ?INTERVAL HISTORY:  ?Karen Hoover 31 y.o. female returns for routine follow-up of her leukocytosis, thrombocytosis, and iron deficiency.  She was last seen by Tarri Abernethy PA-C on 12/30/2020. ? ?At today's visit, she reports feeling fairly well.  No recent hospitalizations, surgeries, or changes in baseline health status. ? ?She has been taking iron tablet daily without any issues. ?She reports that her periods are regular with moderate bleeding.  She denies any other source of blood loss such as hematemesis, hematochezia, melena, or epistaxis.  She denies any fatigue.  No pica, restless legs, headaches, chest pain, dyspnea on exertion, lightheadedness, or syncope. ? ?She had COVID-19 in February 2023, but denies any frequent or recurrent infections.  She continues to have intermittent night sweats from the waist down only, but denies any fever, chills, or unintentional weight loss.  She has not noticed any new lumps or bumps. ? ?She has 90% energy and 100% appetite. She endorses that she is maintaining a stable weight. ? ? ?REVIEW OF SYSTEMS:  ?Review of Systems  ?Constitutional:  Negative for appetite change, chills, diaphoresis, fatigue, fever and unexpected weight change.  ?HENT:   Negative for lump/mass and nosebleeds.   ?Eyes:  Negative for eye problems.  ?Respiratory:  Negative for cough, hemoptysis and shortness of breath.   ?Cardiovascular:  Negative for chest pain, leg swelling and palpitations.  ?Gastrointestinal:  Negative for abdominal pain, blood in stool, constipation, diarrhea, nausea and vomiting.  ?Genitourinary:  Negative for hematuria.    ?Skin: Negative.   ?Neurological:  Positive for numbness (occasional numbness in fingers and toes). Negative for dizziness, headaches and light-headedness.  ?Hematological:  Does not bruise/bleed easily.   ? ? ?PAST MEDICAL/SURGICAL HISTORY:  ?Past Medical History:  ?Diagnosis Date  ? Anemia   ? Anxiety 03/17/2015  ? BV (bacterial vaginosis) 04/10/2014  ? Chronic lower back pain   ? 2 herniated disc sees Dr Carloyn Manner  ? Complication of anesthesia   ? O2 desaturation, Hypotension, Fluid filled lungs  ? Diabetes mellitus without complication ([place])   ? Encounter for menstrual regulation 03/13/2014  ? Family history of breast cancer   ? Family history of melanoma   ? Fatty liver 09/05/2018  ? Gallstones 09/05/2018  ? Refer to Dr Constance Haw  ? Hemorrhoids 01/12/2015  ? History of rectal bleeding 01/12/2015  ? Hypothyroidism   ? IC (interstitial cystitis) 01/22/2015  ? Pulmonary edema 08/21/2015  ? S/P back OR  ? Sleep apnea   ? Thyroid disease   ? Vaginal discharge 03/13/2014  ? Vaginal itching 01/12/2015  ? Vestibulitis, vulvar 04/21/2014  ? Yeast infection 03/13/2014  ? ?Past Surgical History:  ?Procedure Laterality Date  ? BACK SURGERY    ? CHOLECYSTECTOMY N/A 10/17/2018  ? Procedure: LAPAROSCOPIC CHOLECYSTECTOMY;  Surgeon: Virl Cagey, MD;  Location: [facility];  Service: General;  Laterality: N/A;  ? LAMINECTOMY AND MICRODISCECTOMY LUMBAR SPINE Right 08/21/2015  ? L4-5 and L5-S 1  ? ? ? ?SOCIAL HISTORY:  ?Social History  ? ?Socioeconomic History  ? Marital status: Single  ?  Spouse name: Not on file  ? Number of children: Not on file  ? Years of education:  Not on file  ? Highest education level: Not on file  ?Occupational History  ? Occupation: Surgical Instrument cleaner  ?  Employer: [employer]  ?Tobacco Use  ? Smoking status: Never  ? Smokeless tobacco: Never  ?Vaping Use  ? Vaping Use: Never used  ?Substance and Sexual Activity  ? Alcohol use: Yes  ?  Comment: occasionally   ? Drug use: No  ? Sexual activity: Not Currently  ?   Birth control/protection: Pill, Condom  ?Other Topics Concern  ? Not on file  ?Social History Narrative  ? Not on file  ? ?Social Determinants of Health  ? ?Financial Resource Strain: Low Risk   ? Difficulty of Paying Living Expenses: Not very hard  ?Food Insecurity: No Food Insecurity  ? Worried About Charity fundraiser in the Last Year: Never true  ? Ran Out of Food in the Last Year: Never true  ?Transportation Needs: No Transportation Needs  ? Lack of Transportation (Medical): No  ? Lack of Transportation (Non-Medical): No  ?Physical Activity: Insufficiently Active  ? Days of Exercise per Week: 1 day  ? Minutes of Exercise per Session: 60 min  ?Stress: Stress Concern Present  ? Feeling of Stress : To some extent  ?Social Connections: Moderately Integrated  ? Frequency of Communication with Friends and Family: More than three times a week  ? Frequency of Social Gatherings with Friends and Family: Three times a week  ? Attends Religious Services: More than 4 times per year  ? Active Member of Clubs or Organizations: Yes  ? Attends Archivist Meetings: More than 4 times per year  ? Marital Status: Never married  ?Intimate Partner Violence: Not At Risk  ? Fear of Current or Ex-Partner: No  ? Emotionally Abused: No  ? Physically Abused: No  ? Sexually Abused: No  ? ? ?FAMILY HISTORY:  ?Family History  ?Problem Relation Age of Onset  ? Diabetes Mother   ? Thyroid disease Mother   ? Breast cancer Mother 60  ?     CHEK2 pos  ? Thyroid disease Brother   ? Diabetes Maternal Grandmother   ? Melanoma Maternal Grandmother   ?     d. 61  ? Melanoma Maternal Grandfather   ?     d. 68  ? Cancer Paternal Grandmother   ? Breast cancer Cousin 38  ?     mother's pat first cousin  ? Kidney cancer Other   ?     MGF's sister  ? Sleep apnea Neg Hx   ? ? ?CURRENT MEDICATIONS:  ?Outpatient Encounter Medications as of 04/30/2021  ?Medication Sig Note  ? albuterol (VENTOLIN HFA) 108 (90 Base) MCG/ACT inhaler Inhale 2 puffs into  the lungs every 8 (eight) hours as needed for wheezing or shortness of breath. 06/01/2020: Pt states she does not feel like this medication is working.  ? amoxicillin-clavulanate (AUGMENTIN) 875-125 MG tablet Take 1 tablet by mouth 2 (two) times daily.   ? APPLE CIDER VINEGAR PO Take 2 tablets by mouth daily.   ? aspirin EC 81 MG tablet Take 81 mg by mouth daily.   ? azithromycin (ZITHROMAX) 250 MG tablet Take 2 tablets by mouth today then 1 tab daily for 4 more days   ? BLISOVI 24 FE 1-20 MG-MCG(24) tablet Take 1 tablet by mouth daily. Needs office visit   ? Blood Glucose Monitoring Suppl (FREESTYLE LITE) w/Device KIT Use as directed to test 4 times daily   ?  cefdinir (OMNICEF) 300 MG capsule Take 1 capsule (300 mg total) by mouth 2 (two) times daily at breakfast and supper for 7 days   ? cetirizine (ZYRTEC ALLERGY) 10 MG tablet Take 1 tablet (10 mg total) by mouth daily.   ? Cyanocobalamin (VITAMIN B-12) 1000 MCG SUBL Place 1 tablet under the tongue.   ? Dulaglutide (TRULICITY) 1.5 PP/8.9QM SOPN Inject 1.5 mg into the skin once a week.   ? Empagliflozin-metFORMIN HCl ER (SYNJARDY XR) 25-1000 MG TB24 Take 1 tablet by mouth daily with breakfast.   ? Empagliflozin-metFORMIN HCl ER (SYNJARDY XR) 25-1000 MG TB24 Take 1 tablet by mouth daily with breakfast.   ? erythromycin ophthalmic ointment Apply 1 application to affected eye 4 times a day   ? escitalopram (LEXAPRO) 20 MG tablet TAKE 1 TABLET BY MOUTH ONCE DAILY   ? escitalopram (LEXAPRO) 20 MG tablet Take 1 tablet (20 mg total) by mouth daily.   ? escitalopram (LEXAPRO) 20 MG tablet Take 1 tablet (20 mg total) by mouth daily.   ? Esketamine HCl, 56 MG Dose, (SPRAVATO, 56 MG DOSE,) 28 MG/DEVICE SOPK mornings   ? Ferrous Sulfate (IRON PO) Take 1 tablet by mouth daily.   ? fluconazole (DIFLUCAN) 150 MG tablet Take 1 tablet (150 mg total) by mouth once as needed for up to one dose   ? fluticasone (FLONASE) 50 MCG/ACT nasal spray Place 2 sprays into both nostrils daily.    ? fluticasone (FLONASE) 50 MCG/ACT nasal spray Place into the nose.   ? FREESTYLE LITE test strip 1 each 2 (two) times daily.   ? gabapentin (NEURONTIN) 300 MG capsule Take 300 mg by mouth daily as need

## 2021-04-30 ENCOUNTER — Inpatient Hospital Stay (HOSPITAL_BASED_OUTPATIENT_CLINIC_OR_DEPARTMENT_OTHER): Payer: 59 | Admitting: Physician Assistant

## 2021-04-30 ENCOUNTER — Other Ambulatory Visit: Payer: Self-pay

## 2021-04-30 VITALS — BP 129/83 | HR 96 | Temp 98.5°F | Resp 16 | Ht 65.0 in | Wt 252.9 lb

## 2021-04-30 DIAGNOSIS — D75839 Thrombocytosis, unspecified: Secondary | ICD-10-CM | POA: Diagnosis not present

## 2021-04-30 DIAGNOSIS — E611 Iron deficiency: Secondary | ICD-10-CM | POA: Diagnosis not present

## 2021-04-30 DIAGNOSIS — D72829 Elevated white blood cell count, unspecified: Secondary | ICD-10-CM

## 2021-04-30 NOTE — Patient Instructions (Addendum)
[place] [facility] ?Discharge Instructions ? ?You were seen today by Tarri Abernethy PA-C for your blood abnormalities, as discussed below. ? ?ELEVATED WHITE BLOOD CELLS & PLATELETS: We checked several tests for your elevated white blood cells and platelets, which showed that you do not have any type of blood cancer.  Your inflammatory markers were high, which makes [place] suspect that your white blood cells and platelets are elevated due to underlying inflammation in your body.  This may be due to obesity, or other inflammatory causes. ? ?IRON DEFICIENCY: Your iron levels are still low, but are improved from your previous levels.  Continue to take your over-the-counter iron supplement (ferrous sulfate 325 mg) once daily.   ? ?FAMILY HISTORY OF BREAST CANCER: I have printed your report from your genetic counselor visit in 2019.  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  Please read through the geneticist report for further recommendations and discuss this with your primary care provider and gynecologist. ? ?LABS: Return in 6 months for repeat labs ? ?OTHER TESTS: None at this time ? ?MEDICATIONS: Iron supplementation as above ? ?FOLLOW-UP APPOINTMENT: Office visit in 6 months, after labs ? ? ?Thank you for choosing [facility] to provide your oncology and hematology care.  To afford each patient quality time with our provider, please arrive at least 15 minutes before your scheduled appointment time.  ? ?If you have a lab appointment with the [facility] please come in thru the Main Entrance and check in at the main information desk. ? ?You need to re-schedule your appointment should you arrive 10 or more minutes late.  We strive to give you quality time with our providers, and arriving late affects you and other patients whose appointments are after yours.  Also, if you no show three or more times for appointments you may be  dismissed from the clinic at the providers discretion.     ?Again, thank you for choosing [facility].  Our hope is that these requests will decrease the amount of time that you wait before being seen by our physicians.       ?_____________________________________________________________ ? ?Should you have questions after your visit to [facility], please contact our office at [phone] and follow the prompts.  Our office hours are 8:00 a.m. and 4:30 p.m. Monday - Friday.  Please note that voicemails left after 4:00 p.m. may not be returned until the following business day.  We are closed weekends and major holidays.  You do have access to a nurse 24-7, just call the main number to the clinic [phone] and do not press any options, hold on the line and a nurse will answer the phone.   ? ?For prescription refill requests, have your pharmacy contact our office and allow 72 hours.   ? ?Due to Covid, you will need to wear a mask upon entering the hospital. If you do not have a mask, a mask will be given to you at the Main Entrance upon arrival. For doctor visits, patients may have 1 support person age 57 or older with them. For treatment visits, patients can not have anyone with them due to social distancing guidelines and our immunocompromised population.  ? ? ? ?

## 2021-05-10 DIAGNOSIS — F4323 Adjustment disorder with mixed anxiety and depressed mood: Secondary | ICD-10-CM | POA: Diagnosis not present

## 2021-05-11 ENCOUNTER — Other Ambulatory Visit (HOSPITAL_COMMUNITY): Payer: Self-pay

## 2021-05-18 DIAGNOSIS — G4733 Obstructive sleep apnea (adult) (pediatric): Secondary | ICD-10-CM | POA: Diagnosis not present

## 2021-05-31 ENCOUNTER — Other Ambulatory Visit (HOSPITAL_COMMUNITY): Payer: Self-pay

## 2021-05-31 DIAGNOSIS — R5383 Other fatigue: Secondary | ICD-10-CM | POA: Diagnosis not present

## 2021-05-31 DIAGNOSIS — Z1152 Encounter for screening for COVID-19: Secondary | ICD-10-CM | POA: Diagnosis not present

## 2021-05-31 DIAGNOSIS — Z8616 Personal history of COVID-19: Secondary | ICD-10-CM | POA: Diagnosis not present

## 2021-05-31 DIAGNOSIS — G4733 Obstructive sleep apnea (adult) (pediatric): Secondary | ICD-10-CM | POA: Diagnosis not present

## 2021-05-31 DIAGNOSIS — J3501 Chronic tonsillitis: Secondary | ICD-10-CM | POA: Diagnosis not present

## 2021-05-31 DIAGNOSIS — J029 Acute pharyngitis, unspecified: Secondary | ICD-10-CM | POA: Diagnosis not present

## 2021-05-31 MED ORDER — AMOXICILLIN 875 MG PO TABS
875.0000 mg | ORAL_TABLET | Freq: Two times a day (BID) | ORAL | 0 refills | Status: DC
  Filled 2021-05-31: qty 20, 10d supply, fill #0

## 2021-06-01 DIAGNOSIS — F4323 Adjustment disorder with mixed anxiety and depressed mood: Secondary | ICD-10-CM | POA: Diagnosis not present

## 2021-06-15 ENCOUNTER — Other Ambulatory Visit (HOSPITAL_COMMUNITY): Payer: Self-pay

## 2021-06-16 ENCOUNTER — Other Ambulatory Visit (HOSPITAL_COMMUNITY): Payer: Self-pay

## 2021-06-17 ENCOUNTER — Other Ambulatory Visit (HOSPITAL_COMMUNITY): Payer: Self-pay

## 2021-06-17 DIAGNOSIS — J3489 Other specified disorders of nose and nasal sinuses: Secondary | ICD-10-CM | POA: Diagnosis not present

## 2021-06-17 DIAGNOSIS — K219 Gastro-esophageal reflux disease without esophagitis: Secondary | ICD-10-CM | POA: Diagnosis not present

## 2021-06-17 DIAGNOSIS — G4733 Obstructive sleep apnea (adult) (pediatric): Secondary | ICD-10-CM | POA: Diagnosis not present

## 2021-06-17 DIAGNOSIS — Z9989 Dependence on other enabling machines and devices: Secondary | ICD-10-CM | POA: Diagnosis not present

## 2021-06-17 DIAGNOSIS — Z8709 Personal history of other diseases of the respiratory system: Secondary | ICD-10-CM | POA: Diagnosis not present

## 2021-06-17 MED ORDER — OMEPRAZOLE 20 MG PO CPDR
20.0000 mg | DELAYED_RELEASE_CAPSULE | Freq: Two times a day (BID) | ORAL | 3 refills | Status: DC
  Filled 2021-06-17: qty 60, 30d supply, fill #0
  Filled 2021-07-26: qty 60, 30d supply, fill #1

## 2021-06-18 ENCOUNTER — Other Ambulatory Visit (HOSPITAL_COMMUNITY): Payer: Self-pay

## 2021-06-21 ENCOUNTER — Other Ambulatory Visit (HOSPITAL_COMMUNITY): Payer: Self-pay

## 2021-06-21 DIAGNOSIS — G4733 Obstructive sleep apnea (adult) (pediatric): Secondary | ICD-10-CM | POA: Diagnosis not present

## 2021-06-21 DIAGNOSIS — K219 Gastro-esophageal reflux disease without esophagitis: Secondary | ICD-10-CM | POA: Diagnosis not present

## 2021-06-21 DIAGNOSIS — J3501 Chronic tonsillitis: Secondary | ICD-10-CM | POA: Diagnosis not present

## 2021-06-21 DIAGNOSIS — J351 Hypertrophy of tonsils: Secondary | ICD-10-CM | POA: Diagnosis not present

## 2021-06-21 MED ORDER — CEFDINIR 300 MG PO CAPS
300.0000 mg | ORAL_CAPSULE | Freq: Two times a day (BID) | ORAL | 0 refills | Status: AC
  Filled 2021-06-21: qty 9, 4d supply, fill #0
  Filled 2021-06-21: qty 11, 6d supply, fill #0

## 2021-06-28 DIAGNOSIS — F4323 Adjustment disorder with mixed anxiety and depressed mood: Secondary | ICD-10-CM | POA: Diagnosis not present

## 2021-07-14 DIAGNOSIS — F4323 Adjustment disorder with mixed anxiety and depressed mood: Secondary | ICD-10-CM | POA: Diagnosis not present

## 2021-07-26 ENCOUNTER — Other Ambulatory Visit (HOSPITAL_COMMUNITY): Payer: Self-pay

## 2021-07-27 ENCOUNTER — Other Ambulatory Visit (HOSPITAL_COMMUNITY): Payer: Self-pay

## 2021-07-28 DIAGNOSIS — F4323 Adjustment disorder with mixed anxiety and depressed mood: Secondary | ICD-10-CM | POA: Diagnosis not present

## 2021-07-29 ENCOUNTER — Other Ambulatory Visit (HOSPITAL_COMMUNITY): Payer: Self-pay

## 2021-08-04 ENCOUNTER — Other Ambulatory Visit (HOSPITAL_COMMUNITY): Payer: Self-pay

## 2021-08-04 MED ORDER — TRULICITY 1.5 MG/0.5ML ~~LOC~~ SOAJ
1.5000 mg | SUBCUTANEOUS | 6 refills | Status: DC
  Filled 2021-08-04: qty 2, 28d supply, fill #0
  Filled 2021-09-20: qty 6, 84d supply, fill #1
  Filled 2021-12-13: qty 6, 84d supply, fill #2

## 2021-08-11 DIAGNOSIS — G4733 Obstructive sleep apnea (adult) (pediatric): Secondary | ICD-10-CM | POA: Diagnosis not present

## 2021-08-17 ENCOUNTER — Other Ambulatory Visit (HOSPITAL_COMMUNITY): Payer: Self-pay

## 2021-08-17 DIAGNOSIS — G4733 Obstructive sleep apnea (adult) (pediatric): Secondary | ICD-10-CM | POA: Diagnosis not present

## 2021-08-17 DIAGNOSIS — J3501 Chronic tonsillitis: Secondary | ICD-10-CM | POA: Diagnosis not present

## 2021-08-17 DIAGNOSIS — E1169 Type 2 diabetes mellitus with other specified complication: Secondary | ICD-10-CM | POA: Diagnosis not present

## 2021-08-17 DIAGNOSIS — J029 Acute pharyngitis, unspecified: Secondary | ICD-10-CM | POA: Diagnosis not present

## 2021-08-17 MED ORDER — CEFDINIR 300 MG PO CAPS
300.0000 mg | ORAL_CAPSULE | Freq: Two times a day (BID) | ORAL | 0 refills | Status: DC
  Filled 2021-08-17: qty 14, 7d supply, fill #0

## 2021-08-20 ENCOUNTER — Ambulatory Visit (INDEPENDENT_AMBULATORY_CARE_PROVIDER_SITE_OTHER): Payer: 59 | Admitting: Adult Health

## 2021-08-20 ENCOUNTER — Encounter: Payer: Self-pay | Admitting: Adult Health

## 2021-08-20 ENCOUNTER — Other Ambulatory Visit (HOSPITAL_COMMUNITY): Payer: Self-pay

## 2021-08-20 VITALS — BP 122/77 | HR 84 | Ht 65.25 in | Wt 248.0 lb

## 2021-08-20 DIAGNOSIS — Z01419 Encounter for gynecological examination (general) (routine) without abnormal findings: Secondary | ICD-10-CM | POA: Insufficient documentation

## 2021-08-20 DIAGNOSIS — Z30011 Encounter for initial prescription of contraceptive pills: Secondary | ICD-10-CM | POA: Diagnosis not present

## 2021-08-20 DIAGNOSIS — Z3202 Encounter for pregnancy test, result negative: Secondary | ICD-10-CM | POA: Diagnosis not present

## 2021-08-20 LAB — POCT URINE PREGNANCY: Preg Test, Ur: NEGATIVE

## 2021-08-20 MED ORDER — NORETHINDRONE ACET-ETHINYL EST 1-20 MG-MCG PO TABS
ORAL_TABLET | ORAL | 4 refills | Status: DC
Start: 2021-08-20 — End: 2022-10-17
  Filled 2021-08-20: qty 84, 84d supply, fill #0
  Filled 2021-11-14: qty 84, 84d supply, fill #1
  Filled 2022-02-15: qty 84, 84d supply, fill #2
  Filled 2022-05-16: qty 84, 84d supply, fill #3
  Filled 2022-07-15 – 2022-07-26 (×2): qty 84, 84d supply, fill #4

## 2021-08-20 NOTE — Progress Notes (Signed)
Patient ID: Karen Hoover, female   DOB: 1990-07-17, 31 y.o.   MRN: [id number] History of Present Illness: Karen Hoover is a 31 year old white female, single, G0P0, in for a well woman gyn exam and to get back on OCs, has been off for about 3 months. Has some UI. Lab Results  Component Value Date   DIAGPAP  07/17/2019    - Negative for intraepithelial lesion or malignancy (NILM)   HPVHIGH Negative 07/17/2019    PCP is Dr Karen Hoover.   Current Medications, Allergies, Past Medical History, Past Surgical History, Family History and Social History were reviewed in Karen Hoover record.     Review of Systems: Patient denies any headaches, hearing loss, fatigue, blurred vision, shortness of breath, chest pain, abdominal pain, problems with bowel movements, urination,(has UI) or intercourse,(nota active). No joint pain or Hoover swings.     Physical Exam:BP 122/77 (BP Location: Left Arm, Patient Position: Sitting, Cuff Size: Large)   Pulse 84   Ht 5' 5.25" (1.657 m)   Wt 248 lb (112.5 kg)   LMP 08/15/2021   BMI 40.95 kg/m  UPT is negative  General:  Well developed, well nourished, no acute distress Skin:  Warm and dry Neck:  Midline trachea, normal thyroid, good ROM, no lymphadenopathy Lungs; Clear to auscultation bilaterally Breast:  No dominant palpable mass, retraction, or nipple discharge Cardiovascular: Regular rate and rhythm Abdomen:  Soft, non tender, no hepatosplenomegaly Pelvic:  External genitalia is normal in appearance, no lesions.  The vagina is normal in appearance. Urethra has no lesions or masses. The cervix is nulliparous.  Uterus is felt to be normal size, shape, and contour.  No adnexal masses or tenderness noted.Bladder is non tender, no masses felt. Extremities/musculoskeletal:  No swelling or varicosities noted, no clubbing or cyanosis Psych:  No Hoover changes, alert and cooperative,seems happy AA is 2 Fall risk is low    08/20/2021   12:07 PM 07/30/2020     9:42 AM 07/17/2019   11:13 AM  Depression screen PHQ 2/9  Decreased Interest 1 1 0  Down, Depressed, Hopeless 1 2 0  PHQ - 2 Score 2 3 0  Altered sleeping 0 1 0  Tired, decreased energy 1 1 0  Change in appetite 0 1 1  Feeling bad or failure about yourself  1 1 0  Trouble concentrating 1 1 0  Moving slowly or fidgety/restless 0 1 0  Suicidal thoughts 0 0 0  PHQ-9 Score 5 9 1   Difficult doing work/chores   Not difficult at all   On lexapro    08/20/2021   12:07 PM 07/30/2020    9:43 AM 07/17/2019   11:16 AM  GAD 7 : Generalized Anxiety Score  Nervous, Anxious, on Edge 1 2 1   Control/stop worrying 2 2 1   Worry too much - different things 2 2 1   Trouble relaxing 1 1 0  Restless 0 2 0  Easily annoyed or irritable 2 3 1   Afraid - awful might happen 2 1 0  Total GAD 7 Score 10 13 4   Anxiety Difficulty   Not difficult at all    Upstream - 08/20/21 1155       Pregnancy Intention Screening   Does the patient want to become pregnant in the next year? No    Does the patient's partner want to become pregnant in the next year? No    Would the patient like to discuss contraceptive options today? Yes  Contraception Wrap Up   Current Method Abstinence    End Method Oral Contraceptive    Contraception Counseling Provided Yes              Examination chaperoned by Karen Mood LPN  Impression and Plan  1. Pregnancy examination or test, negative result  2. Encounter for well woman exam with routine gynecological exam Pap and physical in 1 year Labs with PCP   3. Encounter for initial prescription of contraceptive pills Will rx loestrin 1/20 Meds ordered this encounter  Medications   norethindrone-ethinyl estradiol (LOESTRIN) 1-20 MG-MCG tablet    Sig: Take 1 daily continuously, no inert pills    Dispense:  84 tablet    Refill:  4    Order Specific Question:   Supervising Provider    Answer:   Karen Hoover [[id number]]   Start Sunday

## 2021-08-23 DIAGNOSIS — E1169 Type 2 diabetes mellitus with other specified complication: Secondary | ICD-10-CM | POA: Diagnosis not present

## 2021-08-23 DIAGNOSIS — F331 Major depressive disorder, recurrent, moderate: Secondary | ICD-10-CM | POA: Diagnosis not present

## 2021-08-23 DIAGNOSIS — J3501 Chronic tonsillitis: Secondary | ICD-10-CM | POA: Diagnosis not present

## 2021-08-23 DIAGNOSIS — J351 Hypertrophy of tonsils: Secondary | ICD-10-CM | POA: Diagnosis not present

## 2021-08-27 DIAGNOSIS — F4323 Adjustment disorder with mixed anxiety and depressed mood: Secondary | ICD-10-CM | POA: Diagnosis not present

## 2021-09-01 DIAGNOSIS — F4323 Adjustment disorder with mixed anxiety and depressed mood: Secondary | ICD-10-CM | POA: Diagnosis not present

## 2021-09-10 DIAGNOSIS — K219 Gastro-esophageal reflux disease without esophagitis: Secondary | ICD-10-CM | POA: Diagnosis not present

## 2021-09-10 DIAGNOSIS — R0989 Other specified symptoms and signs involving the circulatory and respiratory systems: Secondary | ICD-10-CM | POA: Diagnosis not present

## 2021-09-10 DIAGNOSIS — Z8709 Personal history of other diseases of the respiratory system: Secondary | ICD-10-CM | POA: Diagnosis not present

## 2021-09-20 ENCOUNTER — Other Ambulatory Visit (HOSPITAL_COMMUNITY): Payer: Self-pay

## 2021-09-20 DIAGNOSIS — J0391 Acute recurrent tonsillitis, unspecified: Secondary | ICD-10-CM | POA: Diagnosis not present

## 2021-09-22 DIAGNOSIS — F4323 Adjustment disorder with mixed anxiety and depressed mood: Secondary | ICD-10-CM | POA: Diagnosis not present

## 2021-10-04 DIAGNOSIS — G4730 Sleep apnea, unspecified: Secondary | ICD-10-CM | POA: Diagnosis not present

## 2021-10-04 DIAGNOSIS — E119 Type 2 diabetes mellitus without complications: Secondary | ICD-10-CM | POA: Diagnosis not present

## 2021-10-04 DIAGNOSIS — J3501 Chronic tonsillitis: Secondary | ICD-10-CM | POA: Diagnosis not present

## 2021-10-04 DIAGNOSIS — J351 Hypertrophy of tonsils: Secondary | ICD-10-CM | POA: Diagnosis not present

## 2021-10-04 DIAGNOSIS — E039 Hypothyroidism, unspecified: Secondary | ICD-10-CM | POA: Diagnosis not present

## 2021-10-04 DIAGNOSIS — Z6841 Body Mass Index (BMI) 40.0 and over, adult: Secondary | ICD-10-CM | POA: Diagnosis not present

## 2021-10-04 DIAGNOSIS — J0391 Acute recurrent tonsillitis, unspecified: Secondary | ICD-10-CM | POA: Diagnosis not present

## 2021-10-05 DIAGNOSIS — Z6841 Body Mass Index (BMI) 40.0 and over, adult: Secondary | ICD-10-CM | POA: Diagnosis not present

## 2021-10-05 DIAGNOSIS — E039 Hypothyroidism, unspecified: Secondary | ICD-10-CM | POA: Diagnosis not present

## 2021-10-05 DIAGNOSIS — G4730 Sleep apnea, unspecified: Secondary | ICD-10-CM | POA: Diagnosis not present

## 2021-10-05 DIAGNOSIS — J0391 Acute recurrent tonsillitis, unspecified: Secondary | ICD-10-CM | POA: Diagnosis not present

## 2021-10-05 DIAGNOSIS — E119 Type 2 diabetes mellitus without complications: Secondary | ICD-10-CM | POA: Diagnosis not present

## 2021-10-15 DIAGNOSIS — F4323 Adjustment disorder with mixed anxiety and depressed mood: Secondary | ICD-10-CM | POA: Diagnosis not present

## 2021-10-20 ENCOUNTER — Other Ambulatory Visit (HOSPITAL_COMMUNITY): Payer: Self-pay

## 2021-10-20 MED ORDER — AZITHROMYCIN 250 MG PO TABS
ORAL_TABLET | ORAL | 0 refills | Status: AC
  Filled 2021-10-20: qty 6, 5d supply, fill #0

## 2021-10-20 MED ORDER — BENZONATATE 100 MG PO CAPS
100.0000 mg | ORAL_CAPSULE | Freq: Two times a day (BID) | ORAL | 0 refills | Status: DC | PRN
  Filled 2021-10-20: qty 15, 8d supply, fill #0

## 2021-10-25 ENCOUNTER — Inpatient Hospital Stay: Payer: 59

## 2021-10-27 ENCOUNTER — Inpatient Hospital Stay: Payer: 59 | Attending: Physician Assistant

## 2021-10-27 DIAGNOSIS — E611 Iron deficiency: Secondary | ICD-10-CM

## 2021-10-27 DIAGNOSIS — D509 Iron deficiency anemia, unspecified: Secondary | ICD-10-CM | POA: Diagnosis present

## 2021-10-27 DIAGNOSIS — Z793 Long term (current) use of hormonal contraceptives: Secondary | ICD-10-CM | POA: Diagnosis not present

## 2021-10-27 DIAGNOSIS — Z7984 Long term (current) use of oral hypoglycemic drugs: Secondary | ICD-10-CM | POA: Diagnosis not present

## 2021-10-27 DIAGNOSIS — F419 Anxiety disorder, unspecified: Secondary | ICD-10-CM | POA: Insufficient documentation

## 2021-10-27 DIAGNOSIS — Z79899 Other long term (current) drug therapy: Secondary | ICD-10-CM | POA: Diagnosis not present

## 2021-10-27 DIAGNOSIS — Z7985 Long-term (current) use of injectable non-insulin antidiabetic drugs: Secondary | ICD-10-CM | POA: Insufficient documentation

## 2021-10-27 DIAGNOSIS — D72829 Elevated white blood cell count, unspecified: Secondary | ICD-10-CM | POA: Insufficient documentation

## 2021-10-27 DIAGNOSIS — D75839 Thrombocytosis, unspecified: Secondary | ICD-10-CM | POA: Insufficient documentation

## 2021-10-27 DIAGNOSIS — E119 Type 2 diabetes mellitus without complications: Secondary | ICD-10-CM | POA: Diagnosis not present

## 2021-10-27 DIAGNOSIS — F4323 Adjustment disorder with mixed anxiety and depressed mood: Secondary | ICD-10-CM | POA: Diagnosis not present

## 2021-10-27 DIAGNOSIS — Z7982 Long term (current) use of aspirin: Secondary | ICD-10-CM | POA: Diagnosis not present

## 2021-10-27 DIAGNOSIS — G4730 Sleep apnea, unspecified: Secondary | ICD-10-CM | POA: Diagnosis not present

## 2021-10-27 DIAGNOSIS — E039 Hypothyroidism, unspecified: Secondary | ICD-10-CM | POA: Insufficient documentation

## 2021-10-27 LAB — CBC WITH DIFFERENTIAL/PLATELET
Abs Immature Granulocytes: 0.03 10*3/uL (ref 0.00–0.07)
Basophils Absolute: 0.1 10*3/uL (ref 0.0–0.1)
Basophils Relative: 1 %
Eosinophils Absolute: 0.3 10*3/uL (ref 0.0–0.5)
Eosinophils Relative: 2 %
HCT: 40.1 % (ref 36.0–46.0)
Hemoglobin: 12.4 g/dL (ref 12.0–15.0)
Immature Granulocytes: 0 %
Lymphocytes Relative: 26 %
Lymphs Abs: 3.3 10*3/uL (ref 0.7–4.0)
MCH: 25 pg — ABNORMAL LOW (ref 26.0–34.0)
MCHC: 30.9 g/dL (ref 30.0–36.0)
MCV: 80.8 fL (ref 80.0–100.0)
Monocytes Absolute: 0.6 10*3/uL (ref 0.1–1.0)
Monocytes Relative: 5 %
Neutro Abs: 8.7 10*3/uL — ABNORMAL HIGH (ref 1.7–7.7)
Neutrophils Relative %: 66 %
Platelets: 413 10*3/uL — ABNORMAL HIGH (ref 150–400)
RBC: 4.96 MIL/uL (ref 3.87–5.11)
RDW: 14.6 % (ref 11.5–15.5)
WBC: 13 10*3/uL — ABNORMAL HIGH (ref 4.0–10.5)
nRBC: 0 % (ref 0.0–0.2)

## 2021-10-27 LAB — IRON AND TIBC
Iron: 44 ug/dL (ref 28–170)
Saturation Ratios: 10 % — ABNORMAL LOW (ref 10.4–31.8)
TIBC: 459 ug/dL — ABNORMAL HIGH (ref 250–450)
UIBC: 415 ug/dL

## 2021-10-27 LAB — FERRITIN: Ferritin: 44 ng/mL (ref 11–307)

## 2021-10-30 NOTE — Progress Notes (Unsigned)
[place] [place], [place] [address]   CLINIC:  Medical Oncology/Hematology  PCP:  Velna Hatchet, [place] [address] [phone]   REASON FOR VISIT:  Follow-up for leukocytosis, thrombocytosis, and iron deficiency  PRIOR THERAPY: None  CURRENT THERAPY: Ferrous sulfate 325 mg daily  INTERVAL HISTORY:  Ms. Norgard 31 y.o. female returns for routine follow-up of her leukocytosis, thrombocytosis, and iron deficiency.  She was last seen by Tarri Abernethy PA-C on 04/30/2021.  At today's visit, she reports feeling well.  No recent hospitalizations, surgeries, or changes in baseline health status.  She has been taking iron tablet daily without any issues.  She reports that her periods are regular with moderate bleeding - she is taking lo-loestrin to help decrease her bleeding. She denies any other source of blood loss such as hematemesis, hematochezia, melena, or epistaxis. She denies any fatigue. No pica, restless legs, headaches, chest pain, dyspnea on exertion, lightheadedness, or syncope.  She had COVID-19 in February 2023, but denies any frequent or recurrent infections.   She continues to have intermittent night sweats from the waist down only, but denies any fever, chills, or unintentional weight loss.  She has not noticed any new lumps or bumps.  She has 100% energy and 100% appetite. She endorses that she is maintaining a stable weight.   REVIEW OF SYSTEMS:  Review of Systems  Constitutional:  Negative for appetite change, chills, diaphoresis, fatigue, fever and unexpected weight change.  HENT:   Negative for lump/mass and nosebleeds.   Eyes:  Negative for eye problems.  Respiratory:  Negative for cough, hemoptysis and shortness of breath.   Cardiovascular:  Negative for chest pain, leg swelling and palpitations.  Gastrointestinal:  Negative for abdominal pain, blood in stool, constipation, diarrhea, nausea and  vomiting.  Genitourinary:  Negative for hematuria.   Musculoskeletal:  Positive for myalgias (leg cramps).  Skin: Negative.   Neurological:  Positive for numbness (occasional numbness in fingers and toes). Negative for dizziness, headaches and light-headedness.  Hematological:  Does not bruise/bleed easily.  Psychiatric/Behavioral:  Positive for depression. The patient is nervous/anxious.       PAST MEDICAL/SURGICAL HISTORY:  Past Medical History:  Diagnosis Date   Anemia    Anxiety 03/17/2015   BV (bacterial vaginosis) 04/10/2014   Chronic lower back pain    2 herniated disc sees Dr Carloyn Manner   Complication of anesthesia    O2 desaturation, Hypotension, Fluid filled lungs   Diabetes mellitus without complication ([place])    Encounter for menstrual regulation 03/13/2014   Family history of breast cancer    Family history of melanoma    Fatty liver 09/05/2018   Gallstones 09/05/2018   Refer to Dr Constance Haw   Hemorrhoids 01/12/2015   History of rectal bleeding 01/12/2015   Hypothyroidism    IC (interstitial cystitis) 01/22/2015   Pulmonary edema 08/21/2015   S/P back OR   Sleep apnea    Thyroid disease    Tonsillitis    Vaginal discharge 03/13/2014   Vaginal itching 01/12/2015   Vestibulitis, vulvar 04/21/2014   Yeast infection 03/13/2014   Past Surgical History:  Procedure Laterality Date   BACK SURGERY     CHOLECYSTECTOMY N/A 10/17/2018   Procedure: LAPAROSCOPIC CHOLECYSTECTOMY;  Surgeon: Virl Cagey, MD;  Location: [facility];  Service: General;  Laterality: N/A;   LAMINECTOMY AND MICRODISCECTOMY LUMBAR SPINE Right 08/21/2015   L4-5 and L5-S 1     SOCIAL HISTORY:  Social History   Socioeconomic History   Marital status: Single    Spouse name: Not on file   Number of children: Not on file   Years of education: Not on file   Highest education level: Not on file  Occupational History   Occupation: Surgical Instrument cleaner    Employer: [employer]  Tobacco  Use   Smoking status: Never   Smokeless tobacco: Never  Vaping Use   Vaping Use: Never used  Substance and Sexual Activity   Alcohol use: Yes    Comment: occasionally    Drug use: No   Sexual activity: Not Currently    Birth control/protection: Condom  Other Topics Concern   Not on file  Social History Narrative   Not on file   Social Determinants of Health   Financial Resource Strain: Medium Risk (08/20/2021)   Overall Financial Resource Strain (CARDIA)    Difficulty of Paying Living Expenses: Somewhat hard  Food Insecurity: Food Insecurity Present (08/20/2021)   Hunger Vital Sign    Worried About Running Out of Food in the Last Year: Sometimes true    Ran Out of Food in the Last Year: Never true  Transportation Needs: No Transportation Needs (08/20/2021)   PRAPARE - Hydrologist (Medical): No    Lack of Transportation (Non-Medical): No  Physical Activity: Sufficiently Active (08/20/2021)   Exercise Vital Sign    Days of Exercise per Week: 7 days    Minutes of Exercise per Session: 30 min  Stress: Stress Concern Present (08/20/2021)   [place]    Feeling of Stress : To some extent  Social Connections: Moderately Integrated (08/20/2021)   Social Connection and Isolation Panel [NHANES]    Frequency of Communication with Friends and Family: More than three times a week    Frequency of Social Gatherings with Friends and Family: Once a week    Attends Religious Services: More than 4 times per year    Active Member of [employer] or Organizations: Yes    Attends Music therapist: More than 4 times per year    Marital Status: Never married  Intimate Partner Violence: Not At Risk (08/20/2021)   Humiliation, Afraid, Rape, and Kick questionnaire    Fear of Current or Ex-Partner: No    Emotionally Abused: No    Physically Abused: No    Sexually Abused: No    FAMILY HISTORY:   Family History  Problem Relation Age of Onset   Diabetes Mother    Thyroid disease Mother    Breast cancer Mother 82       CHEK2 pos   Thyroid disease Brother    Diabetes Maternal Grandmother    Melanoma Maternal Grandmother        d. 19   Melanoma Maternal Grandfather        d. 29   Cancer Paternal Grandmother    Breast cancer Cousin 52       mother's pat first cousin   Kidney cancer Other        MGF's sister   Sleep apnea Neg Hx     CURRENT MEDICATIONS:  Outpatient Encounter Medications as of 11/01/2021  Medication Sig Note   albuterol (VENTOLIN HFA) 108 (90 Base) MCG/ACT inhaler Inhale 2 puffs into the lungs every 8 (eight) hours as needed for wheezing or shortness of breath. 06/01/2020: Pt states she does not feel like this medication is working.  aspirin EC 81 MG tablet Take 81 mg by mouth daily.    benzonatate (TESSALON) 100 MG capsule Take 1 capsule (100 mg total) by mouth 2 (two) times daily as needed for cough.    Blood Glucose Monitoring Suppl (FREESTYLE LITE) w/Device KIT Use as directed to test 4 times daily    cefdinir (OMNICEF) 300 MG capsule Take 1 capsule (300 mg total) by mouth 2 (two) times daily with breakfast and supper as directed    cetirizine (ZYRTEC ALLERGY) 10 MG tablet Take 1 tablet (10 mg total) by mouth daily. (Patient taking differently: Take 10 mg by mouth.)    Cyanocobalamin (VITAMIN B-12) 1000 MCG SUBL Place 1 tablet under the tongue.    Dulaglutide (TRULICITY) 1.5 OL/0.7EM SOPN Inject 1.5 mg into the skin once a week.    Empagliflozin-metFORMIN HCl ER (SYNJARDY XR) 25-1000 MG TB24 Take 1 tablet by mouth daily with breakfast.    escitalopram (LEXAPRO) 20 MG tablet TAKE 1 TABLET BY MOUTH ONCE DAILY    Esketamine HCl, 56 MG Dose, (SPRAVATO, 56 MG DOSE,) 28 MG/DEVICE SOPK mornings    Ferrous Sulfate (IRON PO) Take 1 tablet by mouth daily.    fluticasone (FLONASE) 50 MCG/ACT nasal spray Place 2 sprays into both nostrils daily.    gabapentin (NEURONTIN)  300 MG capsule Take 300 mg by mouth daily as needed (pain).    glucose blood (FREESTYLE LITE) test strip Use to test blood sugar 4 times daily    glucose blood test strip Use to test 2 times daily.    guaiFENesin-codeine (ROBITUSSIN AC) 100-10 MG/5ML syrup Take 5 mLs by mouth 3 (three) times daily as needed for cough.    HYDROcodone-acetaminophen (NORCO/VICODIN) 5-325 MG tablet Take 1 tablet by mouth 3 (three) times daily for 5 days as needed for severe pain.    Ibuprofen 200 MG CAPS Take 800 mg by mouth every 8 (eight) hours as needed for moderate pain.     Lancets (FREESTYLE) lancets Use to test blood sugar 4 times daily    levothyroxine (SYNTHROID) 100 MCG tablet Take 1 tablet (100 mcg total) by mouth in the morning on an empty stomach    levothyroxine (SYNTHROID) 75 MCG tablet TAKE 1 TABLET BY MOUTH DAILY EVERY MORNING    methocarbamol (ROBAXIN) 500 MG tablet Take 1 tablet (500 mg total) by mouth every 8 (eight) hours.    Multiple Vitamin (MULTIVITAMIN WITH MINERALS) TABS tablet Take 1 tablet by mouth daily. OTC    naproxen (NAPROSYN) 500 MG tablet Take 1 tablet (500 mg total) by mouth 2 (two) times daily with a meal.    norethindrone-ethinyl estradiol (LOESTRIN) 1-20 MG-MCG tablet Take 1 daily continuously, no inert pills    omeprazole (PRILOSEC) 20 MG capsule Take 1 capsule (20 mg total) by mouth 2 (two) times daily before meals. Take 30 to 60 minutes before meals    OneTouch Delica Lancets 75Q MISC Use to self monitor blood glucose twice daily (DX: ICD10: R73.01)    sertraline (ZOLOFT) 25 MG tablet TAKE 1 TABLET BY MOUTH ONCE DAILY.    Zinc Sulfate (ZINC 15 PO) Take by mouth. OTC    No facility-administered encounter medications on file as of 11/01/2021.    ALLERGIES:  Allergies  Allergen Reactions   Metformin Hcl Er Other (See Comments)    diarrhea     PHYSICAL EXAM:  ECOG PERFORMANCE STATUS: 0 - Asymptomatic  There were no vitals filed for this visit. There were no vitals  filed for  this visit. Physical Exam Constitutional:      Appearance: Normal appearance. She is morbidly obese.  HENT:     Head: Normocephalic and atraumatic.     Mouth/Throat:     Mouth: Mucous membranes are moist.  Eyes:     Extraocular Movements: Extraocular movements intact.     Pupils: Pupils are equal, round, and reactive to light.  Cardiovascular:     Rate and Rhythm: Normal rate and regular rhythm.     Pulses: Normal pulses.     Heart sounds: Normal heart sounds.  Pulmonary:     Effort: Pulmonary effort is normal.     Breath sounds: Normal breath sounds.  Abdominal:     General: Bowel sounds are normal.     Palpations: Abdomen is soft.     Tenderness: There is no abdominal tenderness.  Musculoskeletal:        General: No swelling.     Right lower leg: No edema.     Left lower leg: No edema.  Lymphadenopathy:     Cervical: No cervical adenopathy.  Skin:    General: Skin is warm and dry.  Neurological:     General: No focal deficit present.     Mental Status: She is alert and oriented to person, place, and time.  Psychiatric:        Mood and Affect: Mood normal.        Behavior: Behavior normal.      LABORATORY DATA:  I have reviewed the labs as listed.  CBC    Component Value Date/Time   WBC 13.0 (H) 10/27/2021 1009   RBC 4.96 10/27/2021 1009   HGB 12.4 10/27/2021 1009   HCT 40.1 10/27/2021 1009   PLT 413 (H) 10/27/2021 1009   MCV 80.8 10/27/2021 1009   MCH 25.0 (L) 10/27/2021 1009   MCHC 30.9 10/27/2021 1009   RDW 14.6 10/27/2021 1009   LYMPHSABS 3.3 10/27/2021 1009   MONOABS 0.6 10/27/2021 1009   EOSABS 0.3 10/27/2021 1009   BASOSABS 0.1 10/27/2021 1009      Latest Ref Rng & Units 06/01/2020    6:37 PM 10/16/2018   10:24 AM 11/06/2015    7:52 PM  CMP  Glucose 70 - 99 mg/dL 157  230  165   BUN 6 - 20 mg/dL 15  11  6    Creatinine 0.44 - 1.00 mg/dL 0.46  0.45  0.61   Sodium 135 - 145 mmol/L 133  136  138   Potassium 3.5 - 5.1 mmol/L 4.7  4.5  3.4    Chloride 98 - 111 mmol/L 101  103  105   CO2 22 - 32 mmol/L 20  23  23    Calcium 8.9 - 10.3 mg/dL 9.4  9.1  9.4   Total Protein 6.5 - 8.1 g/dL 7.6  7.1    Total Bilirubin 0.3 - 1.2 mg/dL 0.5  0.7    Alkaline Phos 38 - 126 U/L 68  49    AST 15 - 41 U/L 41  20    ALT 0 - 44 U/L 41  24      DIAGNOSTIC IMAGING:  I have independently reviewed the relevant imaging and discussed with the patient.  ASSESSMENT & PLAN: 1.  Leukocytosis and thrombocytosis: - Nonspecific leukocytosis with intermittently elevated lymphocytes and neutrophils at least since July 2017. - MPN work-up was negative.  (Negative BCR/ABL FISH.  Negative JAK2/CALR/MPL.  Flow cytometry negative for monoclonal B-cell population or abnormal T-cell phenotype.)  LDH normal.  Inflammatory markers were elevated (CRP 5.7, ESR 28).  Negative ANA and RF. - She is not on any steroids.  No history of splenectomy.  No known connective tissue disease.  Non-smoker. - Denies any fevers.  She has occasional sweats, averaging once per week, waist down, for the past 2 months, during daytime when she sleeps.  She usually works at night.  She has not had any abnormal weight loss.   - She denies any recurrent infections.     - Most recent labs (10/27/2021): WBC 13.0/ANC 8.7, platelets 413 - PLAN: No concern for malignant leukocytosis at this time.  Thrombocytosis resolved after iron supplementation. - Suspect reactive leukocytosis in the setting of morbid obesity and chronic inflammation. - No further work-up planned at this time, but would consider bone marrow biopsy if any significant deviation from baseline.  2.  Iron deficiency with microcytosis, without anemia: - CBC (12/11/2020) shows normal Hgb 13.0, but microcytosis with MCV 78.6, and elevated RBC 5.37. - Iron panel (12/11/2020) shows iron deficiency with ferritin 17, iron saturation 6%, and TIBC 608.  Folate and B12 were normal. - She has moderately heavy periods that last 2 to 3 days, but  these have become more irregular and lighter after starting lo-Loestrin.  She was also given blood 4-5 times per year, but has not given blood since October 2022  - Started ferrous sulfate daily in November 2022, tolerating it well - Most recent labs (10/27/2021): Hgb 12.4/MCV 80.8, ferritin 44, iron saturation 10% with TIBC 459 - PLAN: Although iron studies are not at goal, they have improved.  Patient is asymptomatic, therefore no indication for IV iron at this time. - Recommend continuing oral iron supplementation with ferrous sulfate 325 mg daily, with Colace as needed for constipation - Repeat CBC and iron panel in 6 months.   - Patient instructed to stop donating blood until her iron deficiency has resolved.  3.  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  Per genetic counselor note: Approximately 48% lifetime risk of breast cancer.  Annual mammogram and breast MRI screening starting at age 8, or 37 years younger than earliest age of onset.  Risk reducing mastectomy could be considered. Approximately 10 to 14% lifetime risk of colon cancer.  Colonoscopy every 5 years starting at age 27. - PLAN: Patient provided with copy of genetic counselors report and instructed to follow these recommendations via her PCP.   4.  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  She is a non-smoker. - Mother had breast cancer.  Maternal great grandmother had cancer.      PLAN SUMMARY & DISPOSITION: Labs in 6 months  RTC after labs  All questions were answered. The patient knows to call the clinic with any problems, questions or concerns.  Medical decision making: Low  Time spent on visit: I spent 15 minutes counseling the patient face to face. The total time spent in the appointment was 22 minutes and more than 50% was on  counseling.   Harriett Rush, PA-C  11/01/2021 10:01 AM

## 2021-11-01 ENCOUNTER — Inpatient Hospital Stay (HOSPITAL_BASED_OUTPATIENT_CLINIC_OR_DEPARTMENT_OTHER): Payer: 59 | Admitting: Physician Assistant

## 2021-11-01 VITALS — BP 126/85 | HR 89 | Temp 97.6°F | Resp 18 | Ht 65.0 in | Wt 253.0 lb

## 2021-11-01 DIAGNOSIS — Z7982 Long term (current) use of aspirin: Secondary | ICD-10-CM | POA: Diagnosis not present

## 2021-11-01 DIAGNOSIS — E119 Type 2 diabetes mellitus without complications: Secondary | ICD-10-CM | POA: Diagnosis not present

## 2021-11-01 DIAGNOSIS — D75839 Thrombocytosis, unspecified: Secondary | ICD-10-CM | POA: Diagnosis not present

## 2021-11-01 DIAGNOSIS — E611 Iron deficiency: Secondary | ICD-10-CM | POA: Diagnosis not present

## 2021-11-01 DIAGNOSIS — Z7984 Long term (current) use of oral hypoglycemic drugs: Secondary | ICD-10-CM | POA: Diagnosis not present

## 2021-11-01 DIAGNOSIS — G4730 Sleep apnea, unspecified: Secondary | ICD-10-CM | POA: Diagnosis not present

## 2021-11-01 DIAGNOSIS — F419 Anxiety disorder, unspecified: Secondary | ICD-10-CM | POA: Diagnosis not present

## 2021-11-01 DIAGNOSIS — Z793 Long term (current) use of hormonal contraceptives: Secondary | ICD-10-CM | POA: Diagnosis not present

## 2021-11-01 DIAGNOSIS — E039 Hypothyroidism, unspecified: Secondary | ICD-10-CM | POA: Diagnosis not present

## 2021-11-01 DIAGNOSIS — D72829 Elevated white blood cell count, unspecified: Secondary | ICD-10-CM

## 2021-11-01 NOTE — Patient Instructions (Signed)
[place] at [facility] Discharge Instructions  You were seen today by Tarri Abernethy PA-C for your blood abnormalities, as discussed below.  ELEVATED WHITE BLOOD CELLS & PLATELETS: We checked several tests for your elevated white blood cells and platelets, which showed that you do not have any type of blood cancer.  Your inflammatory markers were high, which makes [place] suspect that your white blood cells and platelets are elevated due to underlying inflammation in your body.  This may be due to obesity, or other inflammatory causes.  IRON DEFICIENCY: Your iron levels are still low, but are improved from your previous levels.  Continue to take your over-the-counter iron supplement (ferrous sulfate 325 mg) once daily.    LABS: Return in 6 months for repeat labs  OTHER TESTS: None at this time  MEDICATIONS: Iron supplementation as above  FOLLOW-UP APPOINTMENT: Office visit in 6 months, after labs  ** Thank you for trusting me with your healthcare!  I strive to provide all of my patients with quality care at each visit.  If you receive a survey for this visit, I would be so grateful to you for taking the time to provide feedback.  Thank you in advance!  ~ Cydney Alvarenga                   Dr. Derek Jack   &   Tarri Abernethy, PA-C   - - - - - - - - - - - - - - - - - -     Thank you for choosing [place] at [facility] to provide your oncology and hematology care.  To afford each patient quality time with our provider, please arrive at least 15 minutes before your scheduled appointment time.   If you have a lab appointment with the [facility] please come in thru the Main Entrance and check in at the main information desk.  You need to re-schedule your appointment should you arrive 10 or more minutes late.  We strive to give you quality time with our providers, and arriving late affects you and other patients whose appointments are  after yours.  Also, if you no show three or more times for appointments you may be dismissed from the clinic at the providers discretion.     Again, thank you for choosing [facility].  Our hope is that these requests will decrease the amount of time that you wait before being seen by our physicians.       _____________________________________________________________  Should you have questions after your visit to [facility], please contact our office at [phone] and follow the prompts.  Our office hours are 8:00 a.m. and 4:30 p.m. Monday - Friday.  Please note that voicemails left after 4:00 p.m. may not be returned until the following business day.  We are closed weekends and major holidays.  You do have access to a nurse 24-7, just call the main number to the clinic [phone] and do not press any options, hold on the line and a nurse will answer the phone.    For prescription refill requests, have your pharmacy contact our office and allow 72 hours.    Due to Covid, you will need to wear a mask upon entering the hospital. If you do not have a mask, a mask will be given to you at the Main Entrance upon arrival. For doctor visits, patients may have 1 support person  age 22 or older with them. For treatment visits, patients can not have anyone with them due to social distancing guidelines and our immunocompromised population.

## 2021-11-10 DIAGNOSIS — F4323 Adjustment disorder with mixed anxiety and depressed mood: Secondary | ICD-10-CM | POA: Diagnosis not present

## 2021-11-12 DIAGNOSIS — Z760 Encounter for issue of repeat prescription: Secondary | ICD-10-CM | POA: Diagnosis not present

## 2021-11-14 ENCOUNTER — Other Ambulatory Visit (HOSPITAL_COMMUNITY): Payer: Self-pay

## 2021-11-15 ENCOUNTER — Other Ambulatory Visit (HOSPITAL_COMMUNITY): Payer: Self-pay

## 2021-11-15 MED ORDER — SYNJARDY XR 25-1000 MG PO TB24
1.0000 | ORAL_TABLET | Freq: Every day | ORAL | 3 refills | Status: DC
  Filled 2021-11-15: qty 90, 90d supply, fill #0
  Filled 2022-02-15: qty 90, 90d supply, fill #1
  Filled 2022-05-16: qty 90, 90d supply, fill #2
  Filled 2022-07-15 – 2022-08-02 (×3): qty 90, 90d supply, fill #3

## 2021-11-15 MED ORDER — ESCITALOPRAM OXALATE 20 MG PO TABS
20.0000 mg | ORAL_TABLET | Freq: Every day | ORAL | 3 refills | Status: DC
  Filled 2021-11-15: qty 90, 90d supply, fill #0
  Filled 2022-02-15: qty 90, 90d supply, fill #1
  Filled 2022-05-16: qty 90, 90d supply, fill #2
  Filled 2022-07-15 – 2022-08-02 (×3): qty 90, 90d supply, fill #3

## 2021-11-15 MED ORDER — LEVOTHYROXINE SODIUM 100 MCG PO TABS
100.0000 ug | ORAL_TABLET | Freq: Every morning | ORAL | 3 refills | Status: DC
  Filled 2021-11-15: qty 90, 90d supply, fill #0
  Filled 2022-02-15: qty 90, 90d supply, fill #1
  Filled 2022-05-16: qty 90, 90d supply, fill #2
  Filled 2022-07-15 – 2022-08-02 (×3): qty 90, 90d supply, fill #3

## 2021-11-18 ENCOUNTER — Other Ambulatory Visit (HOSPITAL_COMMUNITY): Payer: Self-pay

## 2021-11-19 DIAGNOSIS — H52203 Unspecified astigmatism, bilateral: Secondary | ICD-10-CM | POA: Diagnosis not present

## 2021-11-19 DIAGNOSIS — H5213 Myopia, bilateral: Secondary | ICD-10-CM | POA: Diagnosis not present

## 2021-12-01 DIAGNOSIS — G4733 Obstructive sleep apnea (adult) (pediatric): Secondary | ICD-10-CM | POA: Diagnosis not present

## 2021-12-01 DIAGNOSIS — F4323 Adjustment disorder with mixed anxiety and depressed mood: Secondary | ICD-10-CM | POA: Diagnosis not present

## 2021-12-13 ENCOUNTER — Other Ambulatory Visit (HOSPITAL_COMMUNITY): Payer: Self-pay

## 2021-12-16 DIAGNOSIS — F4323 Adjustment disorder with mixed anxiety and depressed mood: Secondary | ICD-10-CM | POA: Diagnosis not present

## 2021-12-20 DIAGNOSIS — E039 Hypothyroidism, unspecified: Secondary | ICD-10-CM | POA: Diagnosis not present

## 2021-12-20 DIAGNOSIS — E538 Deficiency of other specified B group vitamins: Secondary | ICD-10-CM | POA: Diagnosis not present

## 2021-12-20 DIAGNOSIS — E1169 Type 2 diabetes mellitus with other specified complication: Secondary | ICD-10-CM | POA: Diagnosis not present

## 2021-12-20 DIAGNOSIS — R7989 Other specified abnormal findings of blood chemistry: Secondary | ICD-10-CM | POA: Diagnosis not present

## 2021-12-27 ENCOUNTER — Other Ambulatory Visit (HOSPITAL_COMMUNITY): Payer: Self-pay

## 2021-12-27 DIAGNOSIS — J3501 Chronic tonsillitis: Secondary | ICD-10-CM | POA: Diagnosis not present

## 2021-12-27 DIAGNOSIS — K9089 Other intestinal malabsorption: Secondary | ICD-10-CM | POA: Diagnosis not present

## 2021-12-27 DIAGNOSIS — Z1339 Encounter for screening examination for other mental health and behavioral disorders: Secondary | ICD-10-CM | POA: Diagnosis not present

## 2021-12-27 DIAGNOSIS — Z1331 Encounter for screening for depression: Secondary | ICD-10-CM | POA: Diagnosis not present

## 2021-12-27 DIAGNOSIS — G4733 Obstructive sleep apnea (adult) (pediatric): Secondary | ICD-10-CM | POA: Diagnosis not present

## 2021-12-27 DIAGNOSIS — E538 Deficiency of other specified B group vitamins: Secondary | ICD-10-CM | POA: Diagnosis not present

## 2021-12-27 DIAGNOSIS — E039 Hypothyroidism, unspecified: Secondary | ICD-10-CM | POA: Diagnosis not present

## 2021-12-27 DIAGNOSIS — F331 Major depressive disorder, recurrent, moderate: Secondary | ICD-10-CM | POA: Diagnosis not present

## 2021-12-27 DIAGNOSIS — E1169 Type 2 diabetes mellitus with other specified complication: Secondary | ICD-10-CM | POA: Diagnosis not present

## 2021-12-27 DIAGNOSIS — Z0000 Encounter for general adult medical examination without abnormal findings: Secondary | ICD-10-CM | POA: Diagnosis not present

## 2021-12-27 DIAGNOSIS — D72829 Elevated white blood cell count, unspecified: Secondary | ICD-10-CM | POA: Diagnosis not present

## 2021-12-27 MED ORDER — CHOLESTYRAMINE 4 G PO PACK
PACK | ORAL | 1 refills | Status: DC
  Filled 2021-12-27: qty 90, 30d supply, fill #0
  Filled 2022-02-15: qty 90, 30d supply, fill #1

## 2021-12-28 ENCOUNTER — Other Ambulatory Visit (HOSPITAL_COMMUNITY): Payer: Self-pay

## 2021-12-29 ENCOUNTER — Other Ambulatory Visit (HOSPITAL_COMMUNITY): Payer: Self-pay

## 2022-01-13 DIAGNOSIS — F4323 Adjustment disorder with mixed anxiety and depressed mood: Secondary | ICD-10-CM | POA: Diagnosis not present

## 2022-02-08 DIAGNOSIS — F4323 Adjustment disorder with mixed anxiety and depressed mood: Secondary | ICD-10-CM | POA: Diagnosis not present

## 2022-02-15 ENCOUNTER — Other Ambulatory Visit (HOSPITAL_COMMUNITY): Payer: Self-pay

## 2022-02-15 ENCOUNTER — Other Ambulatory Visit: Payer: Self-pay

## 2022-02-16 ENCOUNTER — Other Ambulatory Visit: Payer: Self-pay

## 2022-02-16 ENCOUNTER — Other Ambulatory Visit (HOSPITAL_COMMUNITY): Payer: Self-pay

## 2022-02-16 DIAGNOSIS — F4323 Adjustment disorder with mixed anxiety and depressed mood: Secondary | ICD-10-CM | POA: Diagnosis not present

## 2022-02-17 ENCOUNTER — Other Ambulatory Visit (HOSPITAL_COMMUNITY): Payer: Self-pay

## 2022-03-03 DIAGNOSIS — G4733 Obstructive sleep apnea (adult) (pediatric): Secondary | ICD-10-CM | POA: Diagnosis not present

## 2022-03-08 DIAGNOSIS — F4323 Adjustment disorder with mixed anxiety and depressed mood: Secondary | ICD-10-CM | POA: Diagnosis not present

## 2022-03-10 ENCOUNTER — Ambulatory Visit (INDEPENDENT_AMBULATORY_CARE_PROVIDER_SITE_OTHER): Payer: 59 | Admitting: Adult Health

## 2022-03-10 ENCOUNTER — Encounter: Payer: Self-pay | Admitting: Adult Health

## 2022-03-10 VITALS — BP 124/75 | HR 91 | Ht 65.0 in | Wt 265.8 lb

## 2022-03-10 DIAGNOSIS — G4733 Obstructive sleep apnea (adult) (pediatric): Secondary | ICD-10-CM

## 2022-03-10 NOTE — Progress Notes (Signed)
PATIENT: Karen Hoover DOB: May 08, 1990  REASON FOR VISIT: follow up HISTORY FROM: patient  Chief Complaint  Patient presents with   Follow-up    Pt in  4 Pt here for CPAP f/u  Pt states not using CPAP everynight Pt states she is so tired at night she forgets to use CPAP      HISTORY OF PRESENT ILLNESS: Today 03/10/22:  Karen Hoover is a 32 y.o. female with a history of OSA on CPAP. Returns today for follow-up.  She reports that she has not been using the CPAP consistently she states that there are some nights she gets in bed and falls asleep before putting it on.  She also works third shift.  Her download indicates that she used her machine 18 out of the last 30 days for compliance of 60%.  She used her machine greater than 4 hours for compliance of 23.3%.  Her residual AHI is 2.4 on 9 cm of water.  Her average usage is 3 hours and 38 minutes.     03/10/21: Karen Hoover is a 32 year old female with a history of obstructive sleep apnea on CPAP.  She returns today for follow-up.  Her download indicates that she used the machine 20 out of the last 30 days for compliance of 66%.  On average she uses the machine about 2 hours and 31 minutes.  Her residual AHI is 1.4 on 9 cm of water.  She states that she has been having hard time using the CPAP due to a sore throat and what she feels like it swollen tonsils.  She has been evaluated by her PCP.  Strep, COVID and influenza has been negative.  She did see ENT but during that visit was not having an exacerbation of pain.  The patient reports that she often has to take Tylenol to reduce her pain.  For this reason she has not been able to use her CPAP as often.    REVIEW OF SYSTEMS: Out of a complete 14 system review of symptoms, the patient complains only of the following symptoms, and all other reviewed systems are negative.  ESS 14 FSS 36   ALLERGIES: Allergies  Allergen Reactions   Metformin Hcl Er Other (See Comments)    diarrhea     HOME MEDICATIONS: Outpatient Medications Prior to Visit  Medication Sig Dispense Refill   albuterol (VENTOLIN HFA) 108 (90 Base) MCG/ACT inhaler Inhale 2 puffs into the lungs every 8 (eight) hours as needed for wheezing or shortness of breath.     aspirin EC 81 MG tablet Take 81 mg by mouth daily.     benzonatate (TESSALON) 100 MG capsule Take 1 capsule (100 mg total) by mouth 2 (two) times daily as needed for cough. (Patient taking differently: Take 100 mg by mouth as needed.) 15 capsule 0   Blood Glucose Monitoring Suppl (FREESTYLE LITE) w/Device KIT Use as directed to test 4 times daily 1 kit 0   cefdinir (OMNICEF) 300 MG capsule Take 1 capsule (300 mg total) by mouth 2 (two) times daily with breakfast and supper as directed 14 capsule 0   cetirizine (ZYRTEC ALLERGY) 10 MG tablet Take 1 tablet (10 mg total) by mouth daily. (Patient taking differently: Take 10 mg by mouth.) 30 tablet 0   cholestyramine (QUESTRAN) 4 g packet Take 1 packet mixed with water or non-carbonated drink  three times per day (Patient taking differently: 2 (two) times daily.) 90 packet 1   Cyanocobalamin (VITAMIN B-12)  [place] 1 tablet under the tongue.     Dulaglutide (TRULICITY) 1.5 YC/1.4GY SOPN Inject 1.5 mg into the skin once a week. 2 mL 6   Empagliflozin-metFORMIN HCl ER (SYNJARDY XR) 25-1000 MG TB24 Take 1 tablet by mouth daily with breakfast. 30 tablet 6   escitalopram (LEXAPRO) 20 MG tablet Take 1 tablet (20 mg total) by mouth daily. 90 tablet 3   Esketamine HCl, 56 MG Dose, (SPRAVATO, 56 MG DOSE,) 28 MG/DEVICE SOPK mornings     Ferrous Sulfate (IRON PO) Take 1 tablet by mouth daily.     fluticasone (FLONASE) 50 MCG/ACT nasal spray Place 2 sprays into both nostrils daily. 16 g 2   gabapentin (NEURONTIN) 300 MG capsule Take 300 mg by mouth daily as needed (pain).     glucose blood (FREESTYLE LITE) test strip Use to test blood sugar 4 times daily 100 strip 5   glucose blood test strip Use to test  2 times daily. 100 each 3   guaiFENesin-codeine (ROBITUSSIN AC) 100-10 MG/5ML syrup Take 5 mLs by mouth 3 (three) times daily as needed for cough. (Patient taking differently: Take 5 mLs by mouth as needed for cough.) 120 mL 0   Ibuprofen 200 MG CAPS Take 800 mg by mouth every 8 (eight) hours as needed for moderate pain.      Lancets (FREESTYLE) lancets Use to test blood sugar 4 times daily 400 each 5   levothyroxine (SYNTHROID) 100 MCG tablet Take 1 tablet (100 mcg total) by mouth in the morning on an empty stomach 90 tablet 3   methocarbamol (ROBAXIN) 500 MG tablet Take 1 tablet (500 mg total) by mouth every 8 (eight) hours. 90 tablet 0   Multiple Vitamin (MULTIVITAMIN WITH MINERALS) TABS tablet Take 1 tablet by mouth daily. OTC     norethindrone-ethinyl estradiol (LOESTRIN) 1-20 MG-MCG tablet Take 1 daily continuously, no inert pills 84 tablet 4   OneTouch Delica Lancets 18H MISC Use to self monitor blood glucose twice daily (DX: ICD10: R73.01)     Zinc Sulfate (ZINC 15 PO) Take by mouth. OTC     Empagliflozin-metFORMIN HCl ER (SYNJARDY XR) 25-1000 MG TB24 Take 1 tablet by mouth daily with breakfast. 90 tablet 3   escitalopram (LEXAPRO) 20 MG tablet TAKE 1 TABLET BY MOUTH ONCE DAILY 30 tablet 5   levothyroxine (SYNTHROID) 75 MCG tablet TAKE 1 TABLET BY MOUTH DAILY EVERY MORNING 90 tablet 3   naproxen (NAPROSYN) 500 MG tablet Take 1 tablet (500 mg total) by mouth 2 (two) times daily with a meal. 30 tablet 0   sertraline (ZOLOFT) 25 MG tablet TAKE 1 TABLET BY MOUTH ONCE DAILY. 90 tablet 1   No facility-administered medications prior to visit.    PAST MEDICAL HISTORY: Past Medical History:  Diagnosis Date   Anemia    Anxiety 03/17/2015   BV (bacterial vaginosis) 04/10/2014   Chronic lower back pain    2 herniated disc sees Dr Carloyn Manner   Complication of anesthesia    O2 desaturation, Hypotension, Fluid filled lungs   Diabetes mellitus without complication ([place])    Encounter for menstrual  regulation 03/13/2014   Family history of breast cancer    Family history of melanoma    Fatty liver 09/05/2018   Gallstones 09/05/2018   Refer to Dr Constance Haw   Hemorrhoids 01/12/2015   History of rectal bleeding 01/12/2015   Hypothyroidism    IC (interstitial cystitis) 01/22/2015   Pulmonary edema 08/21/2015   S/P back  OR   Sleep apnea    Thyroid disease    Tonsillitis    Vaginal discharge 03/13/2014   Vaginal itching 01/12/2015   Vestibulitis, vulvar 04/21/2014   Yeast infection 03/13/2014    PAST SURGICAL HISTORY: Past Surgical History:  Procedure Laterality Date   BACK SURGERY     CHOLECYSTECTOMY N/A 10/17/2018   Procedure: LAPAROSCOPIC CHOLECYSTECTOMY;  Surgeon: Lucretia Roers, MD;  Location: [facility];  Service: General;  Laterality: N/A;   LAMINECTOMY AND MICRODISCECTOMY LUMBAR SPINE Right 08/21/2015   L4-5 and L5-S 1    FAMILY HISTORY: Family History  Problem Relation Age of Onset   Diabetes Mother    Thyroid disease Mother    Breast cancer Mother 59       CHEK2 pos   Thyroid disease Brother    Diabetes Maternal Grandmother    Melanoma Maternal Grandmother        d. 60   Melanoma Maternal Grandfather        d. 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: [employer]  Tobacco Use   Smoking status: Never   Smokeless tobacco: Never  Vaping Use   Vaping Use: Never used  Substance and Sexual Activity   Alcohol use: Yes    Comment: occasionally    Drug use: No   Sexual activity: Not Currently    Birth control/protection: Condom  Other  Topics Concern   Not on file  Social History Narrative   Not on file   Social Determinants of Health   Financial Resource Strain: Medium Risk (08/20/2021)   Overall Financial Resource Strain (CARDIA)    Difficulty of Paying Living Expenses: Somewhat hard  Food Insecurity: Food Insecurity Present (08/20/2021)   Hunger Vital Sign    Worried About Running Out of Food in the Last Year: Sometimes true    Ran Out of Food in the Last Year: Never true  Transportation Needs: No Transportation Needs (08/20/2021)   PRAPARE - Administrator, Civil Service (Medical): No    Lack of Transportation (Non-Medical): No  Physical Activity: Sufficiently Active (08/20/2021)   Exercise Vital Sign    Days of Exercise per Week: 7 days    Minutes of Exercise per Session: 30 min  Stress: Stress Concern Present (08/20/2021)   [employer] of Occupational Health - Occupational Stress Questionnaire    Feeling of Stress : To some extent  Social Connections: Moderately Integrated (08/20/2021)   Social Connection and Isolation Panel [NHANES]    Frequency of Communication with Friends and Family: More than three times a week    Frequency of Social Gatherings with Friends and Family: Once a week    Attends Religious Services: More than 4 times per year    Active Member of [employer] or Organizations: Yes    Attends Engineer, structural: More than 4 times per year    Marital Status: Never married  Intimate Partner Violence: Not At Risk (08/20/2021)   Humiliation, Afraid, Rape, and Kick questionnaire    Fear of Current or Ex-Partner: No    Emotionally Abused: No    Physically Abused: No    Sexually Abused: No      PHYSICAL EXAM  Vitals:   03/10/22 1417  BP: 124/75  Pulse: 91  Weight: 265 lb 12.8 oz (120.6 kg)  Height: 5\' 5"  (1.651 m)   Body mass index is 44.23 kg/m.  Generalized: Well developed, in no acute distress  Chest: Lungs clear to auscultation bilaterally  Neurological  examination  Mentation: Alert oriented to time, place, history taking. Follows all commands speech and language fluent Cranial nerve II-XII: Extraocular movements were full, visual field were full on confrontational test Head turning and shoulder shrug  were normal and symmetric. Motor: The motor testing reveals 5 over 5 strength of all 4 extremities. Good symmetric motor tone is noted throughout.  Sensory: Sensory testing is intact to soft touch on all 4 extremities. No evidence of extinction is noted.  Gait and station: Gait is normal.    DIAGNOSTIC DATA (LABS, IMAGING, TESTING) - I reviewed patient records, labs, notes, testing and imaging myself where available.  Lab Results  Component Value Date   WBC 13.0 (H) 10/27/2021   HGB 12.4 10/27/2021   HCT 40.1 10/27/2021   MCV 80.8 10/27/2021   PLT 413 (H) 10/27/2021      Component Value Date/Time   NA 133 (L) 06/01/2020 1837   NA 139 04/10/2014 1021   K 4.7 06/01/2020 1837   CL 101 06/01/2020 1837   CO2 20 (L) 06/01/2020 1837   GLUCOSE 157 (H) 06/01/2020 1837   BUN 15 06/01/2020 1837   BUN 9 04/10/2014 1021   CREATININE 0.46 06/01/2020 1837   CALCIUM 9.4 06/01/2020 1837   PROT 7.6 06/01/2020 1837   PROT 6.5 04/10/2014 1021   ALBUMIN 3.7 06/01/2020 1837   ALBUMIN 4.1 04/10/2014 1021   AST 41 06/01/2020 1837   ALT 41 06/01/2020 1837   ALKPHOS 68 06/01/2020 1837   BILITOT 0.5 06/01/2020 1837   BILITOT 0.5 04/10/2014 1021   GFRNONAA >60 06/01/2020 1837   GFRAA >60 10/16/2018 1024   No results found for: "CHOL", "HDL", "LDLCALC", "LDLDIRECT", "TRIG", "CHOLHDL" Lab Results  Component Value Date   HGBA1C 6.8 (H) 10/16/2018   Lab Results  Component Value Date   VITAMINB12 508 12/11/2020   No results found for: "TSH"    ASSESSMENT AND PLAN 32 y.o. year old female  has a past medical history of Anemia, Anxiety (03/17/2015), BV (bacterial vaginosis) (04/10/2014), Chronic lower back pain, Complication of anesthesia,  Diabetes mellitus without complication ([place]), Encounter for menstrual regulation (03/13/2014), Family history of breast cancer, Family history of melanoma, Fatty liver (09/05/2018), Gallstones (09/05/2018), Hemorrhoids (01/12/2015), History of rectal bleeding (01/12/2015), Hypothyroidism, IC (interstitial cystitis) (01/22/2015), Pulmonary edema (08/21/2015), Sleep apnea, Thyroid disease, Tonsillitis, Vaginal discharge (03/13/2014), Vaginal itching (01/12/2015), Vestibulitis, vulvar (04/21/2014), and Yeast infection (03/13/2014). here with:  OSA on CPAP  - CPAP compliance suboptimal - Good treatment of AHI when using the machine - Encourage patient to use CPAP nightly and > 4 hours each night -Encouraged the patient to try to put the CPAP on before she gets in bed in order to avoid falling asleep before put the mask on -Follow-up in 1 year or sooner if needed     Ward Givens, MSN, NP-C 03/10/2022, 2:43 PM [facility] [address], [address]  [place], [place] [address] [phone]

## 2022-03-10 NOTE — Patient Instructions (Signed)
Continue using CPAP nightly and greater than 4 hours each night °If your symptoms worsen or you develop new symptoms please let us know.  ° °

## 2022-03-21 DIAGNOSIS — F4323 Adjustment disorder with mixed anxiety and depressed mood: Secondary | ICD-10-CM | POA: Diagnosis not present

## 2022-03-23 ENCOUNTER — Other Ambulatory Visit (HOSPITAL_COMMUNITY): Payer: Self-pay

## 2022-03-23 MED ORDER — TRULICITY 1.5 MG/0.5ML ~~LOC~~ SOAJ
1.5000 mg | SUBCUTANEOUS | 6 refills | Status: DC
  Filled 2022-03-23: qty 2, 28d supply, fill #0
  Filled 2022-04-25: qty 2, 28d supply, fill #1
  Filled 2022-05-31: qty 2, 28d supply, fill #2
  Filled 2022-07-15: qty 2, 28d supply, fill #3
  Filled 2022-08-02 – 2022-08-30 (×2): qty 2, 28d supply, fill #4
  Filled 2022-09-21: qty 2, 28d supply, fill #5
  Filled 2022-11-07: qty 2, 28d supply, fill #6

## 2022-03-28 ENCOUNTER — Other Ambulatory Visit (HOSPITAL_COMMUNITY): Payer: Self-pay

## 2022-04-03 DIAGNOSIS — G4733 Obstructive sleep apnea (adult) (pediatric): Secondary | ICD-10-CM | POA: Diagnosis not present

## 2022-04-25 ENCOUNTER — Other Ambulatory Visit (HOSPITAL_COMMUNITY): Payer: Self-pay

## 2022-04-25 MED ORDER — CHOLESTYRAMINE 4 G PO PACK
4.0000 g | PACK | ORAL | 1 refills | Status: DC
  Filled 2022-04-25: qty 90, 30d supply, fill #0
  Filled 2022-06-01: qty 90, 30d supply, fill #1

## 2022-04-26 ENCOUNTER — Other Ambulatory Visit (HOSPITAL_COMMUNITY): Payer: Self-pay

## 2022-04-27 ENCOUNTER — Other Ambulatory Visit (HOSPITAL_COMMUNITY): Payer: Self-pay

## 2022-04-29 ENCOUNTER — Other Ambulatory Visit: Payer: 59

## 2022-04-29 ENCOUNTER — Other Ambulatory Visit: Payer: Self-pay

## 2022-04-29 ENCOUNTER — Inpatient Hospital Stay: Payer: 59 | Attending: Hematology

## 2022-05-02 DIAGNOSIS — G4733 Obstructive sleep apnea (adult) (pediatric): Secondary | ICD-10-CM | POA: Diagnosis not present

## 2022-05-06 ENCOUNTER — Ambulatory Visit: Payer: 59 | Admitting: Physician Assistant

## 2022-05-16 ENCOUNTER — Other Ambulatory Visit: Payer: Self-pay

## 2022-05-16 ENCOUNTER — Other Ambulatory Visit (HOSPITAL_COMMUNITY): Payer: Self-pay

## 2022-05-26 ENCOUNTER — Inpatient Hospital Stay: Payer: 59 | Attending: Hematology

## 2022-05-26 DIAGNOSIS — D72829 Elevated white blood cell count, unspecified: Secondary | ICD-10-CM | POA: Insufficient documentation

## 2022-05-26 DIAGNOSIS — D75839 Thrombocytosis, unspecified: Secondary | ICD-10-CM | POA: Insufficient documentation

## 2022-05-26 DIAGNOSIS — Z1509 Genetic susceptibility to other malignant neoplasm: Secondary | ICD-10-CM | POA: Diagnosis not present

## 2022-05-26 DIAGNOSIS — Z1501 Genetic susceptibility to malignant neoplasm of breast: Secondary | ICD-10-CM | POA: Diagnosis not present

## 2022-05-26 DIAGNOSIS — Z803 Family history of malignant neoplasm of breast: Secondary | ICD-10-CM | POA: Diagnosis not present

## 2022-05-26 DIAGNOSIS — E611 Iron deficiency: Secondary | ICD-10-CM | POA: Diagnosis not present

## 2022-05-26 LAB — CBC WITH DIFFERENTIAL/PLATELET
Abs Immature Granulocytes: 0.07 10*3/uL (ref 0.00–0.07)
Basophils Absolute: 0.1 10*3/uL (ref 0.0–0.1)
Basophils Relative: 1 %
Eosinophils Absolute: 0.4 10*3/uL (ref 0.0–0.5)
Eosinophils Relative: 3 %
HCT: 42.3 % (ref 36.0–46.0)
Hemoglobin: 13.2 g/dL (ref 12.0–15.0)
Immature Granulocytes: 1 %
Lymphocytes Relative: 29 %
Lymphs Abs: 3.7 10*3/uL (ref 0.7–4.0)
MCH: 25.9 pg — ABNORMAL LOW (ref 26.0–34.0)
MCHC: 31.2 g/dL (ref 30.0–36.0)
MCV: 82.9 fL (ref 80.0–100.0)
Monocytes Absolute: 0.7 10*3/uL (ref 0.1–1.0)
Monocytes Relative: 5 %
Neutro Abs: 7.9 10*3/uL — ABNORMAL HIGH (ref 1.7–7.7)
Neutrophils Relative %: 61 %
Platelets: 410 10*3/uL — ABNORMAL HIGH (ref 150–400)
RBC: 5.1 MIL/uL (ref 3.87–5.11)
RDW: 14.7 % (ref 11.5–15.5)
WBC: 12.7 10*3/uL — ABNORMAL HIGH (ref 4.0–10.5)
nRBC: 0 % (ref 0.0–0.2)

## 2022-05-26 LAB — FERRITIN: Ferritin: 63 ng/mL (ref 11–307)

## 2022-05-26 LAB — IRON AND TIBC
Iron: 42 ug/dL (ref 28–170)
Saturation Ratios: 9 % — ABNORMAL LOW (ref 10.4–31.8)
TIBC: 476 ug/dL — ABNORMAL HIGH (ref 250–450)
UIBC: 434 ug/dL

## 2022-05-27 ENCOUNTER — Other Ambulatory Visit: Payer: Self-pay

## 2022-06-01 DIAGNOSIS — G4733 Obstructive sleep apnea (adult) (pediatric): Secondary | ICD-10-CM | POA: Diagnosis not present

## 2022-06-02 ENCOUNTER — Other Ambulatory Visit (HOSPITAL_COMMUNITY): Payer: Self-pay

## 2022-06-02 DIAGNOSIS — R5383 Other fatigue: Secondary | ICD-10-CM | POA: Diagnosis not present

## 2022-06-02 DIAGNOSIS — J069 Acute upper respiratory infection, unspecified: Secondary | ICD-10-CM | POA: Diagnosis not present

## 2022-06-02 DIAGNOSIS — R051 Acute cough: Secondary | ICD-10-CM | POA: Diagnosis not present

## 2022-06-02 DIAGNOSIS — G43909 Migraine, unspecified, not intractable, without status migrainosus: Secondary | ICD-10-CM | POA: Diagnosis not present

## 2022-06-02 DIAGNOSIS — Z1152 Encounter for screening for COVID-19: Secondary | ICD-10-CM | POA: Diagnosis not present

## 2022-06-02 DIAGNOSIS — J029 Acute pharyngitis, unspecified: Secondary | ICD-10-CM | POA: Diagnosis not present

## 2022-06-02 DIAGNOSIS — R0981 Nasal congestion: Secondary | ICD-10-CM | POA: Diagnosis not present

## 2022-06-02 MED ORDER — AZITHROMYCIN 250 MG PO TABS
ORAL_TABLET | ORAL | 0 refills | Status: DC
  Filled 2022-06-02: qty 6, 5d supply, fill #0

## 2022-06-02 MED ORDER — ALBUTEROL SULFATE HFA 108 (90 BASE) MCG/ACT IN AERS
1.0000 | INHALATION_SPRAY | RESPIRATORY_TRACT | 0 refills | Status: AC
  Filled 2022-06-02: qty 6.7, 30d supply, fill #0

## 2022-06-03 NOTE — Progress Notes (Unsigned)
[facility] [address], [place] [address]   CLINIC:  Medical Oncology/Hematology  PCP:  Alysia Penna, MD [address] [place] [address] [phone]   REASON FOR VISIT:  Follow-up for leukocytosis, thrombocytosis, and iron deficiency   PRIOR THERAPY: None   CURRENT THERAPY: Ferrous sulfate 325 mg daily  INTERVAL HISTORY:   [name] 32 y.o. female returns for routine follow-up of leukocytosis, thrombocytosis, and iron deficiency.  She was last seen by Rojelio Brenner PA-C on 11/01/2021  At today's visit, she reports feeling fair.  No recent hospitalizations, surgeries, or changes in baseline health status.  She has been taking iron tablet daily without any issues.  She reports that her periods are regular with moderate bleeding - she is taking lo-loestrin to help decrease her bleeding.  She denies any other source of blood loss such as hematemesis, hematochezia, melena, or epistaxis.  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  She denies any pica, restless legs, headaches, chest pain, dyspnea exertion, lightheadedness, or syncope.  She reports recent Z-Pak for treatment of sinus infection, but denies any other antibiotics in the past year.  She denies any new masses or lymphadenopathy.  She denies any fever, chills, night sweats, unintentional weight loss.  (Her previously reported night sweats resolved after she adjusted thermostat to lower temperature.)  She has 50% energy and 75% appetite. She endorses that she is maintaining a stable weight.   ASSESSMENT & PLAN:  1.  Leukocytosis and thrombocytosis: - Nonspecific leukocytosis with intermittently elevated lymphocytes and neutrophils at least since July 2017. - MPN work-up was negative.  (Negative BCR/ABL FISH.  Negative JAK2/CALR/MPL.  Flow cytometry negative for monoclonal B-cell  population or abnormal T-cell phenotype.)  LDH normal.  Inflammatory markers were elevated (CRP 5.7, ESR 28).  Negative ANA and RF. - She is not on any steroids.  No history of splenectomy.  No known connective tissue disease.  Non-smoker. - Denies any fevers.  She has occasional sweats, averaging once per week, waist down, for the past 2 months, during daytime when she sleeps.  She usually works at night.  She has not had any abnormal weight loss. - She denies any recurrent infections.     - Most recent labs (05/26/2022): WBC 12.7/ANC 7.9, platelets 410 - PLAN: No concern for malignant leukocytosis or thrombocytosis at this time. - Suspect reactive thrombocytosis from inflammation, obesity, and iron deficiency. - Suspect reactive leukocytosis in the setting of morbid obesity and chronic inflammation. - No further work-up planned at this time, but would consider bone marrow biopsy if any significant deviation from baseline.  2.  Iron deficiency with microcytosis, without anemia: - CBC (12/11/2020) shows normal Hgb 13.0, but microcytosis with MCV 78.6, and elevated RBC 5.37. - Iron panel (12/11/2020) shows iron deficiency with ferritin 17, iron saturation 6%, and TIBC 608.  Folate and B12 were normal. - She has moderately heavy periods that last 2 to 3 days, but these have become more irregular and lighter after starting lo-Loestrin.  She was also given blood 4-5 times per year, but has not given blood since October 2022 - Started ferrous sulfate daily in November 2022, tolerating it well - Most recent labs (05/26/2022): Hgb 13.2/MCV 82.9, ferritin 63, iron saturation 9 % with TIBC 476 - PLAN: Recommend Venofer 400 mg x 1 due to symptomatic iron deficiency.  (We discussed potential side effects  and risk of reaction, and patient is agreeable to proceed.) - Recommend continuing oral iron supplementation with ferrous sulfate 325 mg daily, with Colace as needed for constipation - Repeat CBC and iron panel  in 6 months.   - Patient instructed to stop donating blood until her iron deficiency has resolved.   3.  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  Per genetic counselor note: Approximately 48% lifetime risk of breast cancer.  Annual mammogram and breast MRI screening starting at age 59, or 35 years younger than earliest age of onset.  Risk reducing mastectomy could be considered. Approximately 10 to 14% lifetime risk of colon cancer.  Colonoscopy every 5 years starting at age 28. - Patient is not sure when her mother was diagnosed with breast cancer, but thinks that it was "sometime in her 21s" - PLAN: Patient has been provided with copy of genetic counselors report and instructed to follow these recommendations via her PCP. - Patient encouraged to find out specific age of for members diagnosis of cancer, so that she can start screening protocol at appropriate time.  4.  Morbid obesity - Discussed with patient various strategies toward improving her health, particularly focusing on one change at a time to make steps toward healthier lifestyle overall.  Patient is optimistic and would like to make changes to her health.     5.  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  She is a non-smoker. - Mother had breast cancer.  Maternal great grandmother had cancer.   PLAN SUMMARY: >> Venofer 400 mg x 1 >> Labs in 6 months = CBC/D, ferritin, iron/TIBC >> OFFICE visit in 6 months (after labs)    [facility] **VISIT SUMMARY & IMPORTANT INSTRUCTIONS **   You were seen today by Rojelio Brenner PA-C for your iron deficiency anemia.   Your hemoglobin and red blood cells are normal, but your iron level is low. Your low iron level may be causing some fatigue. We will give you IV iron with  Venofer 400 mg x 1.  Please see the attached handout for important information regarding possible side effects. You should still continue to take iron pill once daily.  Your white blood cells and platelets remain mildly elevated but stable.  FOLLOW-UP APPOINTMENT: Office visit in 6 months, with labs the week before      REVIEW OF SYSTEMS:   Review of Systems  Constitutional:  Positive for fatigue. Negative for appetite change, chills, diaphoresis, fever and unexpected weight change.  HENT:   Negative for lump/mass and nosebleeds.   Eyes:  Negative for eye problems.  Respiratory:  Negative for cough, hemoptysis and shortness of breath.   Cardiovascular:  Negative for chest pain, leg swelling and palpitations.  Gastrointestinal:  Negative for abdominal pain, blood in stool, constipation, diarrhea, nausea and vomiting.  Genitourinary:  Negative for hematuria.   Skin: Negative.   Neurological:  Negative for dizziness, headaches and light-headedness.  Hematological:  Does not bruise/bleed easily.  Psychiatric/Behavioral:  Positive for depression and sleep disturbance. The patient is nervous/anxious.      PHYSICAL EXAM:  ECOG PERFORMANCE STATUS: 1 - Symptomatic but completely ambulatory  There were no vitals filed for this visit. There were no vitals filed for this visit. Physical Exam Constitutional:  Appearance: Normal appearance. She is morbidly obese.  Cardiovascular:     Heart sounds: Normal heart sounds.  Pulmonary:     Breath sounds: Normal breath sounds.  Neurological:     General: No focal deficit present.     Mental Status: Mental status is at baseline.  Psychiatric:        Behavior: Behavior normal. Behavior is cooperative.     PAST MEDICAL/SURGICAL HISTORY:  Past Medical History:  Diagnosis Date   Anemia    Anxiety 03/17/2015   BV (bacterial vaginosis) 04/10/2014   Chronic lower back pain    2 herniated disc sees Dr Channing Mutters   Complication of anesthesia    O2  desaturation, Hypotension, Fluid filled lungs   Diabetes mellitus without complication (HCC)    Encounter for menstrual regulation 03/13/2014   Family history of breast cancer    Family history of melanoma    Fatty liver 09/05/2018   Gallstones 09/05/2018   Refer to Dr Henreitta Leber   Hemorrhoids 01/12/2015   History of rectal bleeding 01/12/2015   Hypothyroidism    IC (interstitial cystitis) 01/22/2015   Pulmonary edema 08/21/2015   S/P back OR   Sleep apnea    Thyroid disease    Tonsillitis    Vaginal discharge 03/13/2014   Vaginal itching 01/12/2015   Vestibulitis, vulvar 04/21/2014   Yeast infection 03/13/2014   Past Surgical History:  Procedure Laterality Date   BACK SURGERY     CHOLECYSTECTOMY N/A 10/17/2018   Procedure: LAPAROSCOPIC CHOLECYSTECTOMY;  Surgeon: Lucretia Roers, MD;  Location: [facility];  Service: General;  Laterality: N/A;   LAMINECTOMY AND MICRODISCECTOMY LUMBAR SPINE Right 08/21/2015   L4-5 and L5-S 1    SOCIAL HISTORY:  Social History   Socioeconomic History   Marital status: Single    Spouse name: Not on file   Number of children: Not on file   Years of education: Not on file   Highest education level: Not on file  Occupational History   Occupation: Surgical Instrument cleaner    Employer: [employer]  Tobacco Use   Smoking status: Never   Smokeless tobacco: Never  Vaping Use   Vaping Use: Never used  Substance and Sexual Activity   Alcohol use: Yes    Comment: occasionally    Drug use: No   Sexual activity: Not Currently    Birth control/protection: Condom  Other Topics Concern   Not on file  Social History Narrative   Not on file   Social Determinants of Health   Financial Resource Strain: Medium Risk (08/20/2021)   Overall Financial Resource Strain (CARDIA)    Difficulty of Paying Living Expenses: Somewhat hard  Food Insecurity: Food Insecurity Present (08/20/2021)   Hunger Vital Sign    Worried About Running Out of Food in  the Last Year: Sometimes true    Ran Out of Food in the Last Year: Never true  Transportation Needs: No Transportation Needs (08/20/2021)   PRAPARE - Administrator, Civil Service (Medical): No    Lack of Transportation (Non-Medical): No  Physical Activity: Sufficiently Active (08/20/2021)   Exercise Vital Sign    Days of Exercise per Week: 7 days    Minutes of Exercise per Session: 30 min  Stress: Stress Concern Present (08/20/2021)   [employer] of Occupational Health - Occupational Stress Questionnaire    Feeling of Stress : To some extent  Social Connections: Moderately Integrated (08/20/2021)   Social Connection and Isolation Panel [  NHANES]    Frequency of Communication with Friends and Family: More than three times a week    Frequency of Social Gatherings with Friends and Family: Once a week    Attends Religious Services: More than 4 times per year    Active Member of [employer] or Organizations: Yes    Attends Engineer, structural: More than 4 times per year    Marital Status: Never married  Intimate Partner Violence: Not At Risk (08/20/2021)   Humiliation, Afraid, Rape, and Kick questionnaire    Fear of Current or Ex-Partner: No    Emotionally Abused: No    Physically Abused: No    Sexually Abused: No    FAMILY HISTORY:  Family History  Problem Relation Age of Onset   Diabetes Mother    Thyroid disease Mother    Breast cancer Mother 61       CHEK2 pos   Thyroid disease Brother    Diabetes Maternal Grandmother    Melanoma Maternal Grandmother        d. 32   Melanoma Maternal Grandfather        d. 46   Cancer Paternal Grandmother    Breast cancer Cousin 34       mother's pat first cousin   Kidney cancer Other        MGF's sister   Sleep apnea Neg Hx     CURRENT MEDICATIONS:  Outpatient Encounter Medications as of 06/06/2022  Medication Sig Note   albuterol (PROAIR HFA) 108 (90 Base) MCG/ACT inhaler Inhale 1-2 puffs into the lungs every 4  (four) hours as needed.    albuterol (VENTOLIN HFA) 108 (90 Base) MCG/ACT inhaler Inhale 2 puffs into the lungs every 8 (eight) hours as needed for wheezing or shortness of breath. 06/01/2020: Pt states she does not feel like this medication is working.   aspirin EC 81 MG tablet Take 81 mg by mouth daily.    azithromycin (ZITHROMAX) 250 MG tablet take 2 tablets by mouth today, then 1 tab a day for 4 more days    benzonatate (TESSALON) 100 MG capsule Take 1 capsule (100 mg total) by mouth 2 (two) times daily as needed for cough. (Patient taking differently: Take 100 mg by mouth as needed.)    Blood Glucose Monitoring Suppl (FREESTYLE LITE) w/Device KIT Use as directed to test 4 times daily    cefdinir (OMNICEF) 300 MG capsule Take 1 capsule (300 mg total) by mouth 2 (two) times daily with breakfast and supper as directed    cetirizine (ZYRTEC ALLERGY) 10 MG tablet Take 1 tablet (10 mg total) by mouth daily. (Patient taking differently: Take 10 mg by mouth.)    cholestyramine (QUESTRAN) 4 g packet Use 1 packet to mix with water or non-carbonated drink by mouth three times for 30 days    Cyanocobalamin (VITAMIN B-12) 1000 MCG SUBL Place 1 tablet under the tongue.    Dulaglutide (TRULICITY) 1.5 MG/0.5ML SOPN Inject 1.5 mg into the skin once a week.    Empagliflozin-metFORMIN HCl ER (SYNJARDY XR) 25-1000 MG TB24 Take 1 tablet by mouth daily with breakfast.    Empagliflozin-metFORMIN HCl ER (SYNJARDY XR) 25-1000 MG TB24 Take 1 tablet by mouth daily with breakfast.    escitalopram (LEXAPRO) 20 MG tablet TAKE 1 TABLET BY MOUTH ONCE DAILY    escitalopram (LEXAPRO) 20 MG tablet Take 1 tablet (20 mg total) by mouth daily.    Esketamine HCl, 56 MG Dose, (SPRAVATO, 56 MG DOSE,) 28  MG/DEVICE SOPK mornings    Ferrous Sulfate (IRON PO) Take 1 tablet by mouth daily.    fluticasone (FLONASE) 50 MCG/ACT nasal spray Place 2 sprays into both nostrils daily.    gabapentin (NEURONTIN) 300 MG capsule Take 300 mg by mouth  daily as needed (pain).    glucose blood (FREESTYLE LITE) test strip Use to test blood sugar 4 times daily    glucose blood test strip Use to test 2 times daily.    guaiFENesin-codeine (ROBITUSSIN AC) 100-10 MG/5ML syrup Take 5 mLs by mouth 3 (three) times daily as needed for cough. (Patient taking differently: Take 5 mLs by mouth as needed for cough.)    Ibuprofen 200 MG CAPS Take 800 mg by mouth every 8 (eight) hours as needed for moderate pain.     Lancets (FREESTYLE) lancets Use to test blood sugar 4 times daily    levothyroxine (SYNTHROID) 100 MCG tablet Take 1 tablet (100 mcg total) by mouth in the morning on an empty stomach    levothyroxine (SYNTHROID) 75 MCG tablet TAKE 1 TABLET BY MOUTH DAILY EVERY MORNING    methocarbamol (ROBAXIN) 500 MG tablet Take 1 tablet (500 mg total) by mouth every 8 (eight) hours.    Multiple Vitamin (MULTIVITAMIN WITH MINERALS) TABS tablet Take 1 tablet by mouth daily. OTC    naproxen (NAPROSYN) 500 MG tablet Take 1 tablet (500 mg total) by mouth 2 (two) times daily with a meal.    norethindrone-ethinyl estradiol (LOESTRIN) 1-20 MG-MCG tablet Take 1 daily continuously, no inert pills    OneTouch Delica Lancets 33G MISC Use to self monitor blood glucose twice daily (DX: ICD10: R73.01)    sertraline (ZOLOFT) 25 MG tablet TAKE 1 TABLET BY MOUTH ONCE DAILY.    Zinc Sulfate (ZINC 15 PO) Take by mouth. OTC    No facility-administered encounter medications on file as of 06/06/2022.    ALLERGIES:  Allergies  Allergen Reactions   Metformin Hcl Er Other (See Comments)    diarrhea    LABORATORY DATA:  I have reviewed the labs as listed.  CBC    Component Value Date/Time   WBC 12.7 (H) 05/26/2022 1033   RBC 5.10 05/26/2022 1033   HGB 13.2 05/26/2022 1033   HCT 42.3 05/26/2022 1033   PLT 410 (H) 05/26/2022 1033   MCV 82.9 05/26/2022 1033   MCH 25.9 (L) 05/26/2022 1033   MCHC 31.2 05/26/2022 1033   RDW 14.7 05/26/2022 1033   LYMPHSABS 3.7 05/26/2022 1033    MONOABS 0.7 05/26/2022 1033   EOSABS 0.4 05/26/2022 1033   BASOSABS 0.1 05/26/2022 1033      Latest Ref Rng & Units 06/01/2020    6:37 PM 10/16/2018   10:24 AM 11/06/2015    7:52 PM  CMP  Glucose 70 - 99 mg/dL 960  454  098   BUN 6 - 20 mg/dL 15  11  6    Creatinine 0.44 - 1.00 mg/dL 1.19  1.47  8.29   Sodium 135 - 145 mmol/L 133  136  138   Potassium 3.5 - 5.1 mmol/L 4.7  4.5  3.4   Chloride 98 - 111 mmol/L 101  103  105   CO2 22 - 32 mmol/L 20  23  23    Calcium 8.9 - 10.3 mg/dL 9.4  9.1  9.4   Total Protein 6.5 - 8.1 g/dL 7.6  7.1    Total Bilirubin 0.3 - 1.2 mg/dL 0.5  0.7    Alkaline Phos 38 -  126 U/L 68  49    AST 15 - 41 U/L 41  20    ALT 0 - 44 U/L 41  24      DIAGNOSTIC IMAGING:  I have independently reviewed the relevant imaging and discussed with the patient.   WRAP UP:  All questions were answered. The patient knows to call the clinic with any problems, questions or concerns.  Medical decision making: Moderate  Time spent on visit: I spent 20 minutes counseling the patient face to face. The total time spent in the appointment was 30 minutes and more than 50% was on counseling.  Carnella Guadalajara, PA-C  06/06/22 10:02 AM

## 2022-06-06 ENCOUNTER — Encounter: Payer: Self-pay | Admitting: Physician Assistant

## 2022-06-06 ENCOUNTER — Other Ambulatory Visit: Payer: Self-pay

## 2022-06-06 ENCOUNTER — Inpatient Hospital Stay (HOSPITAL_BASED_OUTPATIENT_CLINIC_OR_DEPARTMENT_OTHER): Payer: 59 | Admitting: Physician Assistant

## 2022-06-06 VITALS — BP 121/80 | HR 76 | Temp 97.7°F | Resp 16 | Wt 260.1 lb

## 2022-06-06 DIAGNOSIS — D72829 Elevated white blood cell count, unspecified: Secondary | ICD-10-CM | POA: Diagnosis not present

## 2022-06-06 DIAGNOSIS — D75839 Thrombocytosis, unspecified: Secondary | ICD-10-CM | POA: Diagnosis not present

## 2022-06-06 DIAGNOSIS — Z1509 Genetic susceptibility to other malignant neoplasm: Secondary | ICD-10-CM | POA: Diagnosis not present

## 2022-06-06 DIAGNOSIS — Z1501 Genetic susceptibility to malignant neoplasm of breast: Secondary | ICD-10-CM | POA: Diagnosis not present

## 2022-06-06 DIAGNOSIS — D75838 Other thrombocytosis: Secondary | ICD-10-CM

## 2022-06-06 DIAGNOSIS — Z803 Family history of malignant neoplasm of breast: Secondary | ICD-10-CM | POA: Diagnosis not present

## 2022-06-06 DIAGNOSIS — D500 Iron deficiency anemia secondary to blood loss (chronic): Secondary | ICD-10-CM

## 2022-06-06 DIAGNOSIS — E611 Iron deficiency: Secondary | ICD-10-CM | POA: Diagnosis not present

## 2022-06-06 HISTORY — DX: Iron deficiency anemia secondary to blood loss (chronic): D50.0

## 2022-06-06 NOTE — Patient Instructions (Signed)
[facility] at [facility] **VISIT SUMMARY & IMPORTANT INSTRUCTIONS **   You were seen today by Rojelio Brenner PA-C for your iron deficiency anemia.   Your hemoglobin and red blood cells are normal, but your iron level is low. Your low iron level may be causing some fatigue. We will give you IV iron with Venofer 400 mg x 1.  Please see the attached handout for important information regarding possible side effects. You should still continue to take iron pill once daily.  Your white blood cells and platelets remain mildly elevated but stable.  FOLLOW-UP APPOINTMENT: Office visit in 6 months, with labs the week before   ** Thank you for trusting me with your healthcare!  I strive to provide all of my patients with quality care at each visit.  If you receive a survey for this visit, I would be so grateful to you for taking the time to provide feedback.  Thank you in advance!  ~ Anona Giovannini                   Dr. Doreatha Massed   &   Rojelio Brenner, PA-C   - - - - - - - - - - - - - - - - - -    Thank you for choosing [facility] at [facility] to provide your oncology and hematology care.  To afford each patient quality time with our provider, please arrive at least 15 minutes before your scheduled appointment time.   If you have a lab appointment with the Cancer Center please come in thru the Main Entrance and check in at the main information desk.  You need to re-schedule your appointment should you arrive 10 or more minutes late.  We strive to give you quality time with our providers, and arriving late affects you and other patients whose appointments are after yours.  Also, if you no show three or more times for appointments you may be dismissed from the clinic at the providers discretion.     Again, thank you for choosing [facility].  Our hope is that these requests will decrease the amount of time that you wait before being  seen by our physicians.       _____________________________________________________________  Should you have questions after your visit to [facility], please contact our office at [phone] and follow the prompts.  Our office hours are 8:00 a.m. and 4:30 p.m. Monday - Friday.  Please note that voicemails left after 4:00 p.m. may not be returned until the following business day.  We are closed weekends and major holidays.  You do have access to a nurse 24-7, just call the main number to the clinic [phone] and do not press any options, hold on the line and a nurse will answer the phone.    For prescription refill requests, have your pharmacy contact our office and allow 72 hours.

## 2022-06-10 ENCOUNTER — Inpatient Hospital Stay: Payer: 59 | Attending: Hematology

## 2022-06-10 VITALS — BP 124/71 | HR 80 | Temp 97.6°F | Resp 18

## 2022-06-10 DIAGNOSIS — E611 Iron deficiency: Secondary | ICD-10-CM | POA: Diagnosis not present

## 2022-06-10 DIAGNOSIS — D500 Iron deficiency anemia secondary to blood loss (chronic): Secondary | ICD-10-CM

## 2022-06-10 MED ORDER — SODIUM CHLORIDE 0.9 % IV SOLN
400.0000 mg | Freq: Once | INTRAVENOUS | Status: AC
  Administered 2022-06-10: 400 mg via INTRAVENOUS
  Filled 2022-06-10: qty 20

## 2022-06-10 MED ORDER — ACETAMINOPHEN 325 MG PO TABS
650.0000 mg | ORAL_TABLET | Freq: Once | ORAL | Status: AC
  Administered 2022-06-10: 650 mg via ORAL
  Filled 2022-06-10: qty 2

## 2022-06-10 MED ORDER — SODIUM CHLORIDE 0.9 % IV SOLN: Freq: Once | INTRAVENOUS | Status: AC

## 2022-06-10 NOTE — Progress Notes (Signed)
Patient presents today for iron infusion.  Patient is in satisfactory condition with no new complaints voiced.  Vital signs are stable.  Patient took allergy medication at home this morning so we will hold Zyrtec.  IV placed in L hand.  IV flushed well with good blood return noted.  We will proceed with infusion per provider orders.

## 2022-06-10 NOTE — Progress Notes (Signed)
Patient tolerated iron infusion with no complaints voiced.  Peripheral IV site clean and dry with good blood return noted before and after infusion.  Band aid applied.  VSS with discharge and left in satisfactory condition with no s/s of distress noted.   

## 2022-06-10 NOTE — Patient Instructions (Signed)
[facility]  Discharge Instructions: Thank you for choosing [facility] to provide your oncology and hematology care.  If you have a lab appointment with the Cancer Center - please note that after April 8th, 2024, all labs will be drawn in the cancer center.  You do not have to check in or register with the main entrance as you have in the past but will complete your check-in in the cancer center.  Wear comfortable clothing and clothing appropriate for easy access to any Portacath or PICC line.   We strive to give you quality time with your provider. You may need to reschedule your appointment if you arrive late (15 or more minutes).  Arriving late affects you and other patients whose appointments are after yours.  Also, if you miss three or more appointments without notifying the office, you may be dismissed from the clinic at the provider's discretion.      For prescription refill requests, have your pharmacy contact our office and allow 72 hours for refills to be completed.    Today you received the following Venofer, return as scheduled.   To help prevent nausea and vomiting after your treatment, we encourage you to take your nausea medication as directed.  BELOW ARE SYMPTOMS THAT SHOULD BE REPORTED IMMEDIATELY: *FEVER GREATER THAN 100.4 F (38 C) OR HIGHER *CHILLS OR SWEATING *NAUSEA AND VOMITING THAT IS NOT CONTROLLED WITH YOUR NAUSEA MEDICATION *UNUSUAL SHORTNESS OF BREATH *UNUSUAL BRUISING OR BLEEDING *URINARY PROBLEMS (pain or burning when urinating, or frequent urination) *BOWEL PROBLEMS (unusual diarrhea, constipation, pain near the anus) TENDERNESS IN MOUTH AND THROAT WITH OR WITHOUT PRESENCE OF ULCERS (sore throat, sores in mouth, or a toothache) UNUSUAL RASH, SWELLING OR PAIN  UNUSUAL VAGINAL DISCHARGE OR ITCHING   Items with * indicate a potential emergency and should be followed up as soon as possible or go to the Emergency Department if  any problems should occur.  Please show the CHEMOTHERAPY ALERT CARD or IMMUNOTHERAPY ALERT CARD at check-in to the Emergency Department and triage nurse.  Should you have questions after your visit or need to cancel or reschedule your appointment, please contact [facility] [phone]  and follow the prompts.  Office hours are 8:00 a.m. to 4:30 p.m. Monday - Friday. Please note that voicemails left after 4:00 p.m. may not be returned until the following business day.  We are closed weekends and major holidays. You have access to a nurse at all times for urgent questions. Please call the main number to the clinic [phone] and follow the prompts.  For any non-urgent questions, you may also contact your provider using MyChart. We now offer e-Visits for anyone 18 and older to request care online for non-urgent symptoms. For details visit [email or web address].   Also download the MyChart app! Go to the app store, search "MyChart", open the app, select [facility], and log in with your MyChart username and password.   

## 2022-06-27 ENCOUNTER — Other Ambulatory Visit (HOSPITAL_COMMUNITY): Payer: Self-pay

## 2022-06-27 DIAGNOSIS — D509 Iron deficiency anemia, unspecified: Secondary | ICD-10-CM | POA: Diagnosis not present

## 2022-06-27 DIAGNOSIS — J3501 Chronic tonsillitis: Secondary | ICD-10-CM | POA: Diagnosis not present

## 2022-06-27 DIAGNOSIS — F331 Major depressive disorder, recurrent, moderate: Secondary | ICD-10-CM | POA: Diagnosis not present

## 2022-06-27 DIAGNOSIS — D72829 Elevated white blood cell count, unspecified: Secondary | ICD-10-CM | POA: Diagnosis not present

## 2022-06-27 DIAGNOSIS — E538 Deficiency of other specified B group vitamins: Secondary | ICD-10-CM | POA: Diagnosis not present

## 2022-06-27 DIAGNOSIS — E1169 Type 2 diabetes mellitus with other specified complication: Secondary | ICD-10-CM | POA: Diagnosis not present

## 2022-06-27 DIAGNOSIS — K9089 Other intestinal malabsorption: Secondary | ICD-10-CM | POA: Diagnosis not present

## 2022-06-27 DIAGNOSIS — E039 Hypothyroidism, unspecified: Secondary | ICD-10-CM | POA: Diagnosis not present

## 2022-06-27 MED ORDER — FREESTYLE LIBRE 3 SENSOR MISC
6 refills | Status: DC
  Filled 2022-06-27: qty 2, 28d supply, fill #0
  Filled 2022-07-15 – 2022-09-09 (×2): qty 2, 28d supply, fill #1
  Filled 2022-10-06: qty 2, 28d supply, fill #2
  Filled 2022-11-23: qty 2, 28d supply, fill #3
  Filled 2022-12-19: qty 2, 28d supply, fill #4
  Filled 2023-01-29 – 2023-01-30 (×2): qty 2, 28d supply, fill #5
  Filled 2023-02-26: qty 2, 28d supply, fill #6

## 2022-07-01 DIAGNOSIS — G4733 Obstructive sleep apnea (adult) (pediatric): Secondary | ICD-10-CM | POA: Diagnosis not present

## 2022-07-15 ENCOUNTER — Other Ambulatory Visit (HOSPITAL_COMMUNITY): Payer: Self-pay

## 2022-07-18 ENCOUNTER — Other Ambulatory Visit (HOSPITAL_COMMUNITY): Payer: Self-pay

## 2022-07-26 ENCOUNTER — Other Ambulatory Visit (HOSPITAL_COMMUNITY): Payer: Self-pay

## 2022-07-28 ENCOUNTER — Other Ambulatory Visit (HOSPITAL_COMMUNITY): Payer: Self-pay

## 2022-08-01 DIAGNOSIS — G4733 Obstructive sleep apnea (adult) (pediatric): Secondary | ICD-10-CM | POA: Diagnosis not present

## 2022-08-02 ENCOUNTER — Other Ambulatory Visit (HOSPITAL_COMMUNITY): Payer: Self-pay

## 2022-08-04 ENCOUNTER — Other Ambulatory Visit (HOSPITAL_COMMUNITY): Payer: Self-pay

## 2022-08-30 ENCOUNTER — Other Ambulatory Visit (HOSPITAL_COMMUNITY): Payer: Self-pay

## 2022-08-31 DIAGNOSIS — G4733 Obstructive sleep apnea (adult) (pediatric): Secondary | ICD-10-CM | POA: Diagnosis not present

## 2022-09-09 ENCOUNTER — Other Ambulatory Visit: Payer: Self-pay

## 2022-09-22 ENCOUNTER — Other Ambulatory Visit (HOSPITAL_COMMUNITY): Payer: Self-pay

## 2022-10-01 DIAGNOSIS — G4733 Obstructive sleep apnea (adult) (pediatric): Secondary | ICD-10-CM | POA: Diagnosis not present

## 2022-10-06 ENCOUNTER — Other Ambulatory Visit (HOSPITAL_COMMUNITY): Payer: Self-pay

## 2022-10-07 ENCOUNTER — Other Ambulatory Visit (HOSPITAL_COMMUNITY): Payer: Self-pay

## 2022-10-07 MED ORDER — CHOLESTYRAMINE 4 G PO PACK
4.0000 g | PACK | Freq: Three times a day (TID) | ORAL | 1 refills | Status: DC
  Filled 2022-10-07: qty 90, 30d supply, fill #0
  Filled 2023-01-29: qty 90, 30d supply, fill #1

## 2022-10-11 ENCOUNTER — Other Ambulatory Visit (HOSPITAL_COMMUNITY): Payer: Self-pay

## 2022-10-17 ENCOUNTER — Other Ambulatory Visit (HOSPITAL_COMMUNITY): Payer: Self-pay

## 2022-10-17 ENCOUNTER — Other Ambulatory Visit: Payer: Self-pay | Admitting: Adult Health

## 2022-10-17 MED ORDER — NORETHINDRONE ACET-ETHINYL EST 1-20 MG-MCG PO TABS
ORAL_TABLET | ORAL | 4 refills | Status: DC
  Filled 2022-10-17: qty 84, 56d supply, fill #0

## 2022-10-17 MED ORDER — LEVOTHYROXINE SODIUM 100 MCG PO TABS
100.0000 ug | ORAL_TABLET | Freq: Every morning | ORAL | 3 refills | Status: DC
  Filled 2022-10-17 – 2022-11-07 (×2): qty 90, 90d supply, fill #0
  Filled 2023-02-26: qty 90, 90d supply, fill #1

## 2022-10-17 MED ORDER — SYNJARDY XR 25-1000 MG PO TB24
1.0000 | ORAL_TABLET | Freq: Every day | ORAL | 3 refills | Status: DC
  Filled 2022-10-17 – 2022-11-07 (×2): qty 90, 90d supply, fill #0
  Filled 2023-01-29: qty 90, 90d supply, fill #1
  Filled 2023-02-26 – 2023-04-29 (×2): qty 90, 90d supply, fill #2
  Filled 2023-07-15 – 2023-07-17 (×2): qty 90, 90d supply, fill #3

## 2022-10-19 ENCOUNTER — Other Ambulatory Visit (HOSPITAL_COMMUNITY): Payer: Self-pay

## 2022-10-27 ENCOUNTER — Other Ambulatory Visit (HOSPITAL_COMMUNITY)
Admission: RE | Admit: 2022-10-27 | Discharge: 2022-10-27 | Disposition: A | Discharge status: Home / Self Care | Payer: 59 | Source: Ambulatory Visit | Attending: Adult Health | Admitting: Adult Health

## 2022-10-27 ENCOUNTER — Ambulatory Visit: Payer: 59 | Admitting: Adult Health

## 2022-10-27 ENCOUNTER — Other Ambulatory Visit (HOSPITAL_COMMUNITY): Payer: Self-pay

## 2022-10-27 ENCOUNTER — Encounter: Payer: Self-pay | Admitting: Adult Health

## 2022-10-27 VITALS — BP 133/84 | HR 92 | Ht 65.0 in | Wt 263.5 lb

## 2022-10-27 DIAGNOSIS — N3942 Incontinence without sensory awareness: Secondary | ICD-10-CM

## 2022-10-27 DIAGNOSIS — Z3041 Encounter for surveillance of contraceptive pills: Secondary | ICD-10-CM | POA: Diagnosis not present

## 2022-10-27 DIAGNOSIS — Z01419 Encounter for gynecological examination (general) (routine) without abnormal findings: Secondary | ICD-10-CM | POA: Diagnosis not present

## 2022-10-27 DIAGNOSIS — Z803 Family history of malignant neoplasm of breast: Secondary | ICD-10-CM | POA: Diagnosis not present

## 2022-10-27 DIAGNOSIS — F32A Depression, unspecified: Secondary | ICD-10-CM

## 2022-10-27 DIAGNOSIS — F419 Anxiety disorder, unspecified: Secondary | ICD-10-CM | POA: Diagnosis not present

## 2022-10-27 MED ORDER — ESCITALOPRAM OXALATE 20 MG PO TABS
20.0000 mg | ORAL_TABLET | Freq: Every day | ORAL | 3 refills | Status: DC
  Filled 2022-10-27: qty 90, 90d supply, fill #0
  Filled 2023-02-26: qty 90, 90d supply, fill #1
  Filled 2023-05-31: qty 90, 90d supply, fill #2
  Filled 2023-07-15 – 2023-09-17 (×2): qty 90, 90d supply, fill #3

## 2022-10-27 MED ORDER — NORETHINDRONE ACET-ETHINYL EST 1-20 MG-MCG PO TABS
1.0000 | ORAL_TABLET | Freq: Every day | ORAL | 4 refills | Status: DC
Start: 2022-10-27 — End: 2023-03-03
  Filled 2022-10-27: qty 84, fill #0
  Filled 2023-01-29: qty 84, 84d supply, fill #0

## 2022-10-27 NOTE — Progress Notes (Signed)
Patient ID: Karen Hoover, female   DOB: June 04, 1990, 32 y.o.   MRN: [id number] History of Present Illness: Karen Hoover is a 32 year old white female,single, G0P0, in for a well woman gyn exam and pap. She had a period in August that was heavy, she usually doe not have a period, as she takes pills continuously.  She has had family stress this year, mom made her move out, lives with uncle now. She is having urine leakage without knowing it.  PCP is Dr Link Snuffer   Current Medications, Allergies, Past Medical History, Past Surgical History, Family History and Social History were reviewed in [employer] electronic medical record.     Review of Systems: Patient denies any headaches, hearing loss, fatigue, blurred vision, shortness of breath, chest pain, abdominal pain, problems with bowel movements,  or intercourse(not active). No joint pain or mood swings.  See HPI for positives   Physical Exam:BP 133/84 (BP Location: Right Arm, Patient Position: Sitting, Cuff Size: Normal)   Pulse 92   Ht 5\' 5"  (1.651 m)   Wt 263 lb 8 oz (119.5 kg)   LMP 10/07/2022   BMI 43.85 kg/m   General:  Well developed, well nourished, no acute distress Skin:  Warm and dry Neck:  Midline trachea, normal thyroid, good ROM, no lymphadenopathy Lungs; Clear to auscultation bilaterally Breast:  No dominant palpable mass, retraction, or nipple discharge Cardiovascular: Regular rate and rhythm Abdomen:  Soft, non tender, no hepatosplenomegaly Pelvic:  External genitalia is normal in appearance, no lesions.  The vagina is normal in appearance. Urethra has no lesions or masses. The cervix is smooth, pap with HR HPV genotyping performed.  Uterus is felt to be normal size, shape, and contour.  No adnexal masses or tenderness noted.Bladder is non tender, no masses felt. Extremities/musculoskeletal:  No swelling or varicosities noted, no clubbing or cyanosis Psych:  No mood changes, alert and cooperative,seems happy AA is 1 Fall  risk is low    10/27/2022   11:43 AM 08/20/2021   12:07 PM 07/30/2020    9:42 AM  Depression screen PHQ 2/9  Decreased Interest 0 1 1  Down, Depressed, Hopeless 0 1 2  PHQ - 2 Score 0 2 3  Altered sleeping 0 0 1  Tired, decreased energy 1 1 1   Change in appetite 1 0 1  Feeling bad or failure about yourself  0 1 1  Trouble concentrating 0 1 1  Moving slowly or fidgety/restless 0 0 1  Suicidal thoughts 0 0 0  PHQ-9 Score 2 5 9        10/27/2022   11:43 AM 08/20/2021   12:07 PM 07/30/2020    9:43 AM 07/17/2019   11:16 AM  GAD 7 : Generalized Anxiety Score  Nervous, Anxious, on Edge 1 1 2 1   Control/stop worrying 1 2 2 1   Worry too much - different things 0 2 2 1   Trouble relaxing 0 1 1 0  Restless 0 0 2 0  Easily annoyed or irritable 0 2 3 1   Afraid - awful might happen 1 2 1  0  Total GAD 7 Score 3 10 13 4   Anxiety Difficulty    Not difficult at all      Upstream - 10/27/22 1131       Pregnancy Intention Screening   Does the patient want to become pregnant in the next year? No    Does the patient's partner want to become pregnant in the next year? No  Would the patient like to discuss contraceptive options today? No      Contraception Wrap Up   Current Method Abstinence;Oral Contraceptive    End Method Oral Contraceptive    Contraception Counseling Provided Yes             Examination chaperoned by Malachy Mood LPN  Impression and Plan: 1. Encounter for gynecological examination with Papanicolaou smear of cervix Pap sent Pap in 3 years if normal Physical in 1 year Labs with PCP - Cytology - PAP( [place])  2. Encounter for surveillance of contraceptive pills Will continue BCP Meds ordered this encounter  Medications   escitalopram (LEXAPRO) 20 MG tablet    Sig: Take 1 tablet (20 mg total) by mouth daily.    Dispense:  90 tablet    Refill:  3    Order Specific Question:   Supervising Provider    Answer:   Duane Lope H [[id number]]   norethindrone-ethinyl  estradiol (LOESTRIN) 1-20 MG-MCG tablet    Sig: Take 1 daily continuously, no inert pills    Dispense:  84 tablet    Refill:  4    Order Specific Question:   Supervising Provider    Answer:   Despina Hidden, LUTHER H [[id number]]     3. Family history of breast cancer in mother Will start mammogram at 82, mom was in her 83's when had   4. Anxiety and depression Refilled lexapro 20 mg 1 daily and she is seeing a counselor once a month now  5. Urinary incontinence without sensory awareness +UI Will refer to urology  - Ambulatory referral to Urology

## 2022-10-31 IMAGING — DX DG FOOT COMPLETE 3+V*R*
3 series · 3 of 3 positions shown · non-contrast
Comparison: None.

CLINICAL DATA: Injury to RIGHT great toe last night.

EXAM:
RIGHT FOOT COMPLETE - 3+ VIEW

[foot ap]
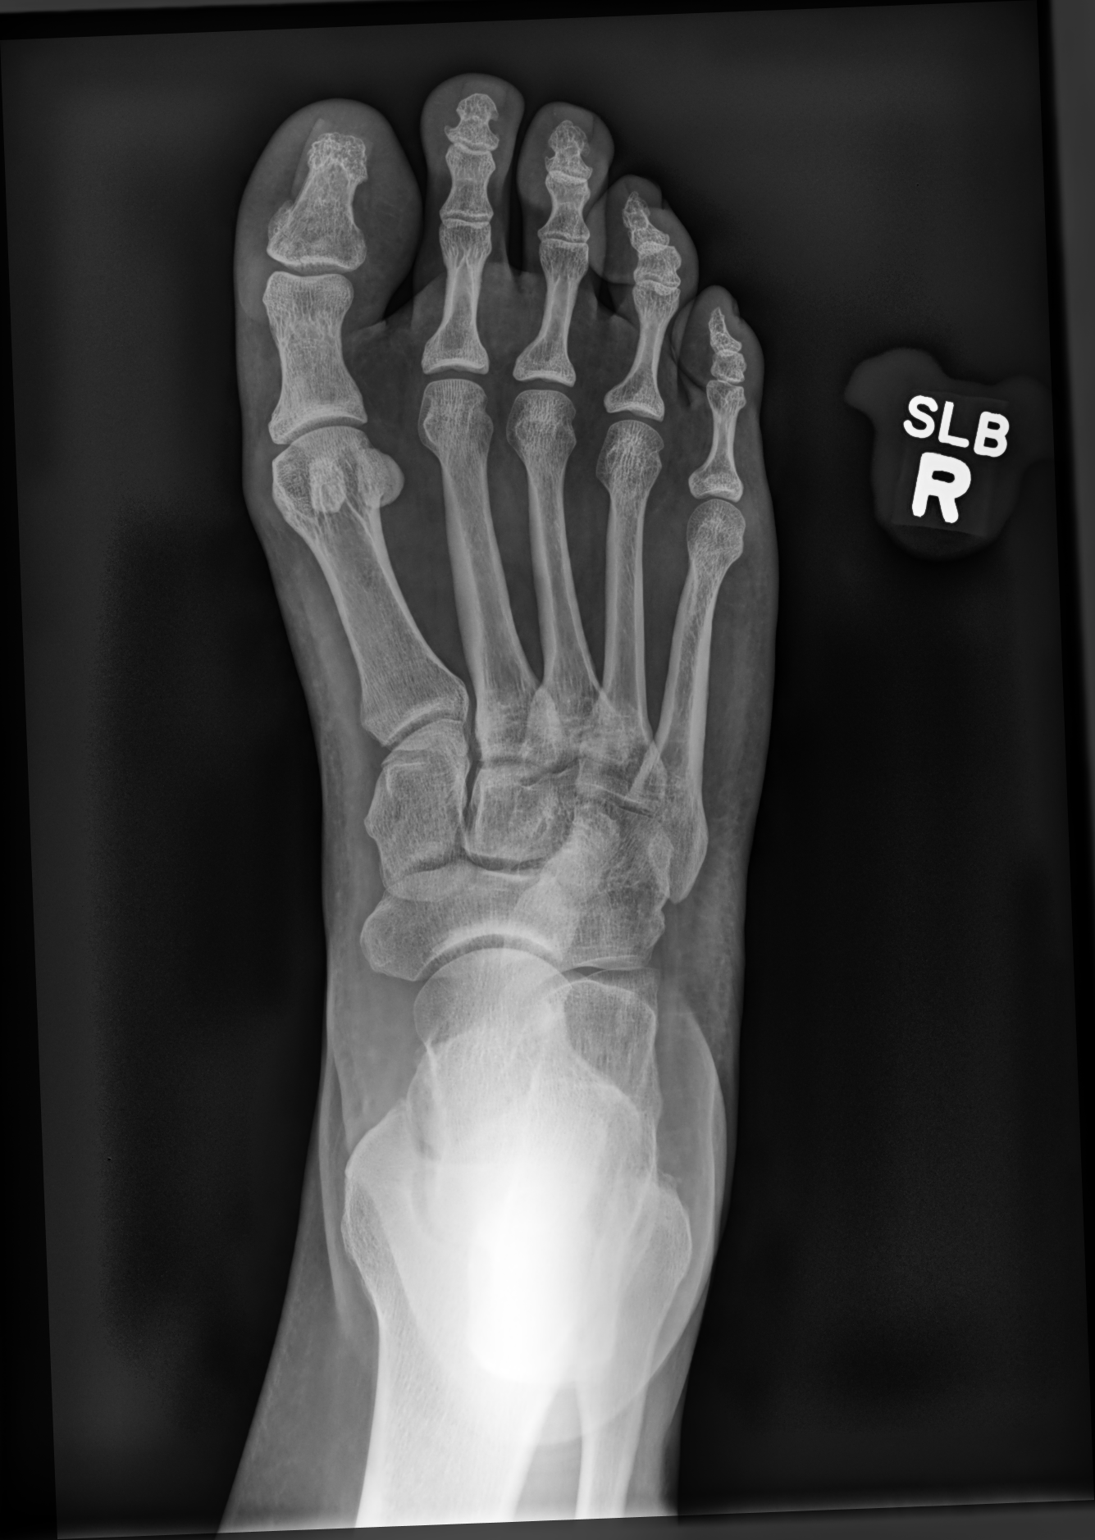

[foot mlo]
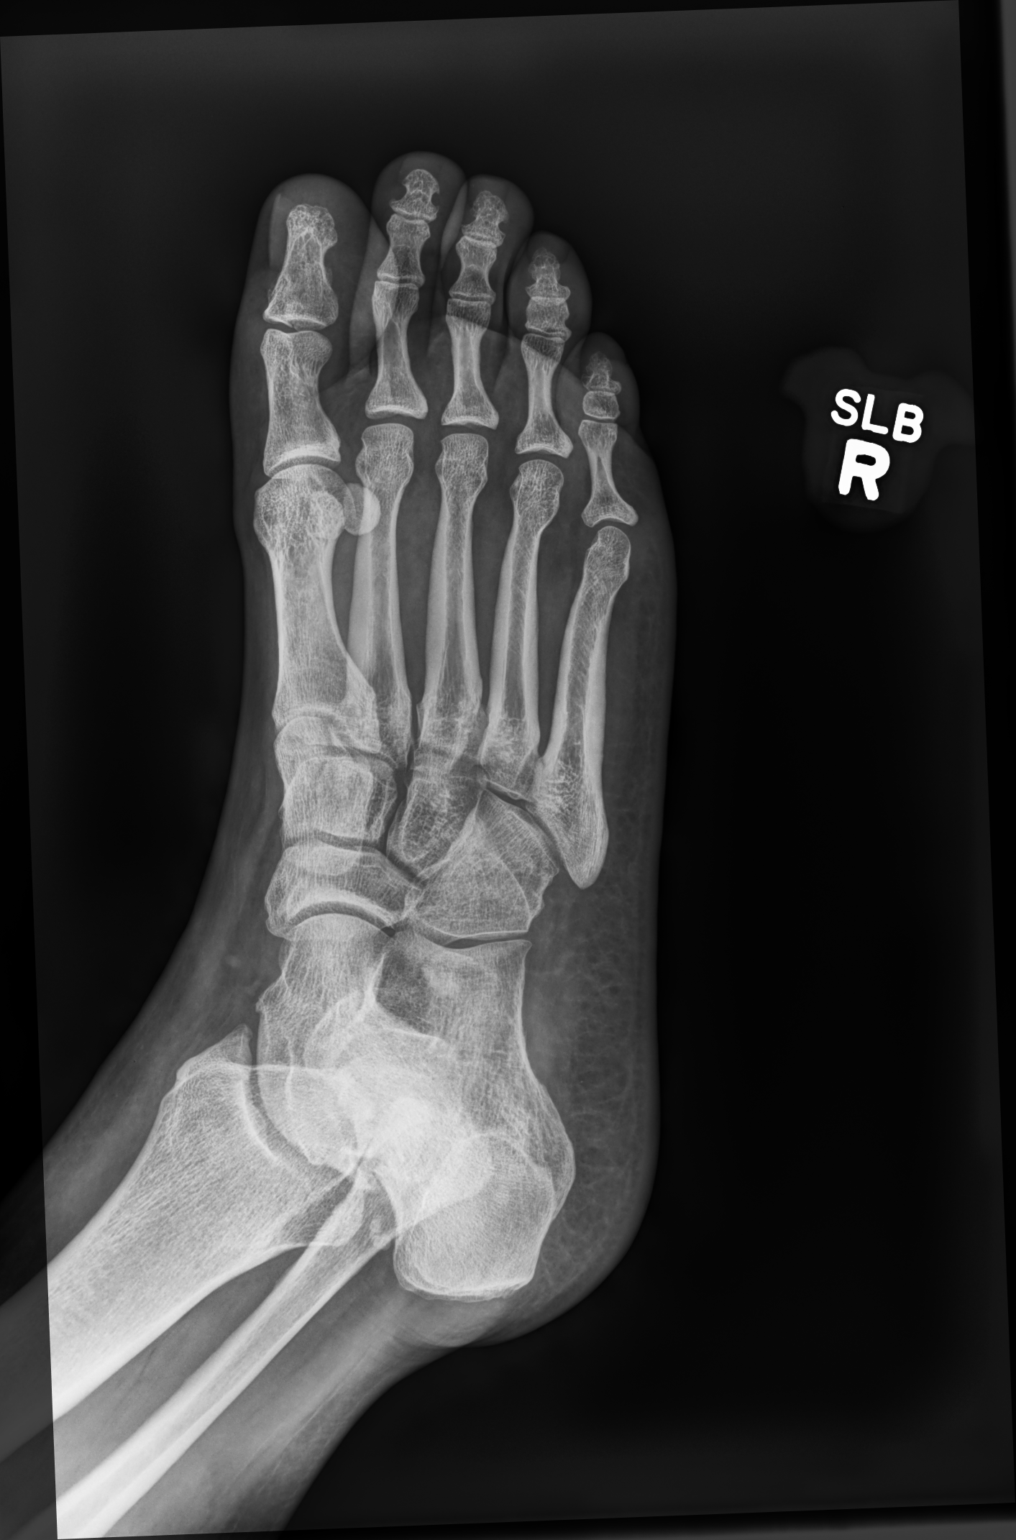

[foot lat]
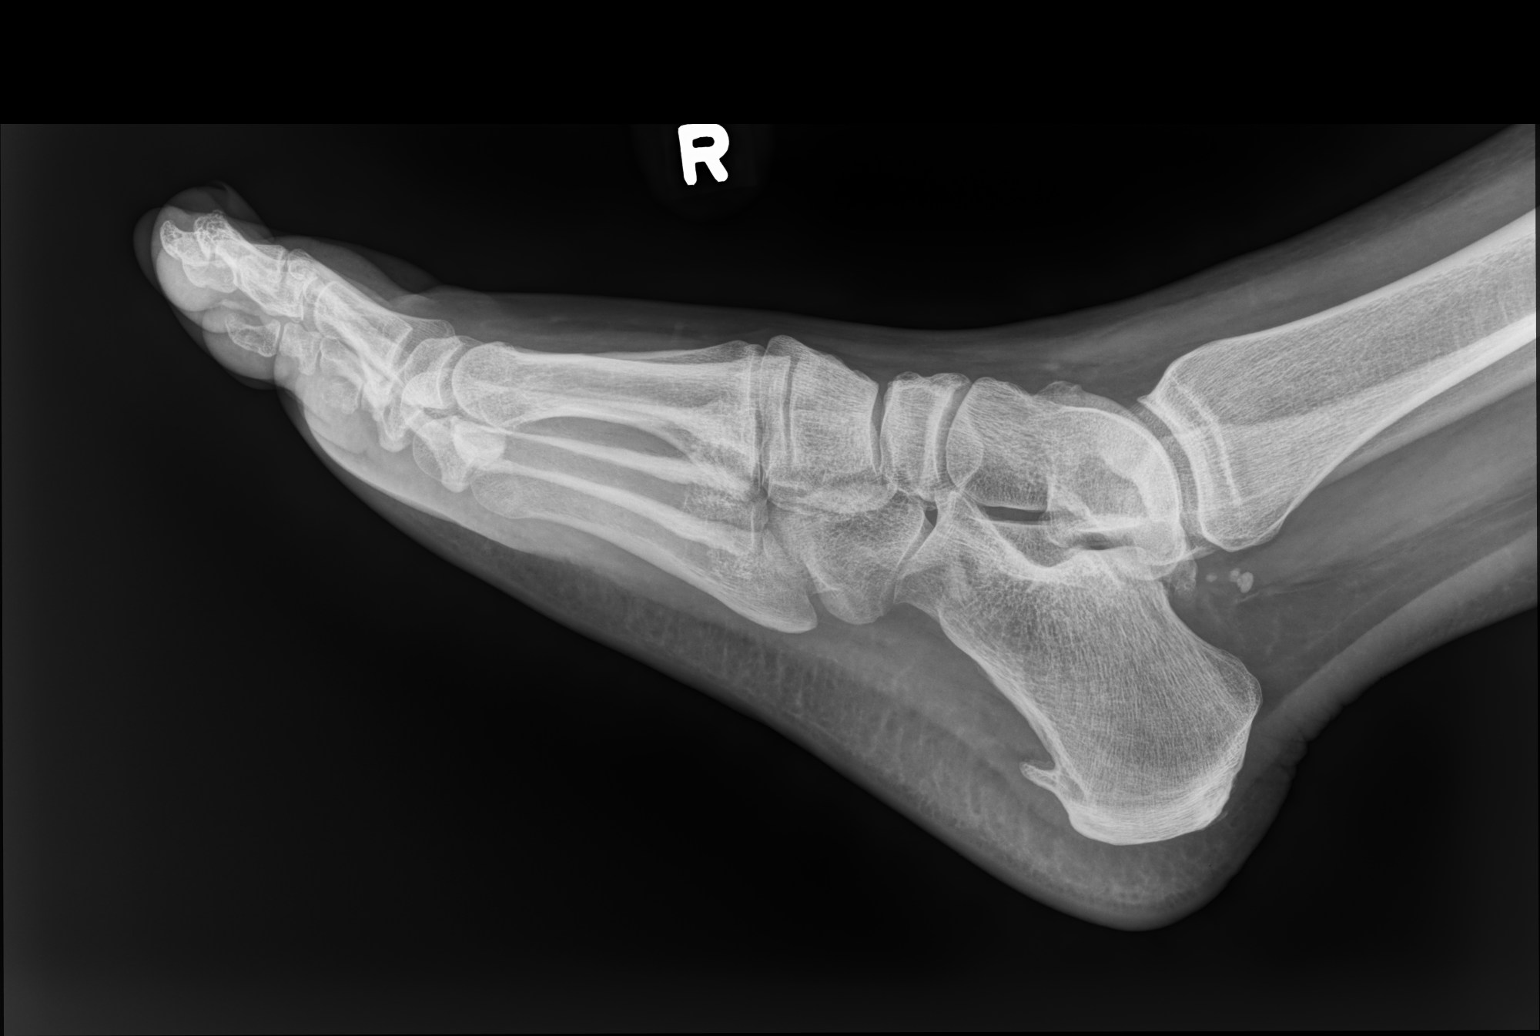

[3 of 3 positions shown; findings below may reference images not displayed]

FINDINGS: Nondisplaced fracture at the articular surface of the distal phalanx
of the RIGHT great toe. Interphalangeal joint space is normally
aligned. Proximal phalanx of the great toe appears intact and
normally aligned. Remainder of the osseous structures of the RIGHT
foot appear intact and normally aligned.
IMPRESSION: Nondisplaced fracture at the base of the distal phalanx of the RIGHT
great toe, with extension to the articular surface. Underlying
interphalangeal joint space remains normally aligned.

## 2022-11-01 DIAGNOSIS — G4733 Obstructive sleep apnea (adult) (pediatric): Secondary | ICD-10-CM | POA: Diagnosis not present

## 2022-11-02 LAB — CYTOLOGY - PAP
Chlamydia: NEGATIVE
Comment: NEGATIVE
Comment: NEGATIVE
Comment: NORMAL
Diagnosis: NEGATIVE
High risk HPV: NEGATIVE
Neisseria Gonorrhea: NEGATIVE

## 2022-11-07 ENCOUNTER — Other Ambulatory Visit (HOSPITAL_COMMUNITY): Payer: Self-pay

## 2022-11-07 ENCOUNTER — Other Ambulatory Visit: Payer: Self-pay

## 2022-11-14 NOTE — Progress Notes (Deleted)
Name: Karen Hoover DOB: 1991/01/23 MRN: [id number]  History of Present Illness: Karen Hoover is a 32 y.o. female who presents today for new patient visit at [facility]. Interstitial cystitis. - Previously evaluated by Dr. Patsi Sears at [facility] in 2018. Urinary incontinence. - Previously ***did pelvic floor physical therapy.  Today: She reports ***  She {Actions; denies-reports:120008} urge incontinence. She {Actions; denies-reports:120008} stress incontinence with ***cough/***laugh/***sneeze/***heavy lifting /***exercise. She {Actions; denies-reports:120008} urinary incontinence without sensory awareness. She {Actions; denies-reports:120008} nocturnal enuresis.  ***may need urodynamic testing (previously had in 2018 at [facility]?) ***Request prior records from [facility]  She reports the ***SUI / ***UUI is predominant.  She leaks *** times per ***. Wears *** ***pads/ ***diapers per day. She reports urinary incontinence {ACTION; IS/IS VEL:[id number]} significantly bothersome.   Bladder pain is described as ***/10, ***, *** constant I intermittent***. She urinates *** times per day and *** times at night. She {Actions; denies-reports:120008}  urgency. She {Actions; denies-reports:120008}  dysuria. She {Actions; denies-reports:120008} gross hematuria. She {Actions; denies-reports:120008}  the need to strain to void. She {Actions; denies-reports:120008}  sensations of incomplete emptying.  Current treatment regimen includes: - Gabapentin Robaxin Zyrtec   Previous treatments have included: Elmiron  *** Hydroxyzine Singulair Trazodone I Elavil / Cymbalta Ditropan / Myrbetriq bladder instillations topical Lidocaine ointment/jelly Urogesic blue PRN / Uribel PRN Pyridium PRN Bentyl PRN / Levsin PRN CystoProtek, Cysta-Q, Prelief, Freeze-dried aloe vera pelvic floor physical therapy cysto/HOD ***  Fall Screening: Do you usually have a  device to assist in your mobility? {yes/no:20286} ***cane / ***walker / ***wheelchair  Medications: Current Outpatient Medications  Medication Sig Dispense Refill   albuterol (PROAIR HFA) 108 (90 Base) MCG/ACT inhaler Inhale 1-2 puffs into the lungs every 4 (four) hours as needed. 6.7 g 0   aspirin EC 81 MG tablet Take 81 mg by mouth daily.     benzonatate (TESSALON) 100 MG capsule Take 1 capsule (100 mg total) by mouth 2 (two) times daily as needed for cough. (Patient taking differently: Take 100 mg by mouth as needed.) 15 capsule 0   Blood Glucose Monitoring Suppl (FREESTYLE LITE) w/Device KIT Use as directed to test 4 times daily 1 kit 0   cetirizine (ZYRTEC ALLERGY) 10 MG tablet Take 1 tablet (10 mg total) by mouth daily. (Patient taking differently: Take 10 mg by mouth.) 30 tablet 0   cholestyramine (QUESTRAN) 4 g packet Take 1 packet mixed with water or non-carbonated drink by mouth three times a day. 90 packet 1   Continuous Glucose Sensor (FREESTYLE LIBRE 3 SENSOR) MISC Change every 14 days to monitor blood glucose continously 2 each 6   Cyanocobalamin (VITAMIN B-12) 1000 MCG SUBL Place 1 tablet under the tongue.     Dulaglutide (TRULICITY) 1.5 MG/0.5ML SOPN Inject 1.5 mg into the skin once a week. 2 mL 6   Empagliflozin-metFORMIN HCl ER (SYNJARDY XR) 25-1000 MG TB24 Take 1 tablet by mouth daily with breakfast. 30 tablet 6   Empagliflozin-metFORMIN HCl ER (SYNJARDY XR) 25-1000 MG TB24 Take 1 tablet by mouth daily with breakfast. 90 tablet 3   escitalopram (LEXAPRO) 20 MG tablet Take 1 tablet (20 mg total) by mouth daily. 90 tablet 3   Ferrous Sulfate (IRON PO) Take 1 tablet by mouth daily.     fluticasone (FLONASE) 50 MCG/ACT nasal spray Place 2 sprays into both nostrils daily. 16 g 2   gabapentin (NEURONTIN) 300 MG capsule Take 300 mg by mouth daily as needed (pain).  glucose blood (FREESTYLE LITE) test strip Use to test blood sugar 4 times daily 100 strip 5   glucose blood test  strip Use to test 2 times daily. 100 each 3   guaiFENesin-codeine (ROBITUSSIN AC) 100-10 MG/5ML syrup Take 5 mLs by mouth 3 (three) times daily as needed for cough. (Patient taking differently: Take 5 mLs by mouth as needed for cough.) 120 mL 0   Ibuprofen 200 MG CAPS Take 800 mg by mouth every 8 (eight) hours as needed for moderate pain.      Lancets (FREESTYLE) lancets Use to test blood sugar 4 times daily 400 each 5   levothyroxine (SYNTHROID) 100 MCG tablet Take 1 tablet (100 mcg total) by mouth in the morning on an empty stomach 90 tablet 3   methocarbamol (ROBAXIN) 500 MG tablet Take 1 tablet (500 mg total) by mouth every 8 (eight) hours. 90 tablet 0   Multiple Vitamin (MULTIVITAMIN WITH MINERALS) TABS tablet Take 1 tablet by mouth daily. OTC     naproxen (NAPROSYN) 500 MG tablet Take 1 tablet (500 mg total) by mouth 2 (two) times daily with a meal. 30 tablet 0   norethindrone-ethinyl estradiol (LOESTRIN) 1-20 MG-MCG tablet Take 1 tablet by mouth daily, continuously, no inert pills 84 tablet 4   OneTouch Delica Lancets 33G MISC Use to self monitor blood glucose twice daily (DX: ICD10: R73.01)     Zinc Sulfate (ZINC 15 PO) Take by mouth. OTC     No current facility-administered medications for this visit.    Allergies: Allergies  Allergen Reactions   Metformin Hcl Er Other (See Comments)    diarrhea    Past Medical History:  Diagnosis Date   Anemia    Anxiety 03/17/2015   BV (bacterial vaginosis) 04/10/2014   Chronic lower back pain    2 herniated disc sees Dr Channing Mutters   Complication of anesthesia    O2 desaturation, Hypotension, Fluid filled lungs   Diabetes mellitus without complication (HCC)    Encounter for menstrual regulation 03/13/2014   Family history of breast cancer    Family history of melanoma    Fatty liver 09/05/2018   Gallstones 09/05/2018   Refer to Dr Henreitta Leber   Hemorrhoids 01/12/2015   History of rectal bleeding 01/12/2015   Hypothyroidism    IC (interstitial  cystitis) 01/22/2015   Iron deficiency anemia due to chronic blood loss 06/06/2022   Pulmonary edema 08/21/2015   S/P back OR   Sleep apnea    Thyroid disease    Tonsillitis    Vaginal discharge 03/13/2014   Vaginal itching 01/12/2015   Vestibulitis, vulvar 04/21/2014   Yeast infection 03/13/2014   Past Surgical History:  Procedure Laterality Date   BACK SURGERY     CHOLECYSTECTOMY N/A 10/17/2018   Procedure: LAPAROSCOPIC CHOLECYSTECTOMY;  Surgeon: Lucretia Roers, MD;  Location: [facility];  Service: General;  Laterality: N/A;   LAMINECTOMY AND MICRODISCECTOMY LUMBAR SPINE Right 08/21/2015   L4-5 and L5-S 1   Family History  Problem Relation Age of Onset   Diabetes Mother    Thyroid disease Mother    Breast cancer Mother 43       CHEK2 pos   Thyroid disease Brother    Diabetes Maternal Grandmother    Melanoma Maternal Grandmother        d. 63   Melanoma Maternal Grandfather        d. 18   Cancer Paternal Grandmother    Breast cancer Cousin 72  mother's pat first cousin   Kidney cancer Other        MGF's sister   Sleep apnea Neg Hx    Social History   Socioeconomic History   Marital status: Single    Spouse name: Not on file   Number of children: Not on file   Years of education: Not on file   Highest education level: Not on file  Occupational History   Occupation: Surgical Instrument cleaner    Employer: [employer]  Tobacco Use   Smoking status: Never   Smokeless tobacco: Never  Vaping Use   Vaping status: Never Used  Substance and Sexual Activity   Alcohol use: Yes    Comment: occasionally    Drug use: No   Sexual activity: Not Currently    Birth control/protection: Condom, Pill  Other Topics Concern   Not on file  Social History Narrative   Not on file   Social Determinants of Health   Financial Resource Strain: Low Risk  (10/27/2022)   Overall Financial Resource Strain (CARDIA)    Difficulty of Paying Living Expenses: Not very hard   Food Insecurity: Food Insecurity Present (10/27/2022)   Hunger Vital Sign    Worried About Running Out of Food in the Last Year: Never true    Ran Out of Food in the Last Year: Sometimes true  Transportation Needs: No Transportation Needs (10/27/2022)   PRAPARE - Administrator, Civil Service (Medical): No    Lack of Transportation (Non-Medical): No  Physical Activity: Insufficiently Active (10/27/2022)   Exercise Vital Sign    Days of Exercise per Week: 1 day    Minutes of Exercise per Session: 30 min  Stress: No Stress Concern Present (10/27/2022)   [employer] of Occupational Health - Occupational Stress Questionnaire    Feeling of Stress : Only a little  Social Connections: Moderately Integrated (10/27/2022)   Social Connection and Isolation Panel [NHANES]    Frequency of Communication with Friends and Family: More than three times a week    Frequency of Social Gatherings with Friends and Family: Twice a week    Attends Religious Services: More than 4 times per year    Active Member of [employer] or Organizations: Yes    Attends Banker Meetings: 1 to 4 times per year    Marital Status: Never married  Intimate Partner Violence: Not At Risk (10/27/2022)   Humiliation, Afraid, Rape, and Kick questionnaire    Fear of Current or Ex-Partner: No    Emotionally Abused: No    Physically Abused: No    Sexually Abused: No    SUBJECTIVE  Review of Systems*** Constitutional: Patient denies any unintentional weight loss or change in strength lntegumentary: Patient denies any rashes or pruritus Eyes: Patient denies ***dry eyes ENT: Patient ***denies dry mouth Cardiovascular: Patient denies chest pain or syncope Respiratory: Patient denies shortness of breath Gastrointestinal: Patient ***denies nausea, vomiting, constipation, or diarrhea Musculoskeletal: Patient denies muscle cramps or weakness Neurologic: Patient denies convulsions or  seizures Allergic/Immunologic: Patient denies recent allergic reaction(s) Hematologic/Lymphatic: Patient denies bleeding tendencies Endocrine: Patient denies heat/cold intolerance  GU: As per HPI.  OBJECTIVE There were no vitals filed for this visit. There is no height or weight on file to calculate BMI.  Physical Examination*** Constitutional: No obvious distress; patient is non-toxic appearing  Cardiovascular: No visible lower extremity edema.  Respiratory: The patient does not have audible wheezing/stridor; respirations do not appear labored  Gastrointestinal: Abdomen  non-distended Musculoskeletal: Normal ROM of UEs  Skin: No obvious rashes/open sores  Neurologic: CN 2-12 grossly intact Psychiatric: Answered questions appropriately with normal affect  Hematologic/Lymphatic/Immunologic: No obvious bruises or sites of spontaneous bleeding  UA: ***negative *** WBC/hpf, *** RBC/hpf, bacteria (***) PVR: *** ml  ASSESSMENT IC (interstitial cystitis)  She is doing ***well.  Will continue current medications; refills ***sent.  Will plan for follow up in *** months / ***1 year or sooner if needed. Pt verbalized understanding and agreement. All questions were answered.  PLAN Advised the following: Continue current medications as prescribed. 2. ***No follow-ups on file.  No orders of the defined types were placed in this encounter.   It has been explained that the patient is to follow regularly with their PCP in addition to all other providers involved in their care and to follow instructions provided by these respective offices. Patient advised to contact urology clinic if any urologic-pertaining questions, concerns, new symptoms or problems arise in the interim period.  There are no Patient Instructions on file for this visit.  Electronically signed by:  Donnita Falls, MSN, FNP-C, CUNP 11/14/2022 2:08 PM

## 2022-11-17 NOTE — Progress Notes (Signed)
Name: Karen Hoover DOB: Nov 25, 1990 MRN: [id number]  History of Present Illness: [name] is a 32 y.o. female who presents today for new patient visit at [facility] [place]. Previously evaluated by Karen Hoover at [facility] in 2018.  Today: She reports that over the past year or so she has had bothersome urinary urgency, frequency, nocturia x1. She reports mixed urinary incontinence; unable to differentiate one being more severe / predominant versus the other. She reports occasional urinary incontinence without sensory awareness. She denies nocturnal enuresis. She leaks small volumes of urine a couple times per day. Wears 1-2 thin liners per day on average. Previously did pelvic floor physical therapy but states that was not very helpful.  Denies bladder pain, dysuria, gross hematuria, hesitancy, straining to void, or sensations of incomplete emptying.  She denies history of recent or recurrent UTI. She denies history of kidney stones.  Current treatment regimen includes: - Gabapentin PRN Robaxin PRN  Zyrtec  Previous treatments have included: Elmiron   Fall Screening: Do you usually have a device to assist in your mobility? No   Medications: Current Outpatient Medications  Medication Sig Dispense Refill   albuterol (PROAIR HFA) 108 (90 Base) MCG/ACT inhaler Inhale 1-2 puffs into the lungs every 4 (four) hours as needed. 6.7 g 0   aspirin EC 81 MG tablet Take 81 mg by mouth daily.     benzonatate (TESSALON) 100 MG capsule Take 1 capsule (100 mg total) by mouth 2 (two) times daily as needed for cough. (Patient taking differently: Take 100 mg by mouth as needed.) 15 capsule 0   Blood Glucose Monitoring Suppl (FREESTYLE LITE) w/Device KIT Use as directed to test 4 times daily 1 kit 0   cetirizine (ZYRTEC ALLERGY) 10 MG tablet Take 1 tablet (10 mg total) by mouth daily. (Patient taking differently: Take 10 mg by mouth.) 30 tablet 0   cholestyramine  (QUESTRAN) 4 g packet Take 1 packet mixed with water or non-carbonated drink by mouth three times a day. 90 packet 1   Continuous Glucose Sensor (FREESTYLE LIBRE 3 SENSOR) MISC Change every 14 days to monitor blood glucose continously 2 each 6   Cyanocobalamin (VITAMIN B-12) 1000 MCG SUBL Place 1 tablet under the tongue.     Dulaglutide (TRULICITY) 1.5 MG/0.5ML SOPN Inject 1.5 mg into the skin once a week. 2 mL 6   Empagliflozin-metFORMIN HCl ER (SYNJARDY XR) 25-1000 MG TB24 Take 1 tablet by mouth daily with breakfast. 30 tablet 6   Empagliflozin-metFORMIN HCl ER (SYNJARDY XR) 25-1000 MG TB24 Take 1 tablet by mouth daily with breakfast. 90 tablet 3   escitalopram (LEXAPRO) 20 MG tablet Take 1 tablet (20 mg total) by mouth daily. 90 tablet 3   Ferrous Sulfate (IRON PO) Take 1 tablet by mouth daily.     fluticasone (FLONASE) 50 MCG/ACT nasal spray Place 2 sprays into both nostrils daily. 16 g 2   gabapentin (NEURONTIN) 300 MG capsule Take 300 mg by mouth daily as needed (pain).     glucose blood (FREESTYLE LITE) test strip Use to test blood sugar 4 times daily 100 strip 5   glucose blood test strip Use to test 2 times daily. 100 each 3   guaiFENesin-codeine (ROBITUSSIN AC) 100-10 MG/5ML syrup Take 5 mLs by mouth 3 (three) times daily as needed for cough. (Patient taking differently: Take 5 mLs by mouth as needed for cough.) 120 mL 0   Ibuprofen 200 MG CAPS Take 800 mg by mouth every  8 (eight) hours as needed for moderate pain.      Lancets (FREESTYLE) lancets Use to test blood sugar 4 times daily 400 each 5   levothyroxine (SYNTHROID) 100 MCG tablet Take 1 tablet (100 mcg total) by mouth in the morning on an empty stomach 90 tablet 3   Multiple Vitamin (MULTIVITAMIN WITH MINERALS) TABS tablet Take 1 tablet by mouth daily. OTC     naproxen (NAPROSYN) 500 MG tablet Take 1 tablet (500 mg total) by mouth 2 (two) times daily with a meal. 30 tablet 0   norethindrone-ethinyl estradiol (LOESTRIN) 1-20 MG-MCG  tablet Take 1 tablet by mouth daily, continuously, no inert pills 84 tablet 4   OneTouch Delica Lancets 33G MISC Use to self monitor blood glucose twice daily (DX: ICD10: R73.01)     solifenacin (VESICARE) 10 MG tablet Take 1 tablet (10 mg total) by mouth daily. 30 tablet 5   Zinc Sulfate (ZINC 15 PO) Take by mouth. OTC     methocarbamol (ROBAXIN) 500 MG tablet Take 1 tablet (500 mg total) by mouth every 8 (eight) hours. (Patient not taking: Reported on 11/28/2022) 90 tablet 0   No current facility-administered medications for this visit.    Allergies: Allergies  Allergen Reactions   Metformin Hcl Er Other (See Comments)    diarrhea    Past Medical History:  Diagnosis Date   Anemia    Anxiety 03/17/2015   BV (bacterial vaginosis) 04/10/2014   Chronic lower back pain    2 herniated disc sees Karen Hoover   Complication of anesthesia    O2 desaturation, Hypotension, Fluid filled lungs   Diabetes mellitus without complication (HCC)    Encounter for menstrual regulation 03/13/2014   Family history of breast cancer    Family history of melanoma    Fatty liver 09/05/2018   Gallstones 09/05/2018   Refer to Karen Hoover   Hemorrhoids 01/12/2015   History of rectal bleeding 01/12/2015   Hypothyroidism    IC (interstitial cystitis) 01/22/2015   Iron deficiency anemia due to chronic blood loss 06/06/2022   Pulmonary edema 08/21/2015   S/P back OR   Sleep apnea    Thyroid disease    Tonsillitis    Vaginal discharge 03/13/2014   Vaginal itching 01/12/2015   Vestibulitis, vulvar 04/21/2014   Yeast infection 03/13/2014   Past Surgical History:  Procedure Laterality Date   BACK SURGERY     CHOLECYSTECTOMY N/A 10/17/2018   Procedure: LAPAROSCOPIC CHOLECYSTECTOMY;  Surgeon: Karen Roers, MD;  Location: [facility];  Service: General;  Laterality: N/A;   LAMINECTOMY AND MICRODISCECTOMY LUMBAR SPINE Right 08/21/2015   L4-5 and L5-S 1   Family History  Problem Relation Age of Onset    Diabetes Mother    Thyroid disease Mother    Breast cancer Mother 60       CHEK2 pos   Thyroid disease Brother    Diabetes Maternal Grandmother    Melanoma Maternal Grandmother        d. 34   Melanoma Maternal Grandfather        d. 42   Cancer Paternal Grandmother    Breast cancer Cousin 33       mother's pat first cousin   Kidney cancer Other        MGF's sister   Sleep apnea Neg Hx    Social History   Socioeconomic History   Marital status: Single    Spouse name: Not on file   Number of children:  Not on file   Years of education: Not on file   Highest education level: Not on file  Occupational History   Occupation: Surgical Instrument cleaner    Employer: [employer]  Tobacco Use   Smoking status: Never   Smokeless tobacco: Never  Vaping Use   Vaping status: Never Used  Substance and Sexual Activity   Alcohol use: Yes    Comment: occasionally    Drug use: No   Sexual activity: Not Currently    Birth control/protection: Condom, Pill  Other Topics Concern   Not on file  Social History Narrative   Not on file   Social Determinants of Health   Financial Resource Strain: Low Risk  (10/27/2022)   Overall Financial Resource Strain (CARDIA)    Difficulty of Paying Living Expenses: Not very hard  Food Insecurity: Food Insecurity Present (10/27/2022)   Hunger Vital Sign    Worried About Running Out of Food in the Last Year: Never true    Ran Out of Food in the Last Year: Sometimes true  Transportation Needs: No Transportation Needs (10/27/2022)   PRAPARE - Administrator, Civil Service (Medical): No    Lack of Transportation (Non-Medical): No  Physical Activity: Insufficiently Active (10/27/2022)   Exercise Vital Sign    Days of Exercise per Week: 1 day    Minutes of Exercise per Session: 30 min  Stress: No Stress Concern Present (10/27/2022)   [employer] of Occupational Health - Occupational Stress Questionnaire    Feeling of Stress : Only  a little  Social Connections: Moderately Integrated (10/27/2022)   Social Connection and Isolation Panel [NHANES]    Frequency of Communication with Friends and Family: More than three times a week    Frequency of Social Gatherings with Friends and Family: Twice a week    Attends Religious Services: More than 4 times per year    Active Member of [employer] or Organizations: Yes    Attends Banker Meetings: 1 to 4 times per year    Marital Status: Never married  Intimate Partner Violence: Not At Risk (10/27/2022)   Humiliation, Afraid, Rape, and Kick questionnaire    Fear of Current or Ex-Partner: No    Emotionally Abused: No    Physically Abused: No    Sexually Abused: No    SUBJECTIVE  Review of Systems Constitutional: Patient denies any unintentional weight loss or change in strength lntegumentary: Patient denies any rashes or pruritus Eyes: Patient denies dry eyes ENT: Patient denies dry mouth Cardiovascular: Patient denies chest pain or syncope Respiratory: Patient denies shortness of breath Gastrointestinal: Patient denies nausea, vomiting, constipation, or diarrhea Musculoskeletal: Patient denies muscle cramps or weakness Neurologic: Patient denies convulsions or seizures Allergic/Immunologic: Patient denies recent allergic reaction(s) Hematologic/Lymphatic: Patient denies bleeding tendencies Endocrine: Patient denies heat/cold intolerance  GU: As per HPI.  OBJECTIVE Vitals:   11/28/22 0927  BP: 95/63  Pulse: 97  Temp: 98.1 F (36.7 C)   There is no height or weight on file to calculate BMI.  Physical Examination Constitutional: No obvious distress; patient is non-toxic appearing  Cardiovascular: No visible lower extremity edema.  Respiratory: The patient does not have audible wheezing/stridor; respirations do not appear labored  Gastrointestinal: Abdomen non-distended Musculoskeletal: Normal ROM of UEs  Skin: No obvious rashes/open sores  Neurologic:  CN 2-12 grossly intact Psychiatric: Answered questions appropriately with normal affect  Hematologic/Lymphatic/Immunologic: No obvious bruises or sites of spontaneous bleeding   ASSESSMENT OAB (overactive  bladder) - Plan: Urinalysis, Routine w reflex microscopic  Urge incontinence - Plan: BLADDER SCAN AMB NON-IMAGING, Urinalysis, dipstick only, solifenacin (VESICARE) 10 MG tablet, Urinalysis, Routine w reflex microscopic  SUI (stress urinary incontinence, female) - Plan: Urinalysis, Routine w reflex microscopic  Urinary incontinence without sensory awareness - Plan: BLADDER SCAN AMB NON-IMAGING, Urinalysis, dipstick only, solifenacin (VESICARE) 10 MG tablet, Urinalysis, Routine w reflex microscopic  Mixed stress and urge urinary incontinence - Plan: Urinalysis, Routine w reflex microscopic  We discussed the different forms of urinary incontinence, such as stress and urge incontinence, and described how symptoms are consistent with mixed urinary incontinence.  1. For treatment of stress urinary incontinence: We discussed expectant management versus nonsurgical options versus surgery. Nonsurgical options include weight loss, Kegel exercises, with or without physical therapy, as well as an incontinence pessary. Surgical options may include midurethral sling, Burch urethropexy, autologous fascial sling, or transurethral injection of a bulking agent.   She elected to prioritize OAB management for now and will consider SUI treatment options if needed in the future.  2. For treatment of OAB with urinary frequency, nocturia, urgency, and urge incontinence: We discussed the symptoms of overactive bladder (OAB), which include urinary urgency, frequency, nocturia, with or without urge incontinence.   While we may not know the exact etiology of OAB, several risk factors can be identified.  - We discussed this patient's neurogenic risk factors for OAB-type symptoms including T2DM.  - Possibly  exacerbated by glucosuria due to [facility] medication.   We discussed the following management options in detail including potential benefits, risks, and side effects: Behavioral therapy: Modify fluid intake Decreasing bladder irritants (such as caffeine) Urge suppression strategies Bladder retraining / timed voiding Double voiding Medication(s): - For anticholinergic medications, we discussed the potential side effects of anticholinergics including dry eyes, dry mouth, constipation, cognitive impairment and urinary retention.  - For beta-3 agonist medication, we discussed the risk for urinary retention and the potential side effect of elevated blood pressure specific to Myrbetriq (which is more likely to occur in individuals with uncontrolled hypertension).  For refractory cases: PTNS (posterior tibial nerve stimulation) Sacral neuromodulation trial (Medtronic lnterStim or Axonics implant) Bladder Botox injections In extreme cases, SP tube placement  She decided to proceed with Vesicare 10 mg daily and to work on behavioral modifications including minimizing caffeine intake and working on timed voiding / bladder retraining.  Requesting prior records from [facility], including urodynamic testing results.  Will plan for follow up in 8 weeks or sooner if needed. Pt verbalized understanding and agreement. All questions were answered.   PLAN Advised the following: Start Vesicare 10 mg daily. Minimize caffeine intake. Work on timed voiding / bladder retraining. Urge suppression strategies. Requesting prior records from [facility]. Return in about 8 weeks (around 01/23/2023) for UA, PVR, & f/u with Evette Georges NP.  Orders Placed This Encounter  Procedures   Urinalysis, dipstick only   Urinalysis, Routine w reflex microscopic   BLADDER SCAN AMB NON-IMAGING    It has been explained that the patient is to follow regularly with their PCP in addition to all other providers  involved in their care and to follow instructions provided by these respective offices. Patient advised to contact urology clinic if any urologic-pertaining questions, concerns, new symptoms or problems arise in the interim period.  Patient Instructions              Overactive bladder (OAB) overview for patients:  Symptoms may include: urinary urgency ("gotta go"  feeling) urinary frequency (voiding >8 times per day) night time urination (nocturia) urge incontinence of urine (UUI)  While we do not know the exact etiology of OAB, several treatment options exist including:  Behavioral therapy: Reducing fluid intake Decreasing bladder stimulants (such as caffeine) and irritants (such as acidic food, spicy foods, alcohol) Urge suppression strategies Bladder retraining via timed voiding  Pelvic floor physical therapy  Medication(s) - can use one or both of the drug classes below. Anticholinergic / antimuscarinic medications:  Mechanism of action: Activate M3 receptors to reduce detrusor stimulation and increase bladder capacity   (parasympathetic nervous system). Effect: Relaxes the bladder to decrease overactivity, increase bladder storage capacity, and increase time between voids. Onset: Slow acting (may take 8-12 weeks to determine efficacy). Medications include: Vesicare (Solifenacin), Ditropan (Oxybutynin), Detrol (Tolterodine), Toviaz (Fesoterodine), Sanctura (Trospium), Urispas (Flavoxate), Enablex (Darifenacin), Bentyl (Dicyclomine), Levsin (Hyoscyamine ). Potential side effects include but are not limited to: Dry eyes, dry mouth, constipation, cognitive impairment, dementia risk with long term use, and urinary retention/ incomplete bladder emptying. Insurance companies generally prefer for patients to try 1-2 anticholinergic / antimuscarinic medications first due to low cost. Some exceptions are made based on patient-specific comorbidities / risk factors. Beta-3 agonist  medications: Mechanism of action: Stimulates selective B3 adrenergic receptors to cause smooth muscle bladder relaxation (sympathetic nervous system). Effect: Relaxes the bladder to decrease overactivity, increase bladder storage capacity, and increase time between voids. Onset: Slow acting (may take 8-12 weeks to determine efficacy). Medications include: Myrbetriq (Mirabegron) and Vibegron Leslye Peer). Potential side effects include but are not limited to: urinary retention / incomplete bladder emptying and elevated blood pressure (more likely to occur in individuals with pre-existing uncontrolled hypertension). These medications tend to be more expensive than the anticholinergic / antimuscarinic medications.   For patients with refractory OAB (if the above treatment options have been unsuccessful): Posterior tibial nerve stimulation (PTNS). Small acupuncture-type needle inserted near ankle with electric current to stimulate bladder via posterior tibial nerve pathway. Initially requires 12 weekly in-office treatments lasting 30 minutes each; followed by monthly in-office treatments lasting 30 minutes each for 1 year.  Bladder Botox injections. How it is done: Typically done via in-office cystoscopy; sometimes done in the OR depending on the situation. The bladder is numbed with lidocaine instilled via a catheter. Then the urologist injects Botox into the bladder muscle wall in about 20 locations. Causes local paralysis of the bladder muscle at the injection sites to reduce bladder muscle overactivity / spasms. The effect lasts for approximately 6 months and cannot be reversed once performed. Risks may included but are not limited to: infection, incomplete bladder emptying/ urinary retention, short term need for self-catheterization or indwelling catheter, and need for repeat therapy. There is a 5-12% chance of needing to catheterize with Botox - that usually resolves in a few months as the Botox wears  off. Typically Botox injections would need to be repeated every 3-12 months since this is not a permanent therapy.  Sacral neuromodulation trial (Medtronic lnterStim or Axonics implant). Sacral neuromodulation is FDA-approved for uncontrolled urinary urgency, urinary frequency, urinary urge incontinence, non-obstructive urinary retention, or fecal incontinence. It is not FDA-approved as a treatment for pain. The goal of this therapy is at least a 50% improvement in symptoms. It is NOT realistic to expect a 100% cure. This is a a 2-step outpatient procedure. After a successful test period, a permanent wire and generator are placed in the OR. We discussed the risk of infection. We reviewed the  fact that about 30% of patients fail the test phase and are not candidates for permanent generator placement. During the 1-2 week trial phase, symptoms are documented by the patient to determine response. If patient gets at least a 50% improvement in symptoms, they may then proceed with Step 2. Step 1: Trial lead placement. Per physician discretion, may done one of two ways: Percutaneous nerve evaluation (PNE) in the [facility] urology office. Performed by urologist under local anesthesia (numbing the area with lidocaine) using a spinal needle for placement of test wire, which usually stays in place for 5-7 days to determine therapy response. Test lead placement in OR under anesthesia. Usually stays in place 2 weeks to determine therapy response. > Step 2: Permanent implantation of sacral neuromodulation device, which is performed in the OR.  Sacral neuromodulation implants: All are conditionally MRI safe. Manufacturer: Medtronic Website: [employer].gl therapy/right-for-you.html Options: lnterStim X: Non-rechargeable. The battery lasts 10 years on average. lnterStim Micro: Rechargeable. The battery lasts 15 years on average and must  be charged routinely. Approximately 50% smaller implant than lnterStim X implant.  Manufacturer: Axonics Website: Findrealrelief.axonics.com Options: Non-rechargeable (Axonics F15): The battery lasts 15 years on average. Rechargeable (Axonics R20): The battery lasts 20 years on average and must be charged in office for about 1 hour every 6-10 months on average. Approximately 50% smaller implant than Axonics non-rechargeable implant.  Note: Generally the rechargeable devices are only advised for very small or thin patients who may not have sufficient adipose tissue to comfortably overlay the implanted device.  Suprapubic catheter (SP tube) placement. Only done in severely refractory OAB when all other options have failed or are not a viable treatment choice depending on patient factors. Involves placement of a catheter through the lower abdomen into the bladder to continuously drain the bladder into an external collection bag, which patient can then empty at their convenience every few hours. Done via an outpatient surgical procedure in the OR under anesthesia. Risks may included but are not limited to: surgical site pain, infections, skin irritation / breakdown, chronic bacteriuria, symptomatic UTls. The SP tube must stay in place continuously. This is a reversible procedure however - the insertion site will close if catheter is removed for more than a few hours. The SP tube must be exchanged routinely every 4 weeks to prevent the catheter from becoming clogged with sediment. SP tube exchanges are typically performed at a urology nurse visit or by a home health nurse.   Electronically signed by:  Donnita Falls, MSN, FNP-C, CUNP 11/28/2022 10:49 AM

## 2022-11-18 ENCOUNTER — Ambulatory Visit: Payer: 59 | Admitting: Urology

## 2022-11-18 DIAGNOSIS — N3942 Incontinence without sensory awareness: Secondary | ICD-10-CM

## 2022-11-18 DIAGNOSIS — N3010 Interstitial cystitis (chronic) without hematuria: Secondary | ICD-10-CM

## 2022-11-18 DIAGNOSIS — N3941 Urge incontinence: Secondary | ICD-10-CM

## 2022-11-23 ENCOUNTER — Other Ambulatory Visit: Payer: Self-pay

## 2022-11-23 ENCOUNTER — Other Ambulatory Visit (HOSPITAL_COMMUNITY): Payer: Self-pay

## 2022-11-28 ENCOUNTER — Other Ambulatory Visit (HOSPITAL_COMMUNITY): Payer: Self-pay

## 2022-11-28 ENCOUNTER — Ambulatory Visit: Payer: 59 | Admitting: Urology

## 2022-11-28 ENCOUNTER — Encounter: Payer: Self-pay | Admitting: Urology

## 2022-11-28 VITALS — BP 95/63 | HR 97 | Temp 98.1°F

## 2022-11-28 DIAGNOSIS — N3946 Mixed incontinence: Secondary | ICD-10-CM

## 2022-11-28 DIAGNOSIS — Z1501 Genetic susceptibility to malignant neoplasm of breast: Secondary | ICD-10-CM | POA: Insufficient documentation

## 2022-11-28 DIAGNOSIS — N3281 Overactive bladder: Secondary | ICD-10-CM

## 2022-11-28 DIAGNOSIS — N393 Stress incontinence (female) (male): Secondary | ICD-10-CM

## 2022-11-28 DIAGNOSIS — N3010 Interstitial cystitis (chronic) without hematuria: Secondary | ICD-10-CM

## 2022-11-28 DIAGNOSIS — R102 Pelvic and perineal pain: Secondary | ICD-10-CM

## 2022-11-28 DIAGNOSIS — N3942 Incontinence without sensory awareness: Secondary | ICD-10-CM

## 2022-11-28 DIAGNOSIS — N3941 Urge incontinence: Secondary | ICD-10-CM

## 2022-11-28 LAB — URINALYSIS, DIPSTICK ONLY
Blood, UA: NEGATIVE
Glucose: 2
LEUKOCYTES: NEGATIVE
Nitrite: NEGATIVE
Protein: NEGATIVE

## 2022-11-28 LAB — BLADDER SCAN AMB NON-IMAGING: Scan Result: 6

## 2022-11-28 MED ORDER — SOLIFENACIN SUCCINATE 10 MG PO TABS
10.0000 mg | ORAL_TABLET | Freq: Every day | ORAL | 5 refills | Status: DC
  Filled 2022-11-28: qty 30, 30d supply, fill #0

## 2022-11-28 NOTE — Patient Instructions (Signed)
Overactive bladder (OAB) overview for patients:  Symptoms may include: urinary urgency ("gotta go" feeling) urinary frequency (voiding >8 times per day) night time urination (nocturia) urge incontinence of urine (UUI)  While we do not know the exact etiology of OAB, several treatment options exist including:  Behavioral therapy: Reducing fluid intake Decreasing bladder stimulants (such as caffeine) and irritants (such as acidic food, spicy foods, alcohol) Urge suppression strategies Bladder retraining via timed voiding  Pelvic floor physical therapy  Medication(s) - can use one or both of the drug classes below. Anticholinergic / antimuscarinic medications:  Mechanism of action: Activate M3 receptors to reduce detrusor stimulation and increase bladder capacity  (parasympathetic nervous system). Effect: Relaxes the bladder to decrease overactivity, increase bladder storage capacity, and increase time between voids. Onset: Slow acting (may take 8-12 weeks to determine efficacy). Medications include: Vesicare (Solifenacin), Ditropan (Oxybutynin), Detrol (Tolterodine), Toviaz (Fesoterodine), Sanctura (Trospium), Urispas (Flavoxate), Enablex (Darifenacin), Bentyl (Dicyclomine), Levsin (Hyoscyamine ). Potential side effects include but are not limited to: Dry eyes, dry mouth, constipation, cognitive impairment, dementia risk with long term use, and urinary retention/ incomplete bladder emptying. Insurance companies generally prefer for patients to try 1-2 anticholinergic / antimuscarinic medications first due to low cost. Some exceptions are made based on patient-specific comorbidities / risk factors. Beta-3 agonist medications: Mechanism of action: Stimulates selective B3 adrenergic receptors to cause smooth muscle bladder relaxation (sympathetic nervous system). Effect: Relaxes the bladder to decrease overactivity, increase bladder storage capacity, and increase time  between voids. Onset: Slow acting (may take 8-12 weeks to determine efficacy). Medications include: Myrbetriq (Mirabegron) and Vibegron Leslye Peer). Potential side effects include but are not limited to: urinary retention / incomplete bladder emptying and elevated blood pressure (more likely to occur in individuals with pre-existing uncontrolled hypertension). These medications tend to be more expensive than the anticholinergic / antimuscarinic medications.   For patients with refractory OAB (if the above treatment options have been unsuccessful): Posterior tibial nerve stimulation (PTNS). Small acupuncture-type needle inserted near ankle with electric current to stimulate bladder via posterior tibial nerve pathway. Initially requires 12 weekly in-office treatments lasting 30 minutes each; followed by monthly in-office treatments lasting 30 minutes each for 1 year.  Bladder Botox injections. How it is done: Typically done via in-office cystoscopy; sometimes done in the OR depending on the situation. The bladder is numbed with lidocaine instilled via a catheter. Then the urologist injects Botox into the bladder muscle wall in about 20 locations. Causes local paralysis of the bladder muscle at the injection sites to reduce bladder muscle overactivity / spasms. The effect lasts for approximately 6 months and cannot be reversed once performed. Risks may included but are not limited to: infection, incomplete bladder emptying/ urinary retention, short term need for self-catheterization or indwelling catheter, and need for repeat therapy. There is a 5-12% chance of needing to catheterize with Botox - that usually resolves in a few months as the Botox wears off. Typically Botox injections would need to be repeated every 3-12 months since this is not a permanent therapy.  Sacral neuromodulation trial (Medtronic lnterStim or Axonics implant). Sacral neuromodulation is FDA-approved for uncontrolled urinary  urgency, urinary frequency, urinary urge incontinence, non-obstructive urinary retention, or fecal incontinence. It is not FDA-approved as a treatment for pain. The goal of this therapy is at least a 50% improvement in symptoms. It is NOT realistic to expect a 100% cure. This is a a 2-step outpatient procedure. After a successful  test period, a permanent wire and generator are placed in the OR. We discussed the risk of infection. We reviewed the fact that about 30% of patients fail the test phase and are not candidates for permanent generator placement. During the 1-2 week trial phase, symptoms are documented by the patient to determine response. If patient gets at least a 50% improvement in symptoms, they may then proceed with Step 2. Step 1: Trial lead placement. Per physician discretion, may done one of two ways: Percutaneous nerve evaluation (PNE) in the [facility] urology office. Performed by urologist under local anesthesia (numbing the area with lidocaine) using a spinal needle for placement of test wire, which usually stays in place for 5-7 days to determine therapy response. Test lead placement in OR under anesthesia. Usually stays in place 2 weeks to determine therapy response. > Step 2: Permanent implantation of sacral neuromodulation device, which is performed in the OR.  Sacral neuromodulation implants: All are conditionally MRI safe. Manufacturer: Medtronic Website: [employer]er.gl therapy/right-for-you.html Options: lnterStim X: Non-rechargeable. The battery lasts 10 years on average. lnterStim Micro: Rechargeable. The battery lasts 15 years on average and must be charged routinely. Approximately 50% smaller implant than lnterStim X implant.  Manufacturer: Axonics Website: Findrealrelief.axonics.com Options: Non-rechargeable (Axonics F15): The battery lasts 15 years on average. Rechargeable (Axonics R20):  The battery lasts 20 years on average and must be charged in office for about 1 hour every 6-10 months on average. Approximately 50% smaller implant than Axonics non-rechargeable implant.  Note: Generally the rechargeable devices are only advised for very small or thin patients who may not have sufficient adipose tissue to comfortably overlay the implanted device.  Suprapubic catheter (SP tube) placement. Only done in severely refractory OAB when all other options have failed or are not a viable treatment choice depending on patient factors. Involves placement of a catheter through the lower abdomen into the bladder to continuously drain the bladder into an external collection bag, which patient can then empty at their convenience every few hours. Done via an outpatient surgical procedure in the OR under anesthesia. Risks may included but are not limited to: surgical site pain, infections, skin irritation / breakdown, chronic bacteriuria, symptomatic UTls. The SP tube must stay in place continuously. This is a reversible procedure however - the insertion site will close if catheter is removed for more than a few hours. The SP tube must be exchanged routinely every 4 weeks to prevent the catheter from becoming clogged with sediment. SP tube exchanges are typically performed at a urology nurse visit or by a home health nurse.

## 2022-11-29 ENCOUNTER — Inpatient Hospital Stay: Payer: 59 | Attending: Hematology

## 2022-11-29 DIAGNOSIS — E611 Iron deficiency: Secondary | ICD-10-CM | POA: Insufficient documentation

## 2022-11-29 DIAGNOSIS — D75839 Thrombocytosis, unspecified: Secondary | ICD-10-CM | POA: Diagnosis not present

## 2022-11-29 DIAGNOSIS — D500 Iron deficiency anemia secondary to blood loss (chronic): Secondary | ICD-10-CM

## 2022-11-29 DIAGNOSIS — D72829 Elevated white blood cell count, unspecified: Secondary | ICD-10-CM | POA: Diagnosis not present

## 2022-11-29 LAB — CBC WITH DIFFERENTIAL/PLATELET
Abs Immature Granulocytes: 0.07 10*3/uL (ref 0.00–0.07)
Basophils Absolute: 0.1 10*3/uL (ref 0.0–0.1)
Basophils Relative: 1 %
Eosinophils Absolute: 0.3 10*3/uL (ref 0.0–0.5)
Eosinophils Relative: 3 %
HCT: 41.3 % (ref 36.0–46.0)
Hemoglobin: 12.8 g/dL (ref 12.0–15.0)
Immature Granulocytes: 1 %
Lymphocytes Relative: 31 %
Lymphs Abs: 3.9 10*3/uL (ref 0.7–4.0)
MCH: 25.9 pg — ABNORMAL LOW (ref 26.0–34.0)
MCHC: 31 g/dL (ref 30.0–36.0)
MCV: 83.6 fL (ref 80.0–100.0)
Monocytes Absolute: 0.7 10*3/uL (ref 0.1–1.0)
Monocytes Relative: 6 %
Neutro Abs: 7.6 10*3/uL (ref 1.7–7.7)
Neutrophils Relative %: 58 %
Platelets: 379 10*3/uL (ref 150–400)
RBC: 4.94 MIL/uL (ref 3.87–5.11)
RDW: 14.5 % (ref 11.5–15.5)
WBC: 12.7 10*3/uL — ABNORMAL HIGH (ref 4.0–10.5)
nRBC: 0 % (ref 0.0–0.2)

## 2022-11-29 LAB — IRON AND TIBC
Iron: 51 ug/dL (ref 28–170)
Saturation Ratios: 12 % (ref 10.4–31.8)
TIBC: 433 ug/dL (ref 250–450)
UIBC: 382 ug/dL

## 2022-11-29 LAB — FERRITIN: Ferritin: 116 ng/mL (ref 11–307)

## 2022-11-30 LAB — URINALYSIS, ROUTINE W REFLEX MICROSCOPIC

## 2022-12-01 NOTE — Telephone Encounter (Signed)
Please see pt message below.  Do you recommend an alternative.

## 2022-12-02 ENCOUNTER — Other Ambulatory Visit (HOSPITAL_COMMUNITY): Payer: Self-pay

## 2022-12-03 DIAGNOSIS — G4733 Obstructive sleep apnea (adult) (pediatric): Secondary | ICD-10-CM | POA: Diagnosis not present

## 2022-12-05 NOTE — Progress Notes (Unsigned)
[facility] [address], [place] [address]   CLINIC:  Medical Oncology/Hematology  PCP:  Alysia Penna, MD [address] [place] [address] [phone]   REASON FOR VISIT:  Follow-up for leukocytosis, thrombocytosis, and iron deficiency   PRIOR THERAPY: None   CURRENT THERAPY: Ferrous sulfate 325 mg daily and intermittent IV iron  INTERVAL HISTORY:   Karen Hoover 32 y.o. female returns for routine follow-up of leukocytosis, thrombocytosis, and iron deficiency.  She was last seen by Rojelio Brenner PA-C on 06/06/2022.  She received Venofer 400 mg x 1 on 06/10/2022, reports feeling better energy after IV iron, but has had some recurrent fatigue over the past month.  At today's visit, she reports feeling fair.  No recent hospitalizations, surgeries, or changes in baseline health status.  She has been taking iron tablet daily without any issues.  She reports that her periods are regular with moderate bleeding - she is taking lo-loestrin to help decrease her bleeding.  She denies any other source of blood loss such as hematemesis, hematochezia, melena, or epistaxis.  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  She feels better energy when she uses her CPAP at night.  She denies any pica, restless legs, headaches, chest pain, dyspnea exertion, lightheadedness, or syncope.  She denies any infections since her last visit; no B symptoms.  She has 70% energy and 85% appetite. She endorses that she is maintaining a stable weight.  ASSESSMENT & PLAN:  1.  Leukocytosis and thrombocytosis: - Nonspecific leukocytosis with intermittently elevated lymphocytes and neutrophils at least since July 2017. - MPN work-up was negative.  (Negative BCR/ABL FISH.  Negative JAK2/CALR/MPL.  Flow cytometry negative for monoclonal B-cell population or abnormal T-cell phenotype.)  LDH  normal.  Inflammatory markers were elevated (CRP 5.7, ESR 28).  Negative ANA and RF. - She is not on any steroids.  No history of splenectomy.  No known connective tissue disease.  Non-smoker. - Denies any fevers.  She has occasional sweats, averaging once per week, waist down, for the past 2 months, during daytime when she sleeps.  She usually works at night.  She has not had any abnormal weight loss. - She denies any recurrent infections.     - Most recent labs (11/29/2022): WBC 12.7/ANC 7.6, platelets 379.  Platelets improved after IV iron supplementation. - PLAN: No concern for malignant leukocytosis or thrombocytosis at this time. - Suspect reactive thrombocytosis from inflammation, obesity, and iron deficiency. - Suspect reactive leukocytosis in the setting of morbid obesity and chronic inflammation. - No further work-up planned at this time, but would consider bone marrow biopsy if any significant deviation from baseline.  2.  Iron deficiency with microcytosis, without anemia: - CBC (12/11/2020) shows normal Hgb 13.0, but microcytosis with MCV 78.6, and elevated RBC 5.37. - Iron panel (12/11/2020) shows iron deficiency with ferritin 17, iron saturation 6%, and TIBC 608.  Folate and B12 were normal. - She has moderately heavy periods that last 2 to 3 days, but these have become more irregular and lighter after starting lo-Loestrin.  She had also given blood 4-5 times per year, but has not given blood since October 2022 - Started ferrous sulfate daily in November 2022, tolerating it well - Received Venofer 400 mg x 1 in May 2024. - Most recent labs (11/29/2022): Hgb 13.2/MCV 82.9, ferritin 116, iron saturation 12% with TIBC 382 -  PLAN:  Venofer 400 mg x 1 due to recurrent fatigue with iron saturation < 20% - Continue oral iron supplementation with ferrous sulfate 325 mg daily, with Colace as needed for constipation - Repeat CBC and iron panel in 6 months, followed by OFFICE visit - Patient  instructed to stop donating blood until her iron deficiency has resolved.   3.  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  Per genetic counselor note: Approximately 48% lifetime risk of breast cancer.  Annual mammogram and breast MRI screening starting at age 58, or 39 years younger than earliest age of onset.  Risk reducing mastectomy could be considered. Approximately 10 to 14% lifetime risk of colon cancer.  Colonoscopy every 5 years starting at age 28. - Patient is not sure when her mother was diagnosed with breast cancer, but thinks that it was "sometime in her 49s" - PLAN: Patient has been provided with copy of genetic counselors report and instructed to follow these recommendations via her PCP. - Patient encouraged to find out specific age of for members diagnosis of cancer, so that she can start screening protocol at appropriate time.  4.  Morbid obesity - Discussed with patient various strategies toward improving her health, particularly focusing on one change at a time to make steps toward healthier lifestyle overall.  Patient is optimistic and would like to make changes to her health.     5.  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  She is a non-smoker. - Mother had breast cancer.  Maternal great grandmother had cancer.   PLAN SUMMARY: >> Venofer 400 mg x 1 >> Labs in 6 months = CBC/D, ferritin, iron/TIBC >> OFFICE visit in 6 months (after labs)     REVIEW OF SYSTEMS:   Review of Systems  Constitutional:  Positive for fatigue. Negative for appetite change, chills, diaphoresis, fever and unexpected weight change.  HENT:   Negative for lump/mass and nosebleeds.   Eyes:  Negative for eye problems.  Respiratory:  Negative for cough, hemoptysis and shortness of breath.   Cardiovascular:  Negative for  chest pain, leg swelling and palpitations.  Gastrointestinal:  Negative for abdominal pain, blood in stool, constipation, diarrhea, nausea and vomiting.  Genitourinary:  Negative for hematuria.   Skin: Negative.   Neurological:  Negative for dizziness, headaches and light-headedness.  Hematological:  Does not bruise/bleed easily.  Psychiatric/Behavioral:  Negative for depression and sleep disturbance. The patient is not nervous/anxious.      PHYSICAL EXAM:  ECOG PERFORMANCE STATUS: 1 - Symptomatic but completely ambulatory  Vitals:   12/06/22 0937  BP: 123/81  Pulse: 85  Temp: 98 F (36.7 C)  SpO2: 98%   Filed Weights   12/06/22 0937  Weight: 264 lb (119.7 kg)   Physical Exam Constitutional:      Appearance: Normal appearance. She is morbidly obese.  Cardiovascular:     Heart sounds: Normal heart sounds.  Pulmonary:     Breath sounds: Normal breath sounds.  Neurological:     General: No focal deficit present.     Mental Status: Mental status is at baseline.  Psychiatric:        Behavior: Behavior normal. Behavior is cooperative.    PAST MEDICAL/SURGICAL HISTORY:  Past Medical History:  Diagnosis Date   Anemia    Anxiety 03/17/2015  BV (bacterial vaginosis) 04/10/2014   Chronic lower back pain    2 herniated disc sees Dr Channing Mutters   Complication of anesthesia    O2 desaturation, Hypotension, Fluid filled lungs   Diabetes mellitus without complication (HCC)    Encounter for menstrual regulation 03/13/2014   Family history of breast cancer    Family history of melanoma    Fatty liver 09/05/2018   Gallstones 09/05/2018   Refer to Dr Henreitta Leber   Hemorrhoids 01/12/2015   History of rectal bleeding 01/12/2015   Hypothyroidism    IC (interstitial cystitis) 01/22/2015   Iron deficiency anemia due to chronic blood loss 06/06/2022   Pulmonary edema 08/21/2015   S/P back OR   Sleep apnea    Thyroid disease    Tonsillitis    Vaginal discharge 03/13/2014   Vaginal itching  01/12/2015   Vestibulitis, vulvar 04/21/2014   Yeast infection 03/13/2014   Past Surgical History:  Procedure Laterality Date   BACK SURGERY     CHOLECYSTECTOMY N/A 10/17/2018   Procedure: LAPAROSCOPIC CHOLECYSTECTOMY;  Surgeon: Lucretia Roers, MD;  Location: [facility];  Service: General;  Laterality: N/A;   LAMINECTOMY AND MICRODISCECTOMY LUMBAR SPINE Right 08/21/2015   L4-5 and L5-S 1    SOCIAL HISTORY:  Social History   Socioeconomic History   Marital status: Single    Spouse name: Not on file   Number of children: Not on file   Years of education: Not on file   Highest education level: Not on file  Occupational History   Occupation: Surgical Instrument cleaner    Employer: [employer]  Tobacco Use   Smoking status: Never   Smokeless tobacco: Never  Vaping Use   Vaping status: Never Used  Substance and Sexual Activity   Alcohol use: Yes    Comment: occasionally    Drug use: No   Sexual activity: Not Currently    Birth control/protection: Condom, Pill  Other Topics Concern   Not on file  Social History Narrative   Not on file   Social Determinants of Health   Financial Resource Strain: Low Risk  (10/27/2022)   Overall Financial Resource Strain (CARDIA)    Difficulty of Paying Living Expenses: Not very hard  Food Insecurity: Food Insecurity Present (10/27/2022)   Hunger Vital Sign    Worried About Radiation protection practitioner of Food in the Last Year: Never true    Ran Out of Food in the Last Year: Sometimes true  Transportation Needs: No Transportation Needs (10/27/2022)   PRAPARE - Administrator, Civil Service (Medical): No    Lack of Transportation (Non-Medical): No  Physical Activity: Insufficiently Active (10/27/2022)   Exercise Vital Sign    Days of Exercise per Week: 1 day    Minutes of Exercise per Session: 30 min  Stress: No Stress Concern Present (10/27/2022)   [employer] of Occupational Health - Occupational Stress Questionnaire    Feeling  of Stress : Only a little  Social Connections: Moderately Integrated (10/27/2022)   Social Connection and Isolation Panel [NHANES]    Frequency of Communication with Friends and Family: More than three times a week    Frequency of Social Gatherings with Friends and Family: Twice a week    Attends Religious Services: More than 4 times per year    Active Member of [employer] or Organizations: Yes    Attends Banker Meetings: 1 to 4 times per year    Marital Status: Never married  Intimate Partner Violence: Not At Risk (10/27/2022)   Humiliation, Afraid, Rape, and Kick questionnaire    Fear of Current or Ex-Partner: No    Emotionally Abused: No    Physically Abused: No    Sexually Abused: No    FAMILY HISTORY:  Family History  Problem Relation Age of Onset   Diabetes Mother    Thyroid disease Mother    Breast cancer Mother 58       CHEK2 pos   Thyroid disease Brother    Diabetes Maternal Grandmother    Melanoma Maternal Grandmother        d. 23   Melanoma Maternal Grandfather        d. 36   Cancer Paternal Grandmother    Breast cancer Cousin 14       mother's pat first cousin   Kidney cancer Other        MGF's sister   Sleep apnea Neg Hx     CURRENT MEDICATIONS:  Outpatient Encounter Medications as of 12/06/2022  Medication Sig   albuterol (PROAIR HFA) 108 (90 Base) MCG/ACT inhaler Inhale 1-2 puffs into the lungs every 4 (four) hours as needed.   aspirin EC 81 MG tablet Take 81 mg by mouth daily.   benzonatate (TESSALON) 100 MG capsule Take 1 capsule (100 mg total) by mouth 2 (two) times daily as needed for cough. (Patient taking differently: Take 100 mg by mouth as needed.)   Blood Glucose Monitoring Suppl (FREESTYLE LITE) w/Device KIT Use as directed to test 4 times daily   cetirizine (ZYRTEC ALLERGY) 10 MG tablet Take 1 tablet (10 mg total) by mouth daily. (Patient taking differently: Take 10 mg by mouth.)   cholestyramine (QUESTRAN) 4 g packet Take 1 packet mixed  with water or non-carbonated drink by mouth three times a day.   Continuous Glucose Sensor (FREESTYLE LIBRE 3 SENSOR) MISC Change every 14 days to monitor blood glucose continously   Cyanocobalamin (VITAMIN B-12) 1000 MCG SUBL Place 1 tablet under the tongue.   Dulaglutide (TRULICITY) 1.5 MG/0.5ML SOPN Inject 1.5 mg into the skin once a week.   Empagliflozin-metFORMIN HCl ER (SYNJARDY XR) 25-1000 MG TB24 Take 1 tablet by mouth daily with breakfast.   Empagliflozin-metFORMIN HCl ER (SYNJARDY XR) 25-1000 MG TB24 Take 1 tablet by mouth daily with breakfast.   escitalopram (LEXAPRO) 20 MG tablet Take 1 tablet (20 mg total) by mouth daily.   Ferrous Sulfate (IRON PO) Take 1 tablet by mouth daily.   fluticasone (FLONASE) 50 MCG/ACT nasal spray Place 2 sprays into both nostrils daily.   gabapentin (NEURONTIN) 300 MG capsule Take 300 mg by mouth daily as needed (pain).   glucose blood (FREESTYLE LITE) test strip Use to test blood sugar 4 times daily   glucose blood test strip Use to test 2 times daily.   guaiFENesin-codeine (ROBITUSSIN AC) 100-10 MG/5ML syrup Take 5 mLs by mouth 3 (three) times daily as needed for cough. (Patient taking differently: Take 5 mLs by mouth as needed for cough.)   Ibuprofen 200 MG CAPS Take 800 mg by mouth every 8 (eight) hours as needed for moderate pain.    Lancets (FREESTYLE) lancets Use to test blood sugar 4 times daily   levothyroxine (SYNTHROID) 100 MCG tablet Take 1 tablet (100 mcg total) by mouth in the morning on an empty stomach   methocarbamol (ROBAXIN) 500 MG tablet Take 1 tablet (500 mg total) by mouth every 8 (eight) hours.   Multiple Vitamin (MULTIVITAMIN WITH MINERALS)  TABS tablet Take 1 tablet by mouth daily. OTC   naproxen (NAPROSYN) 500 MG tablet Take 1 tablet (500 mg total) by mouth 2 (two) times daily with a meal.   norethindrone-ethinyl estradiol (LOESTRIN) 1-20 MG-MCG tablet Take 1 tablet by mouth daily, continuously, no inert pills   OneTouch Delica  Lancets 33G MISC Use to self monitor blood glucose twice daily (DX: ICD10: R73.01)   solifenacin (VESICARE) 10 MG tablet Take 1 tablet (10 mg total) by mouth daily.   Zinc Sulfate (ZINC 15 PO) Take by mouth. OTC   No facility-administered encounter medications on file as of 12/06/2022.    ALLERGIES:  Allergies  Allergen Reactions   Metformin Hcl Er Other (See Comments)    diarrhea    LABORATORY DATA:  I have reviewed the labs as listed.  CBC    Component Value Date/Time   WBC 12.7 (H) 11/29/2022 0842   RBC 4.94 11/29/2022 0842   HGB 12.8 11/29/2022 0842   HCT 41.3 11/29/2022 0842   PLT 379 11/29/2022 0842   MCV 83.6 11/29/2022 0842   MCH 25.9 (L) 11/29/2022 0842   MCHC 31.0 11/29/2022 0842   RDW 14.5 11/29/2022 0842   LYMPHSABS 3.9 11/29/2022 0842   MONOABS 0.7 11/29/2022 0842   EOSABS 0.3 11/29/2022 0842   BASOSABS 0.1 11/29/2022 0842      Latest Ref Rng & Units 11/28/2022   12:00 AM 06/01/2020    6:37 PM 10/16/2018   10:24 AM  CMP  Glucose 70 - 99 mg/dL  409  811   BUN 6 - 20 mg/dL  15  11   Creatinine 9.14 - 1.00 mg/dL  7.82  9.56   Sodium 213 - 145 mmol/L  133  136   Potassium 3.5 - 5.1 mmol/L  4.7  4.5   Chloride 98 - 111 mmol/L  101  103   CO2 22 - 32 mmol/L  20  23   Calcium 8.9 - 10.3 mg/dL  9.4  9.1   Total Protein 6.5 - 8.1 g/dL  7.6  7.1   Total Bilirubin 0.3 - 1.2 mg/dL  0.5  0.7   Alkaline Phos 38 - 126 U/L  68  49   AST 15 - 41 U/L  41  20   ALT  1.005-1.030  41  24     DIAGNOSTIC IMAGING:  I have independently reviewed the relevant imaging and discussed with the patient.   WRAP UP:  All questions were answered. The patient knows to call the clinic with any problems, questions or concerns.  Medical decision making: Moderate  Time spent on visit: I spent 20 minutes counseling the patient face to face. The total time spent in the appointment was 30 minutes and more than 50% was on counseling.  Carnella Guadalajara, PA-C  12/06/22 10:28 AM

## 2022-12-06 ENCOUNTER — Other Ambulatory Visit: Payer: Self-pay | Admitting: Urology

## 2022-12-06 ENCOUNTER — Other Ambulatory Visit (HOSPITAL_COMMUNITY): Payer: Self-pay

## 2022-12-06 ENCOUNTER — Inpatient Hospital Stay (HOSPITAL_BASED_OUTPATIENT_CLINIC_OR_DEPARTMENT_OTHER): Payer: 59 | Admitting: Physician Assistant

## 2022-12-06 VITALS — BP 123/81 | HR 85 | Temp 98.0°F | Ht 65.0 in | Wt 264.0 lb

## 2022-12-06 DIAGNOSIS — E611 Iron deficiency: Secondary | ICD-10-CM | POA: Diagnosis not present

## 2022-12-06 DIAGNOSIS — D500 Iron deficiency anemia secondary to blood loss (chronic): Secondary | ICD-10-CM

## 2022-12-06 DIAGNOSIS — D72829 Elevated white blood cell count, unspecified: Secondary | ICD-10-CM | POA: Diagnosis not present

## 2022-12-06 DIAGNOSIS — N3281 Overactive bladder: Secondary | ICD-10-CM

## 2022-12-06 DIAGNOSIS — D75839 Thrombocytosis, unspecified: Secondary | ICD-10-CM | POA: Diagnosis not present

## 2022-12-06 DIAGNOSIS — D75838 Other thrombocytosis: Secondary | ICD-10-CM | POA: Diagnosis not present

## 2022-12-06 MED ORDER — GEMTESA 75 MG PO TABS: 1.0000 | ORAL_TABLET | Freq: Every day | ORAL | 11 refills | Status: DC

## 2022-12-06 NOTE — Patient Instructions (Signed)
[facility] at [facility] **VISIT SUMMARY & IMPORTANT INSTRUCTIONS **   You were seen today by Rojelio Brenner PA-C for your iron deficiency anemia.   Your hemoglobin and red blood cells are normal, but your iron level is slightly low. We will give you IV iron with Venofer 400 mg x 1. You should still continue to take iron pill once daily.  Your white blood cells and platelets remain mildly elevated but stable.  FOLLOW-UP APPOINTMENT: Office visit in 6 months, with labs the week before   ** Thank you for trusting me with your healthcare!  I strive to provide all of my patients with quality care at each visit.  If you receive a survey for this visit, I would be so grateful to you for taking the time to provide feedback.  Thank you in advance!  ~ Nguyet Mercer                   Dr. Doreatha Massed   &   Rojelio Brenner, PA-C   - - - - - - - - - - - - - - - - - -    Thank you for choosing [facility] at [facility] to provide your oncology and hematology care.  To afford each patient quality time with our provider, please arrive at least 15 minutes before your scheduled appointment time.   If you have a lab appointment with the Cancer Center please come in thru the Main Entrance and check in at the main information desk.  You need to re-schedule your appointment should you arrive 10 or more minutes late.  We strive to give you quality time with our providers, and arriving late affects you and other patients whose appointments are after yours.  Also, if you no show three or more times for appointments you may be dismissed from the clinic at the providers discretion.     Again, thank you for choosing [facility].  Our hope is that these requests will decrease the amount of time that you wait before being seen by our physicians.       _____________________________________________________________  Should you have questions after your  visit to [facility], please contact our office at [phone] and follow the prompts.  Our office hours are 8:00 a.m. and 4:30 p.m. Monday - Friday.  Please note that voicemails left after 4:00 p.m. may not be returned until the following business day.  We are closed weekends and major holidays.  You do have access to a nurse 24-7, just call the main number to the clinic [phone] and do not press any options, hold on the line and a nurse will answer the phone.    For prescription refill requests, have your pharmacy contact our office and allow 72 hours.

## 2022-12-07 ENCOUNTER — Other Ambulatory Visit (HOSPITAL_COMMUNITY): Payer: Self-pay

## 2022-12-07 MED ORDER — TRULICITY 1.5 MG/0.5ML ~~LOC~~ SOAJ
1.5000 mg | SUBCUTANEOUS | 6 refills | Status: DC
  Filled 2022-12-07: qty 2, 28d supply, fill #0
  Filled 2022-12-27 – 2022-12-30 (×2): qty 2, 28d supply, fill #1
  Filled 2023-01-29: qty 2, 28d supply, fill #2
  Filled 2023-02-26: qty 2, 28d supply, fill #3
  Filled 2023-03-25: qty 2, 28d supply, fill #4
  Filled 2023-04-29: qty 2, 28d supply, fill #5
  Filled 2023-05-31: qty 2, 28d supply, fill #6

## 2022-12-08 ENCOUNTER — Other Ambulatory Visit (HOSPITAL_COMMUNITY): Payer: Self-pay

## 2022-12-09 ENCOUNTER — Telehealth: Payer: Self-pay

## 2022-12-09 NOTE — Telephone Encounter (Signed)
PA started for Gemtesa Key: [id number]

## 2022-12-15 ENCOUNTER — Inpatient Hospital Stay: Payer: 59 | Attending: Physician Assistant

## 2022-12-15 ENCOUNTER — Other Ambulatory Visit: Payer: Self-pay | Admitting: Urology

## 2022-12-15 VITALS — BP 139/84 | HR 96 | Temp 97.7°F | Resp 20

## 2022-12-15 DIAGNOSIS — E611 Iron deficiency: Secondary | ICD-10-CM | POA: Insufficient documentation

## 2022-12-15 DIAGNOSIS — D72829 Elevated white blood cell count, unspecified: Secondary | ICD-10-CM | POA: Diagnosis not present

## 2022-12-15 DIAGNOSIS — D75839 Thrombocytosis, unspecified: Secondary | ICD-10-CM | POA: Diagnosis not present

## 2022-12-15 DIAGNOSIS — N3281 Overactive bladder: Secondary | ICD-10-CM

## 2022-12-15 DIAGNOSIS — D500 Iron deficiency anemia secondary to blood loss (chronic): Secondary | ICD-10-CM

## 2022-12-15 MED ORDER — OXYBUTYNIN CHLORIDE ER 10 MG PO TB24: 10.0000 mg | ORAL_TABLET | Freq: Every day | ORAL | 1 refills | Status: DC

## 2022-12-15 MED ORDER — SODIUM CHLORIDE 0.9 % IV SOLN: INTRAVENOUS | Status: DC

## 2022-12-15 MED ORDER — CETIRIZINE HCL 10 MG PO TABS
10.0000 mg | ORAL_TABLET | Freq: Once | ORAL | Status: AC
Start: 2022-12-15 — End: 2022-12-15
  Administered 2022-12-15: 10 mg via ORAL
  Filled 2022-12-15: qty 1

## 2022-12-15 MED ORDER — SODIUM CHLORIDE 0.9 % IV SOLN
400.0000 mg | Freq: Once | INTRAVENOUS | Status: AC
  Administered 2022-12-15: 400 mg via INTRAVENOUS
  Filled 2022-12-15: qty 400

## 2022-12-15 MED ORDER — ACETAMINOPHEN 325 MG PO TABS
650.0000 mg | ORAL_TABLET | Freq: Once | ORAL | Status: AC
  Administered 2022-12-15: 650 mg via ORAL
  Filled 2022-12-15: qty 2

## 2022-12-15 MED ORDER — SODIUM CHLORIDE 0.9% FLUSH: 10.0000 mL | Freq: Two times a day (BID) | INTRAVENOUS | Status: DC

## 2022-12-15 NOTE — Progress Notes (Signed)
Patient presents today for iron infusion.  Patient is in satisfactory condition with no new complaints voiced.  Vital signs are stable.  We will proceed with infusion per provider orders. 

## 2022-12-15 NOTE — Progress Notes (Signed)
Patient tolerated iron infusion well with no complaints voiced.  Patient left ambulatory in stable condition.  Vital signs stable at discharge.  Follow up as scheduled.

## 2022-12-15 NOTE — Patient Instructions (Signed)
[facility] - A DEPT OF [facility]  Discharge Instructions: Thank you for choosing [facility] to provide your oncology and hematology care.  If you have a lab appointment with the Cancer Center - please note that after April 8th, 2024, all labs will be drawn in the cancer center.  You do not have to check in or register with the main entrance as you have in the past but will complete your check-in in the cancer center.  Wear comfortable clothing and clothing appropriate for easy access to any Portacath or PICC line.   We strive to give you quality time with your provider. You may need to reschedule your appointment if you arrive late (15 or more minutes).  Arriving late affects you and other patients whose appointments are after yours.  Also, if you miss three or more appointments without notifying the office, you may be dismissed from the clinic at the provider's discretion.      For prescription refill requests, have your pharmacy contact our office and allow 72 hours for refills to be completed.    Today you received the following:  Venofer.   Iron Sucrose Injection What is this medication? IRON SUCROSE (EYE ern SOO krose) treats low levels of iron (iron deficiency anemia) in people with kidney disease. Iron is a mineral that plays an important role in making red blood cells, which carry oxygen from your lungs to the rest of your body. This medicine may be used for other purposes; ask your health care provider or pharmacist if you have questions. COMMON BRAND NAME(S): Venofer What should I tell my care team before I take this medication? They need to know if you have any of these conditions: Anemia not caused by low iron levels Heart disease High levels of iron in the blood Kidney disease Liver disease An unusual or allergic reaction to iron, other medications, foods, dyes, or preservatives Pregnant or trying to get  pregnant Breastfeeding How should I use this medication? This medication is for infusion into a vein. It is given in a hospital or clinic setting. Talk to your care team about the use of this medication in children. While this medication may be prescribed for children as young as 2 years for selected conditions, precautions do apply. Overdosage: If you think you have taken too much of this medicine contact a poison control center or emergency room at once. NOTE: This medicine is only for you. Do not share this medicine with others. What if I miss a dose? Keep appointments for follow-up doses. It is important not to miss your dose. Call your care team if you are unable to keep an appointment. What may interact with this medication? Do not take this medication with any of the following: Deferoxamine Dimercaprol Other iron products This medication may also interact with the following: Chloramphenicol Deferasirox This list may not describe all possible interactions. Give your health care provider a list of all the medicines, herbs, non-prescription drugs, or dietary supplements you use. Also tell them if you smoke, drink alcohol, or use illegal drugs. Some items may interact with your medicine. What should I watch for while using this medication? Visit your care team regularly. Tell your care team if your symptoms do not start to get better or if they get worse. You may need blood work done while you are taking this medication. You may need to follow a special diet. Talk to your care team. Foods  that contain iron include: whole grains/cereals, dried fruits, beans, or peas, leafy green vegetables, and organ meats (liver, kidney). What side effects may I notice from receiving this medication? Side effects that you should report to your care team as soon as possible: Allergic reactions--skin rash, itching, hives, swelling of the face, lips, tongue, or throat Low blood pressure--dizziness, feeling  faint or lightheaded, blurry vision Shortness of breath Side effects that usually do not require medical attention (report to your care team if they continue or are bothersome): Flushing Headache Joint pain Muscle pain Nausea Pain, redness, or irritation at injection site This list may not describe all possible side effects. Call your doctor for medical advice about side effects. You may report side effects to FDA at 1-800-FDA-1088. Where should I keep my medication? This medication is given in a hospital or clinic. It will not be stored at home. NOTE: This sheet is a summary. It may not cover all possible information. If you have questions about this medicine, talk to your doctor, pharmacist, or health care provider.  2024 Elsevier/Gold Standard (2022-07-01 00:00:00)    To help prevent nausea and vomiting after your treatment, we encourage you to take your nausea medication as directed.  BELOW ARE SYMPTOMS THAT SHOULD BE REPORTED IMMEDIATELY: *FEVER GREATER THAN 100.4 F (38 C) OR HIGHER *CHILLS OR SWEATING *NAUSEA AND VOMITING THAT IS NOT CONTROLLED WITH YOUR NAUSEA MEDICATION *UNUSUAL SHORTNESS OF BREATH *UNUSUAL BRUISING OR BLEEDING *URINARY PROBLEMS (pain or burning when urinating, or frequent urination) *BOWEL PROBLEMS (unusual diarrhea, constipation, pain near the anus) TENDERNESS IN MOUTH AND THROAT WITH OR WITHOUT PRESENCE OF ULCERS (sore throat, sores in mouth, or a toothache) UNUSUAL RASH, SWELLING OR PAIN  UNUSUAL VAGINAL DISCHARGE OR ITCHING   Items with * indicate a potential emergency and should be followed up as soon as possible or go to the Emergency Department if any problems should occur.  Please show the CHEMOTHERAPY ALERT CARD or IMMUNOTHERAPY ALERT CARD at check-in to the Emergency Department and triage nurse.  Should you have questions after your visit or need to cancel or reschedule your appointment, please contact [facility] - A DEPT OF [facility] [phone]  and follow the prompts.  Office hours are 8:00 a.m. to 4:30 p.m. Monday - Friday. Please note that voicemails left after 4:00 p.m. may not be returned until the following business day.  We are closed weekends and major holidays. You have access to a nurse at all times for urgent questions. Please call the main number to the clinic [phone] and follow the prompts.  For any non-urgent questions, you may also contact your provider using MyChart. We now offer e-Visits for anyone 42 and older to request care online for non-urgent symptoms. For details visit [email or web address].   Also download the MyChart app! Go to the app store, search "MyChart", open the app, select [facility], and log in with your MyChart username and password.

## 2022-12-16 ENCOUNTER — Other Ambulatory Visit (HOSPITAL_COMMUNITY): Payer: Self-pay

## 2022-12-19 ENCOUNTER — Other Ambulatory Visit: Payer: Self-pay

## 2022-12-19 ENCOUNTER — Ambulatory Visit: Payer: 59

## 2022-12-19 ENCOUNTER — Other Ambulatory Visit (HOSPITAL_COMMUNITY): Payer: Self-pay

## 2022-12-19 ENCOUNTER — Ambulatory Visit
Admission: EM | Admit: 2022-12-19 | Discharge: 2022-12-19 | Disposition: A | Discharge status: Home / Self Care | Payer: 59 | Attending: Family Medicine | Admitting: Family Medicine

## 2022-12-19 DIAGNOSIS — M25572 Pain in left ankle and joints of left foot: Secondary | ICD-10-CM | POA: Diagnosis not present

## 2022-12-19 DIAGNOSIS — S99912A Unspecified injury of left ankle, initial encounter: Secondary | ICD-10-CM | POA: Diagnosis not present

## 2022-12-19 LAB — POCT URINE PREGNANCY: Preg Test, Ur: NEGATIVE

## 2022-12-19 NOTE — ED Triage Notes (Signed)
Pt c/o left ankle injury, after tripping last weekend. Swelling is present, pt has taken Ibuprofen to help with swelling and discomfort. Pt states she is unable to bear all of her  weight on the left side it causes pain.

## 2022-12-19 NOTE — ED Provider Notes (Signed)
[facility]    CSN: [id number] Arrival date & time: 12/19/22  1101      History   Chief Complaint No chief complaint on file.   HPI Karen Hoover is a 32 y.o. female.    Pt c/o left ankle injury, after tripping last weekend. Swelling is present, pt has taken Ibuprofen to help with swelling and discomfort. Pt states she is unable to bear all of her  weight on the left side it causes pain.         Past Medical History:  Diagnosis Date   Anemia    Anxiety 03/17/2015   BV (bacterial vaginosis) 04/10/2014   Chronic lower back pain    2 herniated disc sees Dr Channing Mutters   Complication of anesthesia    O2 desaturation, Hypotension, Fluid filled lungs   Diabetes mellitus without complication (HCC)    Encounter for menstrual regulation 03/13/2014   Family history of breast cancer    Family history of melanoma    Fatty liver 09/05/2018   Gallstones 09/05/2018   Refer to Dr Henreitta Leber   Hemorrhoids 01/12/2015   History of rectal bleeding 01/12/2015   Hypothyroidism    IC (interstitial cystitis) 01/22/2015   Iron deficiency anemia due to chronic blood loss 06/06/2022   Pulmonary edema 08/21/2015   S/P back OR   Sleep apnea    Thyroid disease    Tonsillitis    Vaginal discharge 03/13/2014   Vaginal itching 01/12/2015   Vestibulitis, vulvar 04/21/2014   Yeast infection 03/13/2014    Patient Active Problem List   Diagnosis Date Noted   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   Family history of breast cancer in mother 10/27/2022   Iron deficiency anemia due to chronic blood loss 06/06/2022   Urinary incontinence without sensory awareness 07/17/2019   Fatty liver 09/05/2018   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   Family history of melanoma    Urge incontinence 03/23/2016   Anxiety 03/17/2015   Hemorrhoids 01/12/2015   History of rectal bleeding 01/12/2015    Past  Surgical History:  Procedure Laterality Date   BACK SURGERY     CHOLECYSTECTOMY N/A 10/17/2018   Procedure: LAPAROSCOPIC CHOLECYSTECTOMY;  Surgeon: Lucretia Roers, MD;  Location: [facility];  Service: General;  Laterality: N/A;   LAMINECTOMY AND MICRODISCECTOMY LUMBAR SPINE Right 08/21/2015   L4-5 and L5-S 1    OB History     Gravida  0   Para  0   Term  0   Preterm  0   AB  0   Living  0      SAB  0   IAB  0   Ectopic  0   Multiple  0   Live Births               Home Medications    Prior to Admission medications   Medication Sig Start Date End Date Taking? Authorizing Provider  albuterol (PROAIR HFA) 108 (90 Base) MCG/ACT inhaler Inhale 1-2 puffs into the lungs every 4 (four) hours as needed. 06/02/22     aspirin EC 81 MG tablet Take 81 mg by mouth daily.    [provider]  benzonatate (TESSALON) 100 MG capsule Take 1 capsule (100 mg total) by mouth 2 (two) times daily as needed for cough. Patient taking differently: Take 100 mg by mouth as needed. 10/20/21  Blood Glucose Monitoring Suppl (FREESTYLE LITE) w/Device KIT Use as directed to test 4 times daily 09/28/20     cetirizine (ZYRTEC ALLERGY) 10 MG tablet Take 1 tablet (10 mg total) by mouth daily. Patient taking differently: Take 10 mg by mouth. 04/24/19   Avegno, Zachery Dakins, FNP  cholestyramine (QUESTRAN) 4 g packet Take 1 packet mixed with water or non-carbonated drink by mouth three times a day. 10/07/22     Continuous Glucose Sensor (FREESTYLE LIBRE 3 SENSOR) MISC Change every 14 days to monitor blood glucose continously 06/27/22     Cyanocobalamin (VITAMIN B-12) 1000 MCG SUBL Place 1 tablet under the tongue.    [provider]  Dulaglutide (TRULICITY) 1.5 MG/0.5ML SOAJ Inject 1.5 mg into the skin once a week. 12/07/22     Empagliflozin-metFORMIN HCl ER (SYNJARDY XR) 25-1000 MG TB24 Take 1 tablet by mouth daily with breakfast. 05/14/20     Empagliflozin-metFORMIN HCl ER (SYNJARDY XR)  25-1000 MG TB24 Take 1 tablet by mouth daily with breakfast. 10/17/22     escitalopram (LEXAPRO) 20 MG tablet Take 1 tablet (20 mg total) by mouth daily. 10/27/22   Adline Potter, NP  Ferrous Sulfate (IRON PO) Take 1 tablet by mouth daily.    [provider]  fluticasone (FLONASE) 50 MCG/ACT nasal spray Place 2 sprays into both nostrils daily. 06/01/20   Joy, Shawn C, PA-C  gabapentin (NEURONTIN) 300 MG capsule Take 300 mg by mouth daily as needed (pain).    [provider]  glucose blood (FREESTYLE LITE) test strip Use to test blood sugar 4 times daily 09/23/20     glucose blood test strip Use to test 2 times daily. 09/23/20   Myna Hidalgo, DO  guaiFENesin-codeine (ROBITUSSIN AC) 100-10 MG/5ML syrup Take 5 mLs by mouth 3 (three) times daily as needed for cough. Patient taking differently: Take 5 mLs by mouth as needed for cough. 05/30/20   Moshe Cipro, FNP  Ibuprofen 200 MG CAPS Take 800 mg by mouth every 8 (eight) hours as needed for moderate pain.     [provider]  Lancets (FREESTYLE) lancets Use to test blood sugar 4 times daily 09/28/20     levothyroxine (SYNTHROID) 100 MCG tablet Take 1 tablet (100 mcg total) by mouth in the morning on an empty stomach 10/17/22     methocarbamol (ROBAXIN) 500 MG tablet Take 1 tablet (500 mg total) by mouth every 8 (eight) hours. 10/01/20     Multiple Vitamin (MULTIVITAMIN WITH MINERALS) TABS tablet Take 1 tablet by mouth daily. OTC    [provider]  naproxen (NAPROSYN) 500 MG tablet Take 1 tablet (500 mg total) by mouth 2 (two) times daily with a meal. 08/16/20   Wallis Bamberg, PA-C  norethindrone-ethinyl estradiol (LOESTRIN) 1-20 MG-MCG tablet Take 1 tablet by mouth daily, continuously, no inert pills 10/27/22   Adline Potter, NP  OneTouch Delica Lancets 33G MISC Use to self monitor blood glucose twice daily (DX: ICD10: R73.01) 09/06/17   [provider]  oxybutynin (DITROPAN-XL) 10 MG 24 hr tablet Take  1 tablet (10 mg total) by mouth daily. 12/15/22   Donnita Falls, FNP  Zinc Sulfate (ZINC 15 PO) Take by mouth. OTC    [provider]    Family History Family History  Problem Relation Age of Onset   Diabetes Mother    Thyroid disease Mother    Breast cancer Mother 30       CHEK2 pos   Thyroid  disease Brother    Diabetes Maternal Grandmother    Melanoma Maternal Grandmother        d. 95   Melanoma Maternal Grandfather        d. 73   Cancer Paternal Grandmother    Breast cancer Cousin 50       mother's pat first cousin   Kidney cancer Other        MGF's sister   Sleep apnea Neg Hx     Social History Social History   Tobacco Use   Smoking status: Never   Smokeless tobacco: Never  Vaping Use   Vaping status: Never Used  Substance Use Topics   Alcohol use: Yes    Comment: occasionally    Drug use: No     Allergies   Metformin hcl er and Vesicare [solifenacin]   Review of Systems Review of Systems PER HPI  Physical Exam Triage Vital Signs ED Triage Vitals  Encounter Vitals Group     BP 12/19/22 1144 121/74     Systolic BP Percentile --      Diastolic BP Percentile --      Pulse Rate 12/19/22 1144 82     Resp 12/19/22 1144 16     Temp 12/19/22 1144 98.5 F (36.9 C)     Temp Source 12/19/22 1144 Oral     SpO2 12/19/22 1144 98 %     Weight --      Height --      Head Circumference --      Peak Flow --      Pain Score 12/19/22 1147 5     Pain Loc --      Pain Education --      Exclude from Growth Chart --    No data found.  Updated Vital Signs BP 121/74 (BP Location: Right Arm)   Pulse 82   Temp 98.5 F (36.9 C) (Oral)   Resp 16   LMP 09/21/2022 Comment: pt has irregular periods due to [facility]  SpO2 98%   Visual Acuity Right Eye Distance:   Left Eye Distance:   Bilateral Distance:    Right Eye Near:   Left Eye Near:    Bilateral Near:     Physical Exam Vitals and nursing note reviewed.  Constitutional:      Appearance: Normal  appearance. She is not ill-appearing.  HENT:     Head: Atraumatic.  Eyes:     Extraocular Movements: Extraocular movements intact.     Conjunctiva/sclera: Conjunctivae normal.  Cardiovascular:     Rate and Rhythm: Normal rate.  Pulmonary:     Effort: Pulmonary effort is normal.  Musculoskeletal:        General: Swelling, tenderness and signs of injury present. No deformity. Normal range of motion.     Cervical back: Normal range of motion and neck supple.     Comments: Left lateral ankle edematous, mildly tender to palpation diffusely.  No point tenderness, bony forming palpable.  Range of motion intact but unable to bear weight due to pain  Skin:    General: Skin is warm and dry.     Findings: No bruising or erythema.  Neurological:     Mental Status: She is alert and oriented to person, place, and time.     Comments: Left lower extremity neurovascularly intact  Psychiatric:        Mood and Affect: Mood normal.        Thought Content: Thought content normal.  Judgment: Judgment normal.    UC Treatments / Results  Labs (all labs ordered are listed, but only abnormal results are displayed) Labs Reviewed  POCT URINE PREGNANCY    EKG   Radiology DG Ankle Complete Left  Result Date: 12/19/2022 CLINICAL DATA:  Left ankle injury. EXAM: LEFT ANKLE COMPLETE - 3+ VIEW COMPARISON:  None Available. FINDINGS: There is no evidence of fracture, dislocation, or joint effusion. There is no evidence of arthropathy or other focal bone abnormality. Soft tissues are unremarkable. IMPRESSION: Negative. Electronically Signed   By: Obie Dredge M.D.   On: 12/19/2022 15:35    Procedures Procedures (including critical care time)  Medications Ordered in UC Medications - No data to display  Initial Impression / Assessment and Plan / UC Course  I have reviewed the triage vital signs and the nursing notes.  Pertinent labs & imaging results that were available during my care of the  patient were reviewed by me and considered in my medical decision making (see chart for details).     X-ray of the left ankle negative for acute bony abnormality.  Discussed RICE but call, over-the-counter pain relievers.  Ace wrap applied.  Return for worsening symptoms.  Light duty note given for work.  Final Clinical Impressions(s) / UC Diagnoses   Final diagnoses:  Acute left ankle pain     Discharge Instructions      You may ice, elevate and take over-the-counter pain relievers as needed.  Keep a compression wrap on and try to stay off your feet is much as possible.  We will call if anything comes back abnormal on your x-ray.    ED Prescriptions   None    PDMP not reviewed this encounter.   Particia Nearing, [place] 12/19/22 [phone]

## 2022-12-19 NOTE — Discharge Instructions (Signed)
You may ice, elevate and take over-the-counter pain relievers as needed.  Keep a compression wrap on and try to stay off your feet is much as possible.  We will call if anything comes back abnormal on your x-ray.

## 2022-12-26 DIAGNOSIS — R7989 Other specified abnormal findings of blood chemistry: Secondary | ICD-10-CM | POA: Diagnosis not present

## 2022-12-26 DIAGNOSIS — D509 Iron deficiency anemia, unspecified: Secondary | ICD-10-CM | POA: Diagnosis not present

## 2022-12-26 DIAGNOSIS — E538 Deficiency of other specified B group vitamins: Secondary | ICD-10-CM | POA: Diagnosis not present

## 2022-12-26 DIAGNOSIS — Z79899 Other long term (current) drug therapy: Secondary | ICD-10-CM | POA: Diagnosis not present

## 2022-12-26 DIAGNOSIS — E039 Hypothyroidism, unspecified: Secondary | ICD-10-CM | POA: Diagnosis not present

## 2022-12-26 DIAGNOSIS — E1169 Type 2 diabetes mellitus with other specified complication: Secondary | ICD-10-CM | POA: Diagnosis not present

## 2022-12-27 ENCOUNTER — Other Ambulatory Visit (HOSPITAL_COMMUNITY): Payer: Self-pay

## 2023-01-02 ENCOUNTER — Other Ambulatory Visit (HOSPITAL_COMMUNITY): Payer: Self-pay

## 2023-01-02 DIAGNOSIS — J3501 Chronic tonsillitis: Secondary | ICD-10-CM | POA: Diagnosis not present

## 2023-01-02 DIAGNOSIS — K9089 Other intestinal malabsorption: Secondary | ICD-10-CM | POA: Diagnosis not present

## 2023-01-02 DIAGNOSIS — Z0000 Encounter for general adult medical examination without abnormal findings: Secondary | ICD-10-CM | POA: Diagnosis not present

## 2023-01-02 DIAGNOSIS — E039 Hypothyroidism, unspecified: Secondary | ICD-10-CM | POA: Diagnosis not present

## 2023-01-02 DIAGNOSIS — D509 Iron deficiency anemia, unspecified: Secondary | ICD-10-CM | POA: Diagnosis not present

## 2023-01-02 DIAGNOSIS — D473 Essential (hemorrhagic) thrombocythemia: Secondary | ICD-10-CM | POA: Diagnosis not present

## 2023-01-02 DIAGNOSIS — D72829 Elevated white blood cell count, unspecified: Secondary | ICD-10-CM | POA: Diagnosis not present

## 2023-01-02 DIAGNOSIS — E1169 Type 2 diabetes mellitus with other specified complication: Secondary | ICD-10-CM | POA: Diagnosis not present

## 2023-01-02 DIAGNOSIS — F331 Major depressive disorder, recurrent, moderate: Secondary | ICD-10-CM | POA: Diagnosis not present

## 2023-01-02 MED ORDER — LEVOTHYROXINE SODIUM 112 MCG PO TABS
112.0000 ug | ORAL_TABLET | Freq: Every morning | ORAL | 3 refills | Status: AC
  Filled 2023-01-02 (×2): qty 90, 90d supply, fill #0
  Filled 2023-02-26 – 2023-04-29 (×3): qty 90, 90d supply, fill #1
  Filled 2023-07-15 – 2023-07-17 (×2): qty 90, 90d supply, fill #2
  Filled 2023-10-18: qty 90, 90d supply, fill #3

## 2023-01-03 ENCOUNTER — Other Ambulatory Visit: Payer: Self-pay

## 2023-01-03 DIAGNOSIS — G4733 Obstructive sleep apnea (adult) (pediatric): Secondary | ICD-10-CM | POA: Diagnosis not present

## 2023-01-19 ENCOUNTER — Encounter: Payer: Self-pay | Admitting: Emergency Medicine

## 2023-01-19 ENCOUNTER — Ambulatory Visit
Admission: EM | Admit: 2023-01-19 | Discharge: 2023-01-19 | Disposition: A | Discharge status: Home / Self Care | Payer: 59 | Attending: Nurse Practitioner | Admitting: Nurse Practitioner

## 2023-01-19 ENCOUNTER — Ambulatory Visit: Payer: 59

## 2023-01-19 DIAGNOSIS — M25522 Pain in left elbow: Secondary | ICD-10-CM
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
## 2023-01-19 NOTE — Progress Notes (Unsigned)
Name: Karen Hoover DOB: 03/26/1990 MRN: [id number]  History of Present Illness: [name] is a 32 y.o. female who presents today for follow up visit at [facility] [place].  ***She is accompanied by ***. - GU history: 1. OAB with urinary frequency, nocturia, urgency, and urge incontinence. 2. Stress urinary incontinence. - Previously did pelvic floor physical therapy but states that was not very helpful.   At initial visit on 11/28/2022: - She elected to prioritize OAB management for now and will consider SUI treatment options if needed in the future.  - The plan was: Start Vesicare 10 mg daily. Minimize caffeine intake. Work on timed voiding / bladder retraining. Urge suppression strategies. Requesting prior records from [facility], including prior urodynamic testing results. Return in about 8 weeks (around 01/23/2023) for UA, PVR, & f/u with Evette Georges NP  Since last visit: > 12/06/2022: Vesicare discontinued due to side effect (intolerable dry mouth). Gemtesa prescribed as alternative.   > 12/16/2022: Insurance denied Gemtesa coverage until patient has tried Oxybutynin, therefore Oxybutynin prescription sent today as alternative to [employer].  Today: She {Actions; denies-reports:120008} symptomatic improvement since starting Oxybutynin XL 10 mg daily.  She reports {Blank multiple:19197::"improved","persistent / unchanged"} urinary ***frequency, ***nocturia, ***urgency, and ***urge incontinence. Voiding ***x/day and ***x/night on average. Leaking ***x/day on average; using *** ***pads / ***diapers per day on average.  She {Actions; denies-reports:120008} caffeine intake (*** caffeinated beverages per day on average).  She {Actions; denies-reports:120008} dysuria, gross hematuria, straining to void, or sensations of incomplete emptying.   Fall Screening: Do you usually have a device to assist in your mobility? {yes/no:20286} ***cane / ***walker /  ***wheelchair   Medications: Current Outpatient Medications  Medication Sig Dispense Refill   albuterol (PROAIR HFA) 108 (90 Base) MCG/ACT inhaler Inhale 1-2 puffs into the lungs every 4 (four) hours as needed. 6.7 g 0   aspirin EC 81 MG tablet Take 81 mg by mouth daily.     benzonatate (TESSALON) 100 MG capsule Take 1 capsule (100 mg total) by mouth 2 (two) times daily as needed for cough. (Patient taking differently: Take 100 mg by mouth as needed.) 15 capsule 0   Blood Glucose Monitoring Suppl (FREESTYLE LITE) w/Device KIT Use as directed to test 4 times daily 1 kit 0   cetirizine (ZYRTEC ALLERGY) 10 MG tablet Take 1 tablet (10 mg total) by mouth daily. (Patient taking differently: Take 10 mg by mouth.) 30 tablet 0   cholestyramine (QUESTRAN) 4 g packet Take 1 packet mixed with water or non-carbonated drink by mouth three times a day. 90 packet 1   Continuous Glucose Sensor (FREESTYLE LIBRE 3 SENSOR) MISC Change every 14 days to monitor blood glucose continously 2 each 6   Cyanocobalamin (VITAMIN B-12) 1000 MCG SUBL Place 1 tablet under the tongue.     Dulaglutide (TRULICITY) 1.5 MG/0.5ML SOAJ Inject 1.5 mg into the skin once a week. 2 mL 6   Empagliflozin-metFORMIN HCl ER (SYNJARDY XR) 25-1000 MG TB24 Take 1 tablet by mouth daily with breakfast. 30 tablet 6   Empagliflozin-metFORMIN HCl ER (SYNJARDY XR) 25-1000 MG TB24 Take 1 tablet by mouth daily with breakfast. 90 tablet 3   escitalopram (LEXAPRO) 20 MG tablet Take 1 tablet (20 mg total) by mouth daily. 90 tablet 3   Ferrous Sulfate (IRON PO) Take 1 tablet by mouth daily.     fluticasone (FLONASE) 50 MCG/ACT nasal spray Place 2 sprays into both nostrils daily. 16 g 2   gabapentin (NEURONTIN) 300  MG capsule Take 300 mg by mouth daily as needed (pain).     glucose blood (FREESTYLE LITE) test strip Use to test blood sugar 4 times daily 100 strip 5   glucose blood test strip Use to test 2 times daily. 100 each 3   guaiFENesin-codeine  (ROBITUSSIN AC) 100-10 MG/5ML syrup Take 5 mLs by mouth 3 (three) times daily as needed for cough. (Patient taking differently: Take 5 mLs by mouth as needed for cough.) 120 mL 0   Ibuprofen 200 MG CAPS Take 800 mg by mouth every 8 (eight) hours as needed for moderate pain.      Lancets (FREESTYLE) lancets Use to test blood sugar 4 times daily 400 each 5   levothyroxine (SYNTHROID) 100 MCG tablet Take 1 tablet (100 mcg total) by mouth in the morning on an empty stomach 90 tablet 3   levothyroxine (SYNTHROID) 112 MCG tablet Take 1 tablet (112 mcg total) by mouth every morning on an empty stomach 90 tablet 3   methocarbamol (ROBAXIN) 500 MG tablet Take 1 tablet (500 mg total) by mouth every 8 (eight) hours. 90 tablet 0   Multiple Vitamin (MULTIVITAMIN WITH MINERALS) TABS tablet Take 1 tablet by mouth daily. OTC     naproxen (NAPROSYN) 500 MG tablet Take 1 tablet (500 mg total) by mouth 2 (two) times daily with a meal. 30 tablet 0   norethindrone-ethinyl estradiol (LOESTRIN) 1-20 MG-MCG tablet Take 1 tablet by mouth daily, continuously, no inert pills 84 tablet 4   OneTouch Delica Lancets 33G MISC Use to self monitor blood glucose twice daily (DX: ICD10: R73.01)     oxybutynin (DITROPAN-XL) 10 MG 24 hr tablet Take 1 tablet (10 mg total) by mouth daily. 30 tablet 1   Zinc Sulfate (ZINC 15 PO) Take by mouth. OTC     No current facility-administered medications for this visit.    Allergies: Allergies  Allergen Reactions   Metformin Hcl Er Other (See Comments)    diarrhea   Vesicare [Solifenacin] Other (See Comments)    Intolerable dry mouth    Past Medical History:  Diagnosis Date   Anemia    Anxiety 03/17/2015   BV (bacterial vaginosis) 04/10/2014   Chronic lower back pain    2 herniated disc sees Dr Channing Mutters   Complication of anesthesia    O2 desaturation, Hypotension, Fluid filled lungs   Diabetes mellitus without complication (HCC)    Encounter for menstrual regulation 03/13/2014    Family history of breast cancer    Family history of melanoma    Fatty liver 09/05/2018   Gallstones 09/05/2018   Refer to Dr Henreitta Leber   Hemorrhoids 01/12/2015   History of rectal bleeding 01/12/2015   Hypothyroidism    IC (interstitial cystitis) 01/22/2015   Iron deficiency anemia due to chronic blood loss 06/06/2022   Pulmonary edema 08/21/2015   S/P back OR   Sleep apnea    Thyroid disease    Tonsillitis    Vaginal discharge 03/13/2014   Vaginal itching 01/12/2015   Vestibulitis, vulvar 04/21/2014   Yeast infection 03/13/2014   Past Surgical History:  Procedure Laterality Date   BACK SURGERY     CHOLECYSTECTOMY N/A 10/17/2018   Procedure: LAPAROSCOPIC CHOLECYSTECTOMY;  Surgeon: Lucretia Roers, MD;  Location: [facility];  Service: General;  Laterality: N/A;   LAMINECTOMY AND MICRODISCECTOMY LUMBAR SPINE Right 08/21/2015   L4-5 and L5-S 1   Family History  Problem Relation Age of Onset   Diabetes Mother  Thyroid disease Mother    Breast cancer Mother 59       CHEK2 pos   Thyroid disease Brother    Diabetes Maternal Grandmother    Melanoma Maternal Grandmother        d. 83   Melanoma Maternal Grandfather        d. 23   Cancer Paternal Grandmother    Breast cancer Cousin 48       mother's pat first cousin   Kidney cancer Other        MGF's sister   Sleep apnea Neg Hx    Social History   Socioeconomic History   Marital status: Single    Spouse name: Not on file   Number of children: Not on file   Years of education: Not on file   Highest education level: Not on file  Occupational History   Occupation: Surgical Instrument cleaner    Employer: [employer]  Tobacco Use   Smoking status: Never   Smokeless tobacco: Never  Vaping Use   Vaping status: Never Used  Substance and Sexual Activity   Alcohol use: Yes    Comment: occasionally    Drug use: No   Sexual activity: Not Currently    Birth control/protection: Condom, Pill  Other Topics Concern    Not on file  Social History Narrative   Not on file   Social Drivers of Health   Financial Resource Strain: Low Risk  (10/27/2022)   Overall Financial Resource Strain (CARDIA)    Difficulty of Paying Living Expenses: Not very hard  Food Insecurity: Food Insecurity Present (10/27/2022)   Hunger Vital Sign    Worried About Running Out of Food in the Last Year: Never true    Ran Out of Food in the Last Year: Sometimes true  Transportation Needs: No Transportation Needs (10/27/2022)   PRAPARE - Administrator, Civil Service (Medical): No    Lack of Transportation (Non-Medical): No  Physical Activity: Insufficiently Active (10/27/2022)   Exercise Vital Sign    Days of Exercise per Week: 1 day    Minutes of Exercise per Session: 30 min  Stress: No Stress Concern Present (10/27/2022)   [employer] of Occupational Health - Occupational Stress Questionnaire    Feeling of Stress : Only a little  Social Connections: Moderately Integrated (10/27/2022)   Social Connection and Isolation Panel [NHANES]    Frequency of Communication with Friends and Family: More than three times a week    Frequency of Social Gatherings with Friends and Family: Twice a week    Attends Religious Services: More than 4 times per year    Active Member of [employer] or Organizations: Yes    Attends Banker Meetings: 1 to 4 times per year    Marital Status: Never married  Intimate Partner Violence: Not At Risk (10/27/2022)   Humiliation, Afraid, Rape, and Kick questionnaire    Fear of Current or Ex-Partner: No    Emotionally Abused: No    Physically Abused: No    Sexually Abused: No    Review of Systems*** Constitutional: Patient denies any unintentional weight loss or change in strength lntegumentary: Patient denies any rashes or pruritus Eyes: Patient {Actions; denies-reports:120008} dry eyes ENT: Patient {Actions; denies-reports:120008} dry mouth Cardiovascular: Patient denies chest pain or  syncope Respiratory: Patient denies shortness of breath Gastrointestinal: Patient ***denies nausea, vomiting, constipation, or diarrhea Musculoskeletal: Patient denies muscle cramps or weakness Neurologic: Patient denies convulsions or seizures Allergic/Immunologic:  Patient denies recent allergic reaction(s) Hematologic/Lymphatic: Patient denies bleeding tendencies Endocrine: Patient denies heat/cold intolerance  GU: As per HPI.  OBJECTIVE There were no vitals filed for this visit. There is no height or weight on file to calculate BMI.  Physical Examination*** Constitutional: No obvious distress; patient is non-toxic appearing  Cardiovascular: No visible lower extremity edema.  Respiratory: The patient does not have audible wheezing/stridor; respirations do not appear labored  Gastrointestinal: Abdomen non-distended Musculoskeletal: Normal ROM of UEs  Skin: No obvious rashes/open sores  Neurologic: CN 2-12 grossly intact Psychiatric: Answered questions appropriately with normal affect  Hematologic/Lymphatic/Immunologic: No obvious bruises or sites of spontaneous bleeding  UA: ***negative *** WBC/hpf, *** RBC/hpf, *** bacteria ***with no evidence of UTI ***with no evidence of microscopic hematuria PVR: *** ml  ASSESSMENT No diagnosis found. *** We discussed the symptoms of overactive bladder (OAB), which include urinary urgency, frequency, nocturia, with or without urge incontinence.   While we may not know the exact etiology of OAB, several risk factors can be identified.  - We discussed this patient's neurogenic risk factors for OAB-type symptoms including ***T2DM, ***nicotine use, ***.  - Likely exacerbated by ***diuretic use, ***caffeine intake, ***consumption of bladder irritants such as (acidic foods, spicy foods, alcohol).   We discussed the following management options in detail including potential benefits, risks, and side effects: Behavioral therapy: Modify fluid  intake Decreasing bladder irritants (such as caffeine, acidic foods, spicy foods, alcohol) Urge suppression strategies Bladder retraining / timed voiding Double voiding Medication(s): - For anticholinergic medications, we discussed the potential side effects of anticholinergics including dry eyes, dry mouth, constipation, cognitive impairment and urinary retention.  - For beta-3 agonist medication, we discussed the risk for urinary retention and the potential side effect of elevated blood pressure specific to Myrbetriq (which is more likely to occur in individuals with uncontrolled hypertension).  For refractory cases: PTNS (posterior tibial nerve stimulation) Sacral neuromodulation trial (Medtronic lnterStim or Axonics implant) Bladder Botox injections In extreme cases, SP tube placement  ***In order to further evaluate urinary incontinence, She was instructed to complete a 3-day bladder diary.  ***Discussed recommendation for urodynamic testing, which was described in detail including risks such as bleeding, infection, organ/tissue/nerve damage.  She decided to proceed with *** ***work on behavioral modifications including ***minimizing caffeine intake and working on ***timed voiding.  Will plan for follow up in ***8 weeks / *** months / ***1 year or sooner if needed. Pt verbalized understanding and agreement. All questions were answered.  PLAN Advised the following: *** ***. Minimize caffeine intake. ***. Work on timed voiding. ***. Urge suppression strategies. ***. Double/ triple voiding. ***. No follow-ups on file.  No orders of the defined types were placed in this encounter.   It has been explained that the patient is to follow regularly with their PCP in addition to all other providers involved in their care and to follow instructions provided by these respective offices. Patient advised to contact urology clinic if any urologic-pertaining questions, concerns, new symptoms  or problems arise in the interim period.  There are no Patient Instructions on file for this visit.  Electronically signed by:  Donnita Falls, FNP   01/19/23    11:13 AM

## 2023-01-23 ENCOUNTER — Other Ambulatory Visit (HOSPITAL_COMMUNITY): Payer: Self-pay

## 2023-01-23 ENCOUNTER — Ambulatory Visit: Payer: 59 | Admitting: Urology

## 2023-01-23 DIAGNOSIS — N3281 Overactive bladder: Secondary | ICD-10-CM

## 2023-01-23 DIAGNOSIS — N3941 Urge incontinence: Secondary | ICD-10-CM

## 2023-01-23 DIAGNOSIS — N393 Stress incontinence (female) (male): Secondary | ICD-10-CM

## 2023-01-23 DIAGNOSIS — S5002XA Contusion of left elbow, initial encounter: Secondary | ICD-10-CM | POA: Diagnosis not present

## 2023-01-24 ENCOUNTER — Other Ambulatory Visit (HOSPITAL_COMMUNITY): Payer: Self-pay

## 2023-01-24 ENCOUNTER — Other Ambulatory Visit: Payer: Self-pay

## 2023-01-24 MED ORDER — MELOXICAM 7.5 MG PO TABS
7.5000 mg | ORAL_TABLET | Freq: Every day | ORAL | 0 refills | Status: DC
  Filled 2023-01-24 – 2023-02-26 (×4): qty 30, 30d supply, fill #0

## 2023-01-25 ENCOUNTER — Other Ambulatory Visit: Payer: Self-pay

## 2023-01-25 ENCOUNTER — Other Ambulatory Visit (HOSPITAL_COMMUNITY): Payer: Self-pay

## 2023-01-26 ENCOUNTER — Other Ambulatory Visit (HOSPITAL_COMMUNITY): Payer: Self-pay

## 2023-01-26 DIAGNOSIS — G4726 Circadian rhythm sleep disorder, shift work type: Secondary | ICD-10-CM | POA: Diagnosis not present

## 2023-01-26 DIAGNOSIS — G4733 Obstructive sleep apnea (adult) (pediatric): Secondary | ICD-10-CM | POA: Diagnosis not present

## 2023-01-26 MED ORDER — MODAFINIL 100 MG PO TABS
100.0000 mg | ORAL_TABLET | Freq: Every day | ORAL | 0 refills | Status: DC
  Filled 2023-01-26: qty 30, 30d supply, fill #0

## 2023-01-30 ENCOUNTER — Other Ambulatory Visit: Payer: Self-pay

## 2023-01-30 ENCOUNTER — Other Ambulatory Visit (HOSPITAL_COMMUNITY): Payer: Self-pay

## 2023-01-30 ENCOUNTER — Encounter: Payer: Self-pay | Admitting: Internal Medicine

## 2023-01-31 ENCOUNTER — Other Ambulatory Visit: Payer: Self-pay

## 2023-02-02 ENCOUNTER — Other Ambulatory Visit (HOSPITAL_COMMUNITY): Payer: Self-pay

## 2023-02-02 DIAGNOSIS — G4733 Obstructive sleep apnea (adult) (pediatric): Secondary | ICD-10-CM | POA: Diagnosis not present

## 2023-02-17 DIAGNOSIS — G5602 Carpal tunnel syndrome, left upper limb: Secondary | ICD-10-CM | POA: Diagnosis not present

## 2023-02-17 DIAGNOSIS — S5002XA Contusion of left elbow, initial encounter: Secondary | ICD-10-CM | POA: Diagnosis not present

## 2023-02-23 ENCOUNTER — Other Ambulatory Visit: Payer: Self-pay | Admitting: Urology

## 2023-02-23 ENCOUNTER — Telehealth: Payer: Self-pay

## 2023-02-23 ENCOUNTER — Ambulatory Visit: Payer: 59 | Admitting: Urology

## 2023-02-23 DIAGNOSIS — N3281 Overactive bladder: Secondary | ICD-10-CM

## 2023-02-23 MED ORDER — OXYBUTYNIN CHLORIDE ER 10 MG PO TB24: 10.0000 mg | ORAL_TABLET | Freq: Every day | ORAL | 0 refills | Status: DC

## 2023-02-23 NOTE — Telephone Encounter (Signed)
My chart message forward to provider "Medication is working good need refills and looking forward to seeing you in the office next week on Thursday at 2:30 sorry about being late today"

## 2023-02-23 NOTE — Progress Notes (Signed)
Name: Karen Hoover DOB: 1990-08-09 MRN: [id number]  History of Present Illness: Ms. Tonn is a 33 y.o. female who presents today for follow up visit at [facility].  - GU history: 1. OAB with urinary frequency, nocturia, urgency, and urge incontinence. 2. Stress urinary incontinence. - Previously did pelvic floor physical therapy but states that was not very helpful.   At initial visit on 11/28/2022: - She elected to prioritize OAB management for now and will consider SUI treatment options if needed in the future.  - The plan was: Start Vesicare 10 mg daily. Minimize caffeine intake. Work on timed voiding / bladder retraining. Urge suppression strategies. Requesting prior records from [facility], including prior urodynamic testing results. Return in about 8 weeks (around 01/23/2023) for UA, PVR, & f/u with Evette Georges NP.  Since last visit: > 12/06/2022: Vesicare discontinued due to side effect (intolerable dry mouth). Gemtesa prescribed as alternative.   > 12/16/2022: Insurance denied Gemtesa coverage until patient has tried Oxybutynin, therefore Oxybutynin prescription sent today as alternative to [employer].  Today: She reports symptomatic improvement since starting Oxybutynin XL 10 mg daily. She reports decreased urinary frequency, nocturia, urgency, and urge incontinence. Now voiding once only a couple nights per week on average instead of nightly. She reports minimal (if any) urine leakage at this point and is not longer wearing pads routinely.  She denies dysuria, gross hematuria, straining to void, or sensations of incomplete emptying.  She reports that she is currently on her menses, which is very heavy today.  Fall Screening: Do you usually have a device to assist in your mobility? No   Medications: Current Outpatient Medications  Medication Sig Dispense Refill   albuterol (PROAIR HFA) 108 (90 Base) MCG/ACT inhaler Inhale 1-2 puffs into  the lungs every 4 (four) hours as needed. 6.7 g 0   aspirin EC 81 MG tablet Take 81 mg by mouth daily.     benzonatate (TESSALON) 100 MG capsule Take 1 capsule (100 mg total) by mouth 2 (two) times daily as needed for cough. (Patient taking differently: Take 100 mg by mouth as needed.) 15 capsule 0   Blood Glucose Monitoring Suppl (FREESTYLE LITE) w/Device KIT Use as directed to test 4 times daily 1 kit 0   cetirizine (ZYRTEC ALLERGY) 10 MG tablet Take 1 tablet (10 mg total) by mouth daily. (Patient taking differently: Take 10 mg by mouth.) 30 tablet 0   cholestyramine (QUESTRAN) 4 g packet Take 1 packet mixed with water or non-carbonated drink by mouth three times a day. 90 packet 1   Continuous Glucose Sensor (FREESTYLE LIBRE 3 SENSOR) MISC Change every 14 days to monitor blood glucose continously 2 each 6   Cyanocobalamin (VITAMIN B-12) 1000 MCG SUBL Place 1 tablet under the tongue.     Dulaglutide (TRULICITY) 1.5 MG/0.5ML SOAJ Inject 1.5 mg into the skin once a week. 2 mL 6   Empagliflozin-metFORMIN HCl ER (SYNJARDY XR) 25-1000 MG TB24 Take 1 tablet by mouth daily with breakfast. 30 tablet 6   Empagliflozin-metFORMIN HCl ER (SYNJARDY XR) 25-1000 MG TB24 Take 1 tablet by mouth daily with breakfast. 90 tablet 3   escitalopram (LEXAPRO) 20 MG tablet Take 1 tablet (20 mg total) by mouth daily. 90 tablet 3   Ferrous Sulfate (IRON PO) Take 1 tablet by mouth daily.     fluticasone (FLONASE) 50 MCG/ACT nasal spray Place 2 sprays into both nostrils daily. 16 g 2   gabapentin (NEURONTIN) 300 MG capsule Take  300 mg by mouth daily as needed (pain).     glucose blood (FREESTYLE LITE) test strip Use to test blood sugar 4 times daily 100 strip 5   glucose blood test strip Use to test 2 times daily. 100 each 3   guaiFENesin-codeine (ROBITUSSIN AC) 100-10 MG/5ML syrup Take 5 mLs by mouth 3 (three) times daily as needed for cough. (Patient taking differently: Take 5 mLs by mouth as needed for cough.) 120 mL 0    Ibuprofen 200 MG CAPS Take 800 mg by mouth every 8 (eight) hours as needed for moderate pain.      Lancets (FREESTYLE) lancets Use to test blood sugar 4 times daily 400 each 5   levothyroxine (SYNTHROID) 100 MCG tablet Take 1 tablet (100 mcg total) by mouth in the morning on an empty stomach 90 tablet 3   levothyroxine (SYNTHROID) 112 MCG tablet Take 1 tablet (112 mcg total) by mouth every morning on an empty stomach 90 tablet 3   meloxicam (MOBIC) 7.5 MG tablet Take 1 tablet (7.5 mg total) by mouth daily. 30 tablet 0   meloxicam (MOBIC) 7.5 MG tablet Take 1 tablet (7.5 mg total) by mouth daily. 30 tablet 0   methocarbamol (ROBAXIN) 500 MG tablet Take 1 tablet (500 mg total) by mouth every 8 (eight) hours. 90 tablet 0   modafinil (PROVIGIL) 100 MG tablet Take 1 tablet (100 mg total) by mouth daily at start of shift 30 tablet 0   Multiple Vitamin (MULTIVITAMIN WITH MINERALS) TABS tablet Take 1 tablet by mouth daily. OTC     naproxen (NAPROSYN) 500 MG tablet Take 1 tablet (500 mg total) by mouth 2 (two) times daily with a meal. 30 tablet 0   norethindrone-ethinyl estradiol (LOESTRIN) 1-20 MG-MCG tablet Take 1 tablet by mouth daily, continuously, no inert pills 84 tablet 4   OneTouch Delica Lancets 33G MISC Use to self monitor blood glucose twice daily (DX: ICD10: R73.01)     Zinc Sulfate (ZINC 15 PO) Take by mouth. OTC     oxybutynin (DITROPAN-XL) 10 MG 24 hr tablet Take 1 tablet (10 mg total) by mouth daily. 90 tablet 3   No current facility-administered medications for this visit.    Allergies: Allergies  Allergen Reactions   Metformin Hcl Er Other (See Comments)    diarrhea   Vesicare [Solifenacin] Other (See Comments)    Intolerable dry mouth    Past Medical History:  Diagnosis Date   Anemia    Anxiety 03/17/2015   BV (bacterial vaginosis) 04/10/2014   Chronic lower back pain    2 herniated disc sees Dr Channing Mutters   Complication of anesthesia    O2 desaturation, Hypotension, Fluid  filled lungs   Diabetes mellitus without complication (HCC)    Encounter for menstrual regulation 03/13/2014   Family history of breast cancer    Family history of melanoma    Fatty liver 09/05/2018   Gallstones 09/05/2018   Refer to Dr Henreitta Leber   Hemorrhoids 01/12/2015   History of rectal bleeding 01/12/2015   Hypothyroidism    IC (interstitial cystitis) 01/22/2015   Iron deficiency anemia due to chronic blood loss 06/06/2022   Pulmonary edema 08/21/2015   S/P back OR   Sleep apnea    Thyroid disease    Tonsillitis    Vaginal discharge 03/13/2014   Vaginal itching 01/12/2015   Vestibulitis, vulvar 04/21/2014   Yeast infection 03/13/2014   Past Surgical History:  Procedure Laterality Date   BACK SURGERY  CHOLECYSTECTOMY N/A 10/17/2018   Procedure: LAPAROSCOPIC CHOLECYSTECTOMY;  Surgeon: Lucretia Roers, MD;  Location: [facility];  Service: General;  Laterality: N/A;   LAMINECTOMY AND MICRODISCECTOMY LUMBAR SPINE Right 08/21/2015   L4-5 and L5-S 1   Family History  Problem Relation Age of Onset   Diabetes Mother    Thyroid disease Mother    Breast cancer Mother 33       CHEK2 pos   Thyroid disease Brother    Diabetes Maternal Grandmother    Melanoma Maternal Grandmother        d. 32   Melanoma Maternal Grandfather        d. 47   Cancer Paternal Grandmother    Breast cancer Cousin 53       mother's pat first cousin   Kidney cancer Other        MGF's sister   Sleep apnea Neg Hx    Social History   Socioeconomic History   Marital status: Single    Spouse name: Not on file   Number of children: Not on file   Years of education: Not on file   Highest education level: Not on file  Occupational History   Occupation: Surgical Instrument cleaner    Employer: [employer]  Tobacco Use   Smoking status: Never   Smokeless tobacco: Never  Vaping Use   Vaping status: Never Used  Substance and Sexual Activity   Alcohol use: Yes    Comment: occasionally     Drug use: No   Sexual activity: Not Currently    Birth control/protection: Condom, Pill  Other Topics Concern   Not on file  Social History Narrative   Not on file   Social Drivers of Health   Financial Resource Strain: Low Risk  (10/27/2022)   Overall Financial Resource Strain (CARDIA)    Difficulty of Paying Living Expenses: Not very hard  Food Insecurity: Food Insecurity Present (10/27/2022)   Hunger Vital Sign    Worried About Running Out of Food in the Last Year: Never true    Ran Out of Food in the Last Year: Sometimes true  Transportation Needs: No Transportation Needs (10/27/2022)   PRAPARE - Administrator, Civil Service (Medical): No    Lack of Transportation (Non-Medical): No  Physical Activity: Insufficiently Active (10/27/2022)   Exercise Vital Sign    Days of Exercise per Week: 1 day    Minutes of Exercise per Session: 30 min  Stress: No Stress Concern Present (10/27/2022)   [employer] of Occupational Health - Occupational Stress Questionnaire    Feeling of Stress : Only a little  Social Connections: Moderately Integrated (10/27/2022)   Social Connection and Isolation Panel [NHANES]    Frequency of Communication with Friends and Family: More than three times a week    Frequency of Social Gatherings with Friends and Family: Twice a week    Attends Religious Services: More than 4 times per year    Active Member of [employer] or Organizations: Yes    Attends Banker Meetings: 1 to 4 times per year    Marital Status: Never married  Intimate Partner Violence: Not At Risk (10/27/2022)   Humiliation, Afraid, Rape, and Kick questionnaire    Fear of Current or Ex-Partner: No    Emotionally Abused: No    Physically Abused: No    Sexually Abused: No    Review of Systems Constitutional: Patient denies any unintentional weight loss or change in  strength lntegumentary: Patient denies any rashes or pruritus Eyes: Patient denies dry eyes ENT: Patient  denies dry mouth Cardiovascular: Patient denies chest pain or syncope Respiratory: Patient denies shortness of breath Gastrointestinal: Patient denies constipation  Musculoskeletal: Patient denies muscle cramps or weakness Neurologic: Patient denies convulsions or seizures Allergic/Immunologic: Patient denies recent allergic reaction(s) Hematologic/Lymphatic: Patient denies bleeding tendencies Endocrine: Patient denies heat/cold intolerance  GU: As per HPI.  OBJECTIVE Vitals:   03/02/23 1425  BP: 111/72  Pulse: 99  Temp: 98 F (36.7 C)   There is no height or weight on file to calculate BMI.  Physical Examination Constitutional: No obvious distress; patient is non-toxic appearing  Cardiovascular: No visible lower extremity edema.  Respiratory: The patient does not have audible wheezing/stridor; respirations do not appear labored  Gastrointestinal: Abdomen non-distended Musculoskeletal: Normal ROM of UEs  Skin: No obvious rashes/open sores  Neurologic: CN 2-12 grossly intact Psychiatric: Answered questions appropriately with normal affect  Hematologic/Lymphatic/Immunologic: No obvious bruises or sites of spontaneous bleeding  UA: >30 RBC/hpf (patient reports this is due to menstrual blood); no evidence of UTI PVR: 7 ml  ASSESSMENT OAB (overactive bladder) - Plan: Urinalysis, Routine w reflex microscopic, BLADDER SCAN AMB NON-IMAGING, oxybutynin (DITROPAN-XL) 10 MG 24 hr tablet  OAB symptoms are much improved with Oxybutynin XL, which she is tolerating well. We agreed to continue that; refills sent.   Will plan for follow up in 6 months or sooner if needed. Pt verbalized understanding and agreement. All questions were answered.   PLAN Advised the following: 1. Continue Oxybutynin XL 10 mg daily. 2. Return in about 6 months (around 08/30/2023) for UA, PVR, & f/u with Evette Georges NP.  Orders Placed This Encounter  Procedures   Urinalysis, Routine w reflex microscopic    BLADDER SCAN AMB NON-IMAGING    It has been explained that the patient is to follow regularly with their PCP in addition to all other providers involved in their care and to follow instructions provided by these respective offices. Patient advised to contact urology clinic if any urologic-pertaining questions, concerns, new symptoms or problems arise in the interim period.  There are no Patient Instructions on file for this visit.  Electronically signed by:  Donnita Falls, FNP   03/02/23    3:05 PM

## 2023-02-27 ENCOUNTER — Other Ambulatory Visit: Payer: Self-pay

## 2023-02-27 ENCOUNTER — Other Ambulatory Visit (HOSPITAL_COMMUNITY): Payer: Self-pay

## 2023-02-27 MED ORDER — MELOXICAM 7.5 MG PO TABS
7.5000 mg | ORAL_TABLET | Freq: Every day | ORAL | 0 refills | Status: DC
  Filled 2023-02-27: qty 30, 30d supply, fill #0

## 2023-02-28 ENCOUNTER — Encounter: Payer: Self-pay | Admitting: Physician Assistant

## 2023-03-01 ENCOUNTER — Other Ambulatory Visit: Payer: Self-pay

## 2023-03-01 ENCOUNTER — Other Ambulatory Visit: Payer: Self-pay | Admitting: *Deleted

## 2023-03-01 DIAGNOSIS — D500 Iron deficiency anemia secondary to blood loss (chronic): Secondary | ICD-10-CM

## 2023-03-02 ENCOUNTER — Ambulatory Visit: Payer: 59 | Admitting: Urology

## 2023-03-02 ENCOUNTER — Inpatient Hospital Stay: Payer: 59 | Attending: Physician Assistant

## 2023-03-02 ENCOUNTER — Encounter: Payer: Self-pay | Admitting: Urology

## 2023-03-02 ENCOUNTER — Other Ambulatory Visit (HOSPITAL_COMMUNITY): Payer: Self-pay

## 2023-03-02 ENCOUNTER — Telehealth: Payer: Self-pay | Admitting: *Deleted

## 2023-03-02 VITALS — BP 111/72 | HR 99 | Temp 98.0°F

## 2023-03-02 DIAGNOSIS — N3281 Overactive bladder: Secondary | ICD-10-CM | POA: Diagnosis not present

## 2023-03-02 DIAGNOSIS — Z79899 Other long term (current) drug therapy: Secondary | ICD-10-CM | POA: Diagnosis not present

## 2023-03-02 DIAGNOSIS — D72829 Elevated white blood cell count, unspecified: Secondary | ICD-10-CM | POA: Insufficient documentation

## 2023-03-02 DIAGNOSIS — D75839 Thrombocytosis, unspecified: Secondary | ICD-10-CM | POA: Insufficient documentation

## 2023-03-02 DIAGNOSIS — E611 Iron deficiency: Secondary | ICD-10-CM | POA: Insufficient documentation

## 2023-03-02 DIAGNOSIS — D500 Iron deficiency anemia secondary to blood loss (chronic): Secondary | ICD-10-CM

## 2023-03-02 LAB — URINALYSIS, ROUTINE W REFLEX MICROSCOPIC
Bilirubin, UA: NEGATIVE
Ketones, UA: NEGATIVE
Leukocytes,UA: NEGATIVE
Nitrite, UA: NEGATIVE
Protein,UA: NEGATIVE
Specific Gravity, UA: 1.02 (ref 1.005–1.030)
Urobilinogen, Ur: 0.2 mg/dL (ref 0.2–1.0)
pH, UA: 6 (ref 5.0–7.5)

## 2023-03-02 LAB — IRON AND TIBC
Iron: 42 ug/dL (ref 28–170)
Saturation Ratios: 9 % — ABNORMAL LOW (ref 10.4–31.8)
TIBC: 454 ug/dL — ABNORMAL HIGH (ref 250–450)
UIBC: 412 ug/dL

## 2023-03-02 LAB — CBC
HCT: 41.4 % (ref 36.0–46.0)
Hemoglobin: 13.3 g/dL (ref 12.0–15.0)
MCH: 26.5 pg (ref 26.0–34.0)
MCHC: 32.1 g/dL (ref 30.0–36.0)
MCV: 82.5 fL (ref 80.0–100.0)
Platelets: 391 10*3/uL (ref 150–400)
RBC: 5.02 MIL/uL (ref 3.87–5.11)
RDW: 15.2 % (ref 11.5–15.5)
WBC: 13.3 10*3/uL — ABNORMAL HIGH (ref 4.0–10.5)
nRBC: 0 % (ref 0.0–0.2)

## 2023-03-02 LAB — MICROSCOPIC EXAMINATION
Bacteria, UA: NONE SEEN
RBC, Urine: >30 /[HPF] — AB (ref 0–2)

## 2023-03-02 LAB — FERRITIN: Ferritin: 209 ng/mL (ref 11–307)

## 2023-03-02 LAB — BLADDER SCAN AMB NON-IMAGING: Scan Result: 7

## 2023-03-02 MED ORDER — OXYBUTYNIN CHLORIDE ER 10 MG PO TB24
10.0000 mg | ORAL_TABLET | Freq: Every day | ORAL | 3 refills | Status: DC
  Filled 2023-03-02: qty 90, 90d supply, fill #0
  Filled 2023-05-31: qty 90, 90d supply, fill #1
  Filled 2023-07-15 – 2023-09-17 (×2): qty 90, 90d supply, fill #2
  Filled 2023-12-10: qty 90, 90d supply, fill #3

## 2023-03-02 NOTE — Telephone Encounter (Signed)
Patient called to advise that she is having heavy vaginal bleeding.  Discussed with Rojelio Brenner PA-C.  Labs obtained today and reviewed by Durenda Hurt NP.  Will schedule patient to have Venofer 400 mg IV x 2 doses.

## 2023-03-03 ENCOUNTER — Other Ambulatory Visit (HOSPITAL_COMMUNITY): Payer: Self-pay

## 2023-03-03 ENCOUNTER — Encounter (HOSPITAL_COMMUNITY): Payer: Self-pay | Admitting: Obstetrics and Gynecology

## 2023-03-03 ENCOUNTER — Inpatient Hospital Stay (HOSPITAL_COMMUNITY)
Admission: AD | Admit: 2023-03-03 | Discharge: 2023-03-03 | Disposition: A | Discharge status: Home / Self Care | Payer: 59 | Attending: Obstetrics and Gynecology | Admitting: Obstetrics and Gynecology

## 2023-03-03 DIAGNOSIS — N921 Excessive and frequent menstruation with irregular cycle: Secondary | ICD-10-CM | POA: Insufficient documentation

## 2023-03-03 DIAGNOSIS — Z3202 Encounter for pregnancy test, result negative: Secondary | ICD-10-CM | POA: Diagnosis not present

## 2023-03-03 DIAGNOSIS — Z79890 Hormone replacement therapy (postmenopausal): Secondary | ICD-10-CM | POA: Insufficient documentation

## 2023-03-03 DIAGNOSIS — Z7985 Long-term (current) use of injectable non-insulin antidiabetic drugs: Secondary | ICD-10-CM | POA: Insufficient documentation

## 2023-03-03 DIAGNOSIS — Z793 Long term (current) use of hormonal contraceptives: Secondary | ICD-10-CM | POA: Insufficient documentation

## 2023-03-03 DIAGNOSIS — E119 Type 2 diabetes mellitus without complications: Secondary | ICD-10-CM | POA: Diagnosis not present

## 2023-03-03 DIAGNOSIS — E039 Hypothyroidism, unspecified: Secondary | ICD-10-CM | POA: Insufficient documentation

## 2023-03-03 DIAGNOSIS — E669 Obesity, unspecified: Secondary | ICD-10-CM | POA: Insufficient documentation

## 2023-03-03 DIAGNOSIS — N939 Abnormal uterine and vaginal bleeding, unspecified: Secondary | ICD-10-CM | POA: Insufficient documentation

## 2023-03-03 DIAGNOSIS — Z7984 Long term (current) use of oral hypoglycemic drugs: Secondary | ICD-10-CM | POA: Insufficient documentation

## 2023-03-03 LAB — POCT PREGNANCY, URINE: Preg Test, Ur: NEGATIVE

## 2023-03-03 MED ORDER — IBUPROFEN 800 MG PO TABS
800.0000 mg | ORAL_TABLET | Freq: Three times a day (TID) | ORAL | 3 refills | Status: DC | PRN
  Filled 2023-03-03: qty 30, 10d supply, fill #0

## 2023-03-03 MED ORDER — MEGESTROL ACETATE 40 MG PO TABS
ORAL_TABLET | ORAL | 0 refills | Status: DC
  Filled 2023-03-03: qty 30, 15d supply, fill #0

## 2023-03-03 NOTE — MAU Note (Signed)
.  Karen Hoover is a 33 y.o. at Unknown here in MAU reporting: Heavy vaginal bleeding with clots and severe lower abdominal pain. She reports she was told by [facility] to go to the [facility] ER. She reports her bleeding began last Friday and she reports each day her bleeding has gotten heavier. She reports yesterday she was bleeding through pads. She reports passing clots the size of a ping pong ball since yesterday. She reports yesterday she was soaking through pads but today she is not bleeding that heavily. She reports she was told yesterday she needs iron and they set this up for next week but she is afraid to continue forward with this level of bleeding.  Last took 500 mg of Tylenol around midnight.  Seen yesterday in Urology office yesterday and had labs drawn. OAB.   Note in epic from 1/23: Patient called to advise that she is having heavy vaginal bleeding. Discussed with Rojelio Brenner PA-C. Labs obtained today and reviewed by Durenda Hurt NP. Will schedule patient to have Venofer 400 mg IV x 2 doses.   Onset of complaint: One week Pain score: 6/10 lower abdomen  Vitals:   03/03/23 1406  BP: 132/85  Pulse: (!) 111  Resp: 16  Temp: 98.9 F (37.2 C)  SpO2: 100%      FHT: n/a Lab orders placed from triage: POCT Preg

## 2023-03-03 NOTE — MAU Provider Note (Signed)
Chief Complaint: Abdominal Pain and Vaginal Bleeding   Event Date/Time   First Provider Initiated Contact with Patient 03/03/23 1422      SUBJECTIVE HPI: Karen Hoover is a 33 y.o. G0P0000 (not pregnant) who is presenting for heavy vaginal bleeding. Patient with history of heavy and irregular periods. Co-morbidities of T2DM, hypothyroidism, obesity.  Patient with heavy periods as a teen. Has since been on OCPs and periods have been regular and light with this intervention. Did have a similar episode of heavy bleeding in August but had not happened since, so did not have a work-up at that time. She has been heavily bleeding for past 3 days. Will bleed through a pad an hour at times and has some golf ball-sized clots. Denies feeling dizzy/lightheaded, has not passed out. Has lightened slightly today. Has an appointment with her OB on Tuesday. Still taking her loestrin as prescribed.  HPI  Past Medical History:  Diagnosis Date   Anemia    Anxiety 03/17/2015   BV (bacterial vaginosis) 04/10/2014   Chronic lower back pain    2 herniated disc sees Dr Channing Mutters   Complication of anesthesia    O2 desaturation, Hypotension, Fluid filled lungs   Diabetes mellitus without complication (HCC)    Encounter for menstrual regulation 03/13/2014   Family history of breast cancer    Family history of melanoma    Fatty liver 09/05/2018   Gallstones 09/05/2018   Refer to Dr Henreitta Leber   Hemorrhoids 01/12/2015   History of rectal bleeding 01/12/2015   Hypothyroidism    IC (interstitial cystitis) 01/22/2015   Iron deficiency anemia due to chronic blood loss 06/06/2022   Pulmonary edema 08/21/2015   S/P back OR   Sleep apnea    Thyroid disease    Tonsillitis    Vaginal discharge 03/13/2014   Vaginal itching 01/12/2015   Vestibulitis, vulvar 04/21/2014   Yeast infection 03/13/2014   Past Surgical History:  Procedure Laterality Date   BACK SURGERY     CHOLECYSTECTOMY N/A 10/17/2018   Procedure:  LAPAROSCOPIC CHOLECYSTECTOMY;  Surgeon: Lucretia Roers, MD;  Location: [facility];  Service: General;  Laterality: N/A;   LAMINECTOMY AND MICRODISCECTOMY LUMBAR SPINE Right 08/21/2015   L4-5 and L5-S 1   Social History   Socioeconomic History   Marital status: Single    Spouse name: Not on file   Number of children: Not on file   Years of education: Not on file   Highest education level: Not on file  Occupational History   Occupation: Surgical Instrument cleaner    Employer: [employer]  Tobacco Use   Smoking status: Never   Smokeless tobacco: Never  Vaping Use   Vaping status: Never Used  Substance and Sexual Activity   Alcohol use: Yes    Comment: occasionally    Drug use: No   Sexual activity: Not Currently    Birth control/protection: Condom, Pill  Other Topics Concern   Not on file  Social History Narrative   Not on file   Social Drivers of Health   Financial Resource Strain: Low Risk  (10/27/2022)   Overall Financial Resource Strain (CARDIA)    Difficulty of Paying Living Expenses: Not very hard  Food Insecurity: Food Insecurity Present (10/27/2022)   Hunger Vital Sign    Worried About Running Out of Food in the Last Year: Never true    Ran Out of Food in the Last Year: Sometimes true  Transportation Needs: No Transportation Needs (10/27/2022)  PRAPARE - Administrator, Civil Service (Medical): No    Lack of Transportation (Non-Medical): No  Physical Activity: Insufficiently Active (10/27/2022)   Exercise Vital Sign    Days of Exercise per Week: 1 day    Minutes of Exercise per Session: 30 min  Stress: No Stress Concern Present (10/27/2022)   [employer] of Occupational Health - Occupational Stress Questionnaire    Feeling of Stress : Only a little  Social Connections: Moderately Integrated (10/27/2022)   Social Connection and Isolation Panel [NHANES]    Frequency of Communication with Friends and Family: More than three times a week     Frequency of Social Gatherings with Friends and Family: Twice a week    Attends Religious Services: More than 4 times per year    Active Member of [employer] or Organizations: Yes    Attends Banker Meetings: 1 to 4 times per year    Marital Status: Never married  Intimate Partner Violence: Not At Risk (10/27/2022)   Humiliation, Afraid, Rape, and Kick questionnaire    Fear of Current or Ex-Partner: No    Emotionally Abused: No    Physically Abused: No    Sexually Abused: No   No current facility-administered medications on file prior to encounter.   Current Outpatient Medications on File Prior to Encounter  Medication Sig Dispense Refill   albuterol (PROAIR HFA) 108 (90 Base) MCG/ACT inhaler Inhale 1-2 puffs into the lungs every 4 (four) hours as needed. 6.7 g 0   Blood Glucose Monitoring Suppl (FREESTYLE LITE) w/Device KIT Use as directed to test 4 times daily 1 kit 0   cholestyramine (QUESTRAN) 4 g packet Take 1 packet mixed with water or non-carbonated drink by mouth three times a day. 90 packet 1   Continuous Glucose Sensor (FREESTYLE LIBRE 3 SENSOR) MISC Change every 14 days to monitor blood glucose continously 2 each 6   Cyanocobalamin (VITAMIN B-12) 1000 MCG SUBL Place 1 tablet under the tongue.     Dulaglutide (TRULICITY) 1.5 MG/0.5ML SOAJ Inject 1.5 mg into the skin once a week. 2 mL 6   Empagliflozin-metFORMIN HCl ER (SYNJARDY XR) 25-1000 MG TB24 Take 1 tablet by mouth daily with breakfast. 30 tablet 6   Empagliflozin-metFORMIN HCl ER (SYNJARDY XR) 25-1000 MG TB24 Take 1 tablet by mouth daily with breakfast. 90 tablet 3   escitalopram (LEXAPRO) 20 MG tablet Take 1 tablet (20 mg total) by mouth daily. 90 tablet 3   Ferrous Sulfate (IRON PO) Take 1 tablet by mouth daily.     fluticasone (FLONASE) 50 MCG/ACT nasal spray Place 2 sprays into both nostrils daily. 16 g 2   gabapentin (NEURONTIN) 300 MG capsule Take 300 mg by mouth daily as needed (pain).     glucose blood  (FREESTYLE LITE) test strip Use to test blood sugar 4 times daily 100 strip 5   glucose blood test strip Use to test 2 times daily. 100 each 3   Lancets (FREESTYLE) lancets Use to test blood sugar 4 times daily 400 each 5   levothyroxine (SYNTHROID) 100 MCG tablet Take 1 tablet (100 mcg total) by mouth in the morning on an empty stomach 90 tablet 3   levothyroxine (SYNTHROID) 112 MCG tablet Take 1 tablet (112 mcg total) by mouth every morning on an empty stomach 90 tablet 3   methocarbamol (ROBAXIN) 500 MG tablet Take 1 tablet (500 mg total) by mouth every 8 (eight) hours. 90 tablet 0   modafinil (PROVIGIL)  100 MG tablet Take 1 tablet (100 mg total) by mouth daily at start of shift 30 tablet 0   Multiple Vitamin (MULTIVITAMIN WITH MINERALS) TABS tablet Take 1 tablet by mouth daily. OTC     OneTouch Delica Lancets 33G MISC Use to self monitor blood glucose twice daily (DX: ICD10: R73.01)     oxybutynin (DITROPAN-XL) 10 MG 24 hr tablet Take 1 tablet (10 mg total) by mouth daily. 90 tablet 3   Zinc Sulfate (ZINC 15 PO) Take by mouth. OTC     Allergies  Allergen Reactions   Metformin Hcl Er Other (See Comments)    diarrhea   Vesicare [Solifenacin] Other (See Comments)    Intolerable dry mouth    ROS:  Pertinent positives/negatives listed above.  I have reviewed patient's Past Medical Hx, Surgical Hx, Family Hx, Social Hx, medications and allergies.   Physical Exam  Patient Vitals for the past 24 hrs:  BP Temp Temp src Pulse Resp SpO2 Height Weight  03/03/23 1406 132/85 98.9 F (37.2 C) Oral (!) 111 16 100 % -- --  03/03/23 1402 -- -- -- -- -- -- 5\' 5"  (1.651 m) 121 kg   Constitutional: Well-developed, well-nourished female in no acute distress. Appears anxious  Cardiovascular: normal rate Respiratory: normal effort GI: Abd soft, non-tender. Pos BS x 4 MS: Extremities nontender, no edema, normal ROM Neurologic: Alert and oriented x 4.  GU: Neg CVAT  LAB RESULTS Results for orders  placed or performed during the hospital encounter of 03/03/23 (from the past 24 hours)  Pregnancy, urine POC     Status: None   Collection Time: 03/03/23  2:39 PM  Result Value Ref Range   Preg Test, Ur NEGATIVE NEGATIVE       IMAGING No results found.  MAU Management/MDM: Orders Placed This Encounter  Procedures   US PELVIS TRANSVAGINAL NON-OB (TV ONLY)   Pregnancy, urine POC   Discharge patient    Meds ordered this encounter  Medications   megestrol (MEGACE) 40 MG tablet    Sig: Take 2 tablets twice per day for 3 days, then 1 tablet twice per day until bleeding stops    Dispense:  30 tablet    Refill:  0   ibuprofen (ADVIL) 800 MG tablet    Sig: Take 1 tablet (800 mg total) by mouth every 8 (eight) hours as needed for headache or moderate pain (pain score 4-6).    Dispense:  30 tablet    Refill:  3    Patient presents for heavy period. Pregnancy test is negative. I also reviewed lab results from yesterday which show Hgb 13.3 and normal iron studies. At this time, patient is certainly having a heavy cycle but is not showing signs of instability or an anemia currently, so will not pursue repeat labs today. Reassuringly it is also lightening. Discussed stopping OCP and taking a course of Megace to slow bleeding. Can resume OCP after Megace is complete. Additionally advised ibuprofen and tylenol for pain. I also ordered an outpatient TVUS. She has an appointment with her OB on Tuesday which I encouraged her to keep. Return precautions for instability discussed.  ASSESSMENT 1. Abnormal uterine bleeding   2. Menorrhagia with irregular cycle     PLAN Discharge home with strict return precautions. Allergies as of 03/03/2023       Reactions   Metformin Hcl Er Other (See Comments)   diarrhea   Vesicare [solifenacin] Other (See Comments)   Intolerable dry mouth  Medication List     STOP taking these medications    aspirin EC 81 MG tablet   benzonatate 100 MG  capsule Commonly known as: TESSALON   cetirizine 10 MG tablet Commonly known as: ZyrTEC Allergy   guaiFENesin-codeine 100-10 MG/5ML syrup Commonly known as: ROBITUSSIN AC   Ibuprofen 200 MG Caps Replaced by: ibuprofen 800 MG tablet   meloxicam 7.5 MG tablet Commonly known as: MOBIC   naproxen 500 MG tablet Commonly known as: NAPROSYN   norethindrone-ethinyl estradiol 1-20 MG-MCG tablet Commonly known as: LOESTRIN       TAKE these medications    albuterol 108 (90 Base) MCG/ACT inhaler Commonly known as: ProAir HFA Inhale 1-2 puffs into the lungs every 4 (four) hours as needed.   cholestyramine 4 g packet Commonly known as: QUESTRAN Take 1 packet mixed with water or non-carbonated drink by mouth three times a day.   escitalopram 20 MG tablet Commonly known as: LEXAPRO Take 1 tablet (20 mg total) by mouth daily.   fluticasone 50 MCG/ACT nasal spray Commonly known as: FLONASE Place 2 sprays into both nostrils daily.   FreeStyle Libre 3 Sensor Misc Change every 14 days to monitor blood glucose continously   FREESTYLE LITE test strip Generic drug: glucose blood Use to test 2 times daily.   FREESTYLE LITE test strip Generic drug: glucose blood Use to test blood sugar 4 times daily   FreeStyle Lite w/Device Kit Use as directed to test 4 times daily   gabapentin 300 MG capsule Commonly known as: NEURONTIN Take 300 mg by mouth daily as needed (pain).   ibuprofen 800 MG tablet Commonly known as: ADVIL Take 1 tablet (800 mg total) by mouth every 8 (eight) hours as needed for headache or moderate pain (pain score 4-6). Replaces: Ibuprofen 200 MG Caps   IRON PO Take 1 tablet by mouth daily.   levothyroxine 100 MCG tablet Commonly known as: SYNTHROID Take 1 tablet (100 mcg total) by mouth in the morning on an empty stomach   levothyroxine 112 MCG tablet Commonly known as: SYNTHROID Take 1 tablet (112 mcg total) by mouth every morning on an empty stomach    megestrol 40 MG tablet Commonly known as: MEGACE Take 2 tablets twice per day for 3 days, then 1 tablet twice per day until bleeding stops   methocarbamol 500 MG tablet Commonly known as: ROBAXIN Take 1 tablet (500 mg total) by mouth every 8 (eight) hours.   modafinil 100 MG tablet Commonly known as: PROVIGIL Take 1 tablet (100 mg total) by mouth daily at start of shift   multivitamin with minerals Tabs tablet Take 1 tablet by mouth daily. OTC   OneTouch Delica Lancets 33G Misc Use to self monitor blood glucose twice daily (DX: ICD10: R73.01)   freestyle lancets Use to test blood sugar 4 times daily   oxybutynin 10 MG 24 hr tablet Commonly known as: DITROPAN-XL Take 1 tablet (10 mg total) by mouth daily.   Synjardy XR 25-1000 MG Tb24 Generic drug: Empagliflozin-metFORMIN HCl ER Take 1 tablet by mouth daily with breakfast.   Synjardy XR 25-1000 MG Tb24 Generic drug: Empagliflozin-metFORMIN HCl ER Take 1 tablet by mouth daily with breakfast.   Trulicity 1.5 MG/0.5ML Soaj Generic drug: Dulaglutide Inject 1.5 mg into the skin once a week.   Vitamin B-12 1000 MCG Subl Place 1 tablet under the tongue.   ZINC 15 PO Take by mouth. OTC         Wylene Simmer, MD  OB Fellow 03/03/2023  3:02 PM

## 2023-03-07 ENCOUNTER — Encounter: Payer: Self-pay | Admitting: Adult Health

## 2023-03-07 ENCOUNTER — Ambulatory Visit: Payer: 59 | Admitting: Adult Health

## 2023-03-07 VITALS — BP 127/84 | HR 89 | Ht 65.0 in | Wt 268.5 lb

## 2023-03-07 DIAGNOSIS — N939 Abnormal uterine and vaginal bleeding, unspecified: Secondary | ICD-10-CM | POA: Diagnosis not present

## 2023-03-07 DIAGNOSIS — Z3041 Encounter for surveillance of contraceptive pills: Secondary | ICD-10-CM | POA: Diagnosis not present

## 2023-03-07 DIAGNOSIS — Z3202 Encounter for pregnancy test, result negative: Secondary | ICD-10-CM | POA: Diagnosis not present

## 2023-03-07 LAB — POCT URINE PREGNANCY: Preg Test, Ur: NEGATIVE

## 2023-03-07 MED ORDER — NEXTSTELLIS 3-14.2 MG PO TABS: 1.0000 | ORAL_TABLET | Freq: Every day | ORAL | Status: DC

## 2023-03-07 NOTE — Progress Notes (Signed)
  Subjective:     Patient ID: Karen Hoover, female   DOB: Dec 08, 1990, 33 y.o.   MRN: [id number]  HPI Karen Hoover is a 33 year old white female,single, G0P0, in for complaints of abnormal bleeding on OC, period was heavy with clots, went to MAU 03/03/23 and told to stop OC and take megace and bleeding has stopped. HGB was 13.3 and iron sat was low is getting iron infusion tomorrow and 1 next week.     Component Value Date/Time   DIAGPAP  10/27/2022 1132    - Negative for intraepithelial lesion or malignancy (NILM)   DIAGPAP  07/17/2019 1119    - Negative for intraepithelial lesion or malignancy (NILM)   DIAGPAP  03/23/2016 0000    NEGATIVE FOR INTRAEPITHELIAL LESIONS OR MALIGNANCY.   HPVHIGH Negative 10/27/2022 1132   HPVHIGH Negative 07/17/2019 1119   ADEQPAP Satisfactory for evaluation. 10/27/2022 1132   ADEQPAP  07/17/2019 1119    Satisfactory for evaluation; transformation zone component PRESENT.   ADEQPAP  03/23/2016 0000    Satisfactory for evaluation  endocervical/transformation zone component PRESENT.    PCP is DR Link Snuffer   Review of Systems Bleeding has stopped Not currently having sex Reviewed past medical,surgical, social and family history. Reviewed medications and allergies.     Objective:   Physical Exam BP 127/84 (BP Location: Left Arm, Patient Position: Sitting, Cuff Size: Normal)   Pulse 89   Ht 5\' 5"  (1.651 m)   Wt 268 lb 8 oz (121.8 kg)   LMP 02/24/2023   BMI 44.68 kg/m     UPT is negative  Skin warm and dry.Pelvic: external genitalia is normal in appearance no lesions, vagina: scant brown discharge,urethra has no lesions or masses noted, cervix:smooth, uterus: normal size, shape and contour, non tender, no masses felt, adnexa: no masses or tenderness noted. Bladder is non tender and no masses felt. Fall risk is low  Upstream - 03/07/23 1519       Pregnancy Intention Screening   Does the patient want to become pregnant in the next year? No    Does the  patient's partner want to become pregnant in the next year? No    Would the patient like to discuss contraceptive options today? Yes      Contraception Wrap Up   Current Method Abstinence    End Method Oral Contraceptive;Abstinence    Contraception Counseling Provided Yes    How was the end contraceptive method provided? Provided on site            Examination chaperoned by Toula Moos NP student  Assessment:     1. Pregnancy examination or test, negative result - POCT urine pregnancy  2. Abnormal uterine bleeding (Primary) Bleeding stopped with megace Will get pelvic US in 2 weeks to assess uterus and ovaries  - US PELVIC COMPLETE WITH TRANSVAGINAL; Future  3. Encounter for surveillance of contraceptive pills Will try Nextstellis 3 packs given, can start in am     Plan:     Follow up in 2 weeks for pelvic US and 10 weeks for ROS

## 2023-03-08 ENCOUNTER — Inpatient Hospital Stay: Payer: 59

## 2023-03-08 VITALS — BP 116/70 | HR 56 | Temp 97.5°F | Resp 16 | Ht 65.0 in | Wt 265.0 lb

## 2023-03-08 DIAGNOSIS — D75839 Thrombocytosis, unspecified: Secondary | ICD-10-CM | POA: Diagnosis not present

## 2023-03-08 DIAGNOSIS — D500 Iron deficiency anemia secondary to blood loss (chronic): Secondary | ICD-10-CM

## 2023-03-08 DIAGNOSIS — D72829 Elevated white blood cell count, unspecified: Secondary | ICD-10-CM | POA: Diagnosis not present

## 2023-03-08 DIAGNOSIS — Z79899 Other long term (current) drug therapy: Secondary | ICD-10-CM | POA: Diagnosis not present

## 2023-03-08 DIAGNOSIS — E611 Iron deficiency: Secondary | ICD-10-CM | POA: Diagnosis not present

## 2023-03-08 MED ORDER — ACETAMINOPHEN 325 MG PO TABS
650.0000 mg | ORAL_TABLET | Freq: Once | ORAL | Status: AC
  Administered 2023-03-08: 650 mg via ORAL
  Filled 2023-03-08: qty 2

## 2023-03-08 MED ORDER — SODIUM CHLORIDE 0.9 % IV SOLN: Freq: Once | INTRAVENOUS | Status: AC

## 2023-03-08 MED ORDER — SODIUM CHLORIDE 0.9 % IV SOLN
400.0000 mg | Freq: Once | INTRAVENOUS | Status: AC
  Administered 2023-03-08: 400 mg via INTRAVENOUS
  Filled 2023-03-08: qty 400

## 2023-03-08 NOTE — Patient Instructions (Signed)
[facility] - A DEPT OF [facility]  Discharge Instructions: Thank you for choosing [facility] to provide your oncology and hematology care.  If you have a lab appointment with the Cancer Center - please note that after April 8th, 2024, all labs will be drawn in the cancer center.  You do not have to check in or register with the main entrance as you have in the past but will complete your check-in in the cancer center.   We strive to give you quality time with your provider. You may need to reschedule your appointment if you arrive late (15 or more minutes).  Arriving late affects you and other patients whose appointments are after yours.  Also, if you miss three or more appointments without notifying the office, you may be dismissed from the clinic at the provider's discretion.      For prescription refill requests, have your pharmacy contact our office and allow 72 hours for refills to be completed.    Today you received the following: Venofer iron infusion    To help prevent nausea and vomiting after your treatment, we encourage you to take your nausea medication as directed.  BELOW ARE SYMPTOMS THAT SHOULD BE REPORTED IMMEDIATELY: *FEVER GREATER THAN 100.4 F (38 C) OR HIGHER *CHILLS OR SWEATING *NAUSEA AND VOMITING THAT IS NOT CONTROLLED WITH YOUR NAUSEA MEDICATION *UNUSUAL SHORTNESS OF BREATH *UNUSUAL BRUISING OR BLEEDING *URINARY PROBLEMS (pain or burning when urinating, or frequent urination) *BOWEL PROBLEMS (unusual diarrhea, constipation, pain near the anus) TENDERNESS IN MOUTH AND THROAT WITH OR WITHOUT PRESENCE OF ULCERS (sore throat, sores in mouth, or a toothache) UNUSUAL RASH, SWELLING OR PAIN  UNUSUAL VAGINAL DISCHARGE OR ITCHING   Items with * indicate a potential emergency and should be followed up as soon as possible or go to the Emergency Department if any problems should occur.  Please show the CHEMOTHERAPY ALERT CARD or  IMMUNOTHERAPY ALERT CARD at check-in to the Emergency Department and triage nurse.  Should you have questions after your visit or need to cancel or reschedule your appointment, please contact [facility] [place] - A DEPT OF [facility] [phone]  and follow the prompts.  Office hours are 8:00 a.m. to 4:30 p.m. Monday - Friday. Please note that voicemails left after 4:00 p.m. may not be returned until the following business day.  We are closed weekends and major holidays. You have access to a nurse at all times for urgent questions. Please call the main number to the clinic [phone] and follow the prompts.  For any non-urgent questions, you may also contact your provider using MyChart. We now offer e-Visits for anyone 82 and older to request care online for non-urgent symptoms. For details visit [email or web address].   Also download the MyChart app! Go to the app store, search "MyChart", open the app, select [facility], and log in with your MyChart username and password.

## 2023-03-08 NOTE — Progress Notes (Signed)
Patient took her allergy pill at home today.   Venofer 400 mg iron  infusion given per orders. Patient tolerated it well without problems. Vitals stable and discharged home from clinic ambulatory. Follow up as scheduled.

## 2023-03-15 ENCOUNTER — Inpatient Hospital Stay: Payer: 59 | Attending: Physician Assistant

## 2023-03-15 VITALS — BP 122/62 | HR 86 | Temp 97.9°F | Resp 18 | Wt 265.0 lb

## 2023-03-15 DIAGNOSIS — E611 Iron deficiency: Secondary | ICD-10-CM | POA: Diagnosis not present

## 2023-03-15 DIAGNOSIS — D500 Iron deficiency anemia secondary to blood loss (chronic): Secondary | ICD-10-CM

## 2023-03-15 MED ORDER — ACETAMINOPHEN 325 MG PO TABS
650.0000 mg | ORAL_TABLET | Freq: Once | ORAL | Status: AC
  Administered 2023-03-15: 650 mg via ORAL
  Filled 2023-03-15: qty 2

## 2023-03-15 MED ORDER — SODIUM CHLORIDE 0.9 % IV SOLN
400.0000 mg | Freq: Once | INTRAVENOUS | Status: AC
  Administered 2023-03-15: 400 mg via INTRAVENOUS
  Filled 2023-03-15: qty 400

## 2023-03-15 MED ORDER — SODIUM CHLORIDE 0.9 % IV SOLN: Freq: Once | INTRAVENOUS | Status: AC

## 2023-03-15 NOTE — Patient Instructions (Signed)

## 2023-03-15 NOTE — Progress Notes (Signed)
 Patient presents today for Venofer infusion per providers order.  Vital signs WNL.  Patient has no new complaints at this time.  Peripheral IV started and blood return noted pre and post infusion.  Stable during infusion without adverse affects.  Vital signs stable.  No complaints at this time.  Discharge from clinic ambulatory in stable condition.  Alert and oriented X 3.  Follow up with [facility] as scheduled.

## 2023-03-16 ENCOUNTER — Ambulatory Visit: Payer: 59 | Admitting: Adult Health

## 2023-03-17 ENCOUNTER — Ambulatory Visit: Payer: 59

## 2023-03-17 DIAGNOSIS — N939 Abnormal uterine and vaginal bleeding, unspecified: Secondary | ICD-10-CM | POA: Diagnosis not present

## 2023-03-17 NOTE — Progress Notes (Signed)
 PELVIC US  TA/TV:homogeneous anteverted uterus,WNL,EEC 4.5 mm,normal ovaries,limited view of right ovary,no obvious abnormalities,no free fluid,no pain during ultrasound  Chaperone Peggy

## 2023-03-20 ENCOUNTER — Other Ambulatory Visit: Payer: 59

## 2023-03-23 DIAGNOSIS — G5622 Lesion of ulnar nerve, left upper limb: Secondary | ICD-10-CM | POA: Diagnosis not present

## 2023-03-23 DIAGNOSIS — G5602 Carpal tunnel syndrome, left upper limb: Secondary | ICD-10-CM | POA: Diagnosis not present

## 2023-03-25 ENCOUNTER — Other Ambulatory Visit (HOSPITAL_COMMUNITY): Payer: Self-pay

## 2023-03-27 ENCOUNTER — Other Ambulatory Visit (HOSPITAL_COMMUNITY): Payer: Self-pay

## 2023-03-30 ENCOUNTER — Other Ambulatory Visit (HOSPITAL_COMMUNITY): Payer: Self-pay

## 2023-04-11 ENCOUNTER — Other Ambulatory Visit (HOSPITAL_COMMUNITY): Payer: Self-pay

## 2023-04-11 DIAGNOSIS — S5002XA Contusion of left elbow, initial encounter: Secondary | ICD-10-CM | POA: Diagnosis not present

## 2023-04-11 DIAGNOSIS — G5602 Carpal tunnel syndrome, left upper limb: Secondary | ICD-10-CM | POA: Diagnosis not present

## 2023-04-11 MED ORDER — MELOXICAM 15 MG PO TABS
15.0000 mg | ORAL_TABLET | Freq: Every day | ORAL | 0 refills | Status: AC
  Filled 2023-04-11: qty 30, 30d supply, fill #0

## 2023-04-12 ENCOUNTER — Other Ambulatory Visit (HOSPITAL_COMMUNITY): Payer: Self-pay

## 2023-04-13 ENCOUNTER — Other Ambulatory Visit (HOSPITAL_COMMUNITY): Payer: Self-pay

## 2023-04-13 DIAGNOSIS — G4733 Obstructive sleep apnea (adult) (pediatric): Secondary | ICD-10-CM | POA: Diagnosis not present

## 2023-04-13 DIAGNOSIS — G4726 Circadian rhythm sleep disorder, shift work type: Secondary | ICD-10-CM | POA: Diagnosis not present

## 2023-04-13 MED ORDER — FREESTYLE LIBRE 3 SENSOR MISC
1.0000 | 6 refills | Status: DC
  Filled 2023-04-13: qty 2, 28d supply, fill #0
  Filled 2023-05-18: qty 2, 28d supply, fill #1
  Filled 2023-06-11: qty 2, 28d supply, fill #2
  Filled 2023-07-15 – 2023-07-17 (×2): qty 2, 28d supply, fill #3
  Filled 2023-08-09: qty 2, 28d supply, fill #4
  Filled 2023-09-17: qty 2, 28d supply, fill #5
  Filled 2023-11-11: qty 2, 28d supply, fill #6

## 2023-04-13 MED ORDER — MODAFINIL 100 MG PO TABS
100.0000 mg | ORAL_TABLET | Freq: Every day | ORAL | 2 refills | Status: AC
Start: 2023-04-13 — End: ?
  Filled 2023-04-29 – 2023-05-04 (×2): qty 30, 30d supply, fill #0

## 2023-04-29 ENCOUNTER — Other Ambulatory Visit (HOSPITAL_COMMUNITY): Payer: Self-pay

## 2023-05-01 ENCOUNTER — Other Ambulatory Visit (HOSPITAL_COMMUNITY): Payer: Self-pay

## 2023-05-01 ENCOUNTER — Other Ambulatory Visit: Payer: Self-pay

## 2023-05-01 ENCOUNTER — Other Ambulatory Visit: Payer: Self-pay | Admitting: Adult Health

## 2023-05-01 MED ORDER — NEXTSTELLIS 3-14.2 MG PO TABS
1.0000 | ORAL_TABLET | Freq: Every day | ORAL | 12 refills | Status: DC
  Filled 2023-05-01: qty 28, 28d supply, fill #0
  Filled 2023-06-11: qty 28, 28d supply, fill #1
  Filled 2023-07-15 – 2023-07-24 (×2): qty 28, 28d supply, fill #2
  Filled 2023-08-20: qty 28, 28d supply, fill #3
  Filled 2023-09-17: qty 28, 28d supply, fill #4
  Filled 2023-10-18: qty 28, 28d supply, fill #5
  Filled 2023-11-19: qty 28, 28d supply, fill #6
  Filled 2023-12-10 – 2023-12-18 (×2): qty 28, 28d supply, fill #7

## 2023-05-01 NOTE — Telephone Encounter (Signed)
 Refill nextstellis

## 2023-05-02 ENCOUNTER — Other Ambulatory Visit (HOSPITAL_COMMUNITY): Payer: Self-pay

## 2023-05-04 ENCOUNTER — Other Ambulatory Visit (HOSPITAL_COMMUNITY): Payer: Self-pay

## 2023-05-05 ENCOUNTER — Other Ambulatory Visit (HOSPITAL_COMMUNITY): Payer: Self-pay

## 2023-05-16 ENCOUNTER — Ambulatory Visit: Payer: 59 | Admitting: Adult Health

## 2023-05-17 ENCOUNTER — Other Ambulatory Visit (HOSPITAL_COMMUNITY): Payer: Self-pay

## 2023-05-17 MED ORDER — FREESTYLE LITE TEST VI STRP
ORAL_STRIP | Freq: Two times a day (BID) | 3 refills | Status: DC
  Filled 2023-05-17: qty 150, 75d supply, fill #0

## 2023-05-18 ENCOUNTER — Other Ambulatory Visit (HOSPITAL_COMMUNITY): Payer: Self-pay

## 2023-05-19 ENCOUNTER — Encounter: Payer: Self-pay | Admitting: Adult Health

## 2023-05-19 ENCOUNTER — Ambulatory Visit: Admitting: Adult Health

## 2023-05-19 VITALS — BP 133/81 | HR 108 | Ht 65.0 in | Wt 268.5 lb

## 2023-05-19 DIAGNOSIS — N939 Abnormal uterine and vaginal bleeding, unspecified: Secondary | ICD-10-CM | POA: Diagnosis not present

## 2023-05-19 DIAGNOSIS — Z3041 Encounter for surveillance of contraceptive pills: Secondary | ICD-10-CM | POA: Diagnosis not present

## 2023-05-19 NOTE — Progress Notes (Signed)
  Subjective:     Patient ID: Karen Hoover, female   DOB: [birth year], 33 y.o.   MRN: [id number]  HPI Karen Hoover is a 33 year old white female, single, G0P0, back in follow up on starting Nextstellis for AUB in January and bleeding much better, had period in February 9-25 and spotted March 24 for 1 day and started 4/9.     Component Value Date/Time   DIAGPAP  10/27/2022 1132    - Negative for intraepithelial lesion or malignancy (NILM)   DIAGPAP  07/17/2019 1119    - Negative for intraepithelial lesion or malignancy (NILM)   DIAGPAP  03/23/2016 0000    NEGATIVE FOR INTRAEPITHELIAL LESIONS OR MALIGNANCY.   HPVHIGH Negative 10/27/2022 1132   HPVHIGH Negative 07/17/2019 1119   ADEQPAP Satisfactory for evaluation. 10/27/2022 1132   ADEQPAP  07/17/2019 1119    Satisfactory for evaluation; transformation zone component PRESENT.   ADEQPAP  03/23/2016 0000    Satisfactory for evaluation  endocervical/transformation zone component PRESENT.   PCP is Dr Javier Docker   Review of Systems Periods better with nextstellis    had period in February 9-25 and spotted March 24 for 1 day and started 4/9. Reviewed past medical,surgical, social and family history. Reviewed medications and allergies.  Objective:   Physical Exam BP 133/81 (BP Location: Left Arm, Patient Position: Sitting, Cuff Size: Large)   Pulse (!) 108   Ht 5\' 5"  (1.651 m)   Wt 268 lb 8 oz (121.8 kg)   LMP 05/18/2023 (Exact Date)   BMI 44.68 kg/m   Skin warm and dry. Lungs: clear to ausculation bilaterally. Cardiovascular: regular rate and rhythm.   Upstream - 05/19/23 1018       Pregnancy Intention Screening   Does the patient want to become pregnant in the next year? No    Does the patient's partner want to become pregnant in the next year? No    Would the patient like to discuss contraceptive options today? No      Contraception Wrap Up   Current Method Abstinence;Oral Contraceptive    End Method Abstinence;Oral Contraceptive     Contraception Counseling Provided Yes                Assessment:     1. Encounter for surveillance of contraceptive pills (Primary) Continue nextstellis, has refills  Periods much better   2. Abnormal uterine bleeding Has resolved     Plan:     Follow up about 10/30/23 for physical or sooner if needed

## 2023-05-29 ENCOUNTER — Inpatient Hospital Stay: Payer: 59

## 2023-05-31 ENCOUNTER — Other Ambulatory Visit (HOSPITAL_COMMUNITY): Payer: Self-pay

## 2023-05-31 ENCOUNTER — Inpatient Hospital Stay: Attending: Physician Assistant

## 2023-05-31 DIAGNOSIS — Z1501 Genetic susceptibility to malignant neoplasm of breast: Secondary | ICD-10-CM | POA: Insufficient documentation

## 2023-05-31 DIAGNOSIS — Z803 Family history of malignant neoplasm of breast: Secondary | ICD-10-CM | POA: Insufficient documentation

## 2023-05-31 DIAGNOSIS — D72829 Elevated white blood cell count, unspecified: Secondary | ICD-10-CM | POA: Diagnosis not present

## 2023-05-31 DIAGNOSIS — D75839 Thrombocytosis, unspecified: Secondary | ICD-10-CM | POA: Insufficient documentation

## 2023-05-31 DIAGNOSIS — N920 Excessive and frequent menstruation with regular cycle: Secondary | ICD-10-CM | POA: Insufficient documentation

## 2023-05-31 DIAGNOSIS — D500 Iron deficiency anemia secondary to blood loss (chronic): Secondary | ICD-10-CM

## 2023-05-31 DIAGNOSIS — Z1509 Genetic susceptibility to other malignant neoplasm: Secondary | ICD-10-CM | POA: Insufficient documentation

## 2023-05-31 DIAGNOSIS — D75838 Other thrombocytosis: Secondary | ICD-10-CM

## 2023-05-31 DIAGNOSIS — E611 Iron deficiency: Secondary | ICD-10-CM | POA: Diagnosis not present

## 2023-05-31 LAB — CBC WITH DIFFERENTIAL/PLATELET
Abs Immature Granulocytes: 0.11 10*3/uL — ABNORMAL HIGH (ref 0.00–0.07)
Basophils Absolute: 0.1 10*3/uL (ref 0.0–0.1)
Basophils Relative: 1 %
Eosinophils Absolute: 0.3 10*3/uL (ref 0.0–0.5)
Eosinophils Relative: 2 %
HCT: 43.9 % (ref 36.0–46.0)
Hemoglobin: 14 g/dL (ref 12.0–15.0)
Immature Granulocytes: 1 %
Lymphocytes Relative: 22 %
Lymphs Abs: 3.2 10*3/uL (ref 0.7–4.0)
MCH: 26.7 pg (ref 26.0–34.0)
MCHC: 31.9 g/dL (ref 30.0–36.0)
MCV: 83.8 fL (ref 80.0–100.0)
Monocytes Absolute: 0.8 10*3/uL (ref 0.1–1.0)
Monocytes Relative: 6 %
Neutro Abs: 10 10*3/uL — ABNORMAL HIGH (ref 1.7–7.7)
Neutrophils Relative %: 68 %
Platelets: 371 10*3/uL (ref 150–400)
RBC: 5.24 MIL/uL — ABNORMAL HIGH (ref 3.87–5.11)
RDW: 14.6 % (ref 11.5–15.5)
WBC: 14.7 10*3/uL — ABNORMAL HIGH (ref 4.0–10.5)
nRBC: 0 % (ref 0.0–0.2)

## 2023-05-31 LAB — IRON AND TIBC
Iron: 54 ug/dL (ref 28–170)
Saturation Ratios: 13 % (ref 10.4–31.8)
TIBC: 403 ug/dL (ref 250–450)
UIBC: 349 ug/dL

## 2023-05-31 LAB — FERRITIN: Ferritin: 435 ng/mL — ABNORMAL HIGH (ref 11–307)

## 2023-05-31 MED ORDER — CHOLESTYRAMINE 4 G PO PACK
4.0000 g | PACK | Freq: Three times a day (TID) | ORAL | 1 refills | Status: DC
  Filled 2023-05-31: qty 90, 30d supply, fill #0
  Filled 2023-07-15 – 2023-07-17 (×2): qty 90, 30d supply, fill #1

## 2023-06-01 ENCOUNTER — Other Ambulatory Visit (HOSPITAL_COMMUNITY): Payer: Self-pay

## 2023-06-02 NOTE — Progress Notes (Addendum)
 VIRTUAL VISIT via TELEPHONE NOTE [facility]   I connected with Karen Hoover  on 06/05/23 at  9:50 AM by telephone and verified that I am speaking with the correct person using two identifiers.  Location: Patient: Home Provider: [facility]   I discussed the limitations, risks, security and privacy concerns of performing an evaluation and management service by telephone and the availability of in person appointments. I also discussed with the patient that there may be a patient responsible charge related to this service. The patient expressed understanding and agreed to proceed.  REASON FOR VISIT:  Follow-up for leukocytosis, thrombocytosis, and iron  deficiency   PRIOR THERAPY: None   CURRENT THERAPY: Ferrous sulfate  325 mg daily and intermittent IV iron   INTERVAL HISTORY:   Karen Hoover 33 y.o. female returns for routine follow-up of leukocytosis, thrombocytosis, and iron  deficiency.  She was last seen by Sheril Dines PA-C on 12/06/2022.  She received Venofer  400 mg x 1 on 12/15/2022.    She contacted clinic in January 2025 to report heavy vaginal bleeding.  Labs from 03/02/2023 showed normal ferritin (209), but low iron  saturation 9% and elevated TIBC 454.  Hgb was normal at 13.3.  She was given Venofer  400 mg x 2 (January/February 2025), and reports feeling better after her iron  infusions.  At today's visit, she reports feeling fairly well.  No recent hospitalizations, surgeries, or changes in baseline health status.  She has been taking iron  tablet daily without any issues. She reports that her periods are regular with moderate to heavy bleeding - now taking Nextstellis  (instead of lo-loestrin ) to help decrease her bleeding.  She denies any other source of blood loss such as hematemesis, hematochezia, melena, or epistaxis.  She improved fatigue after most recent IV iron . She feels better energy when she uses her CPAP at night.  She denies any pica,  restless legs, headaches, chest pain, dyspnea exertion, lightheadedness, or syncope.  She denies any infections since her last visit; no B symptoms.  She has 90% energy and 90% appetite. She endorses that she is maintaining a stable weight.  ASSESSMENT & PLAN:  1.  Leukocytosis and thrombocytosis: - Nonspecific leukocytosis with intermittently elevated lymphocytes and neutrophils at least since July 2017. - MPN work-up was negative.  (Negative BCR/ABL FISH.  Negative JAK2/CALR/MPL.  Flow cytometry negative for monoclonal B-cell population or abnormal T-cell phenotype.)  LDH normal.  Inflammatory markers were elevated (CRP 5.7, ESR 28).  Negative ANA and RF. - She is not on any steroids.  No history of splenectomy.  No known connective tissue disease.  Non-smoker. - Denies any fevers.  She has occasional sweats, averaging once per week, waist down, for the past several months, during daytime when she sleeps.  She usually works at night.  She has not had any abnormal weight loss. - She denies any recurrent infections.     - Most recent labs (05/31/2023): WBC 14.7/ANC 10.0, platelets normal (371).  Platelets improved after IV iron  supplementation. - PLAN: No concern for malignant leukocytosis or thrombocytosis at this time. - Suspect reactive thrombocytosis from inflammation, obesity, and iron  deficiency. - Suspect reactive leukocytosis in the setting of morbid obesity and chronic inflammation. - No further work-up planned at this time, but would consider bone marrow biopsy if any significant deviation from baseline.  2.  Iron  deficiency with microcytosis, without anemia: - CBC (12/11/2020) shows normal Hgb 13.0, but microcytosis with MCV 78.6, and elevated RBC 5.37. - Iron  panel (  12/11/2020) shows iron  deficiency with ferritin 17, iron  saturation 6%, and TIBC 608.  Folate and B12 were normal. - She has intermittently heavy periods.  Currently taking Nextstellis  via GYN. - Started ferrous sulfate   daily in November 2022, tolerating it well - Most recent IV iron  in January/February 2025 - Most recent labs (05/31/2023): Hgb 14.0/MCV 83.8, ferritin 435, iron  saturation 13% with normal TIBC 403 - PLAN: No IV iron  at this time.  - Continue oral iron  supplementation with ferrous sulfate  325 mg daily, with Colace as needed for constipation - Repeat CBC and iron  panel in 6 months, followed by OFFICE visit - Patient instructed to stop donating blood until her iron  deficiency has resolved.   3.  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  Per genetic counselor note: Approximately 48% lifetime risk of breast cancer.  Annual mammogram and breast MRI screening starting at age 48, or 85 years younger than earliest age of onset.  Risk reducing mastectomy could be considered. Approximately 10 to 14% lifetime risk of colon cancer.  Colonoscopy every 5 years starting at age 69. - Patient is not sure when her mother was diagnosed with breast cancer, but thinks that it was "sometime in her 24s" - PLAN: Patient has been provided with copy of genetic counselors report and instructed to follow these recommendations via her PCP. - Patient encouraged to find out specific age of for members diagnosis of cancer, so that she can start screening protocol at appropriate time.  4.  Morbid obesity - Discussed with patient various strategies toward improving her health, particularly focusing on one change at a time to make steps toward healthier lifestyle overall.  Patient is optimistic and would like to make changes to her health.     5.  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  She is a non-smoker. - Mother had breast cancer.  Maternal great grandmother had cancer.   PLAN SUMMARY: >> Labs in 6 months = CBC/D, ferritin, iron /TIBC >> OFFICE  visit in 6 months (after labs)     REVIEW OF SYSTEMS:   Review of Systems  Constitutional:  Negative for chills, diaphoresis, fever, malaise/fatigue and weight loss.  Respiratory:  Negative for cough and shortness of breath.   Cardiovascular:  Negative for chest pain and palpitations.  Gastrointestinal:  Negative for abdominal pain, blood in stool, melena, nausea and vomiting.  Neurological:  Positive for tingling. Negative for dizziness and headaches.  Psychiatric/Behavioral:  Positive for depression. The patient is nervous/anxious.     PHYSICAL EXAM: (per limitations of virtual telephone visit)  The patient is alert and oriented x 3, exhibiting adequate mentation, good mood, and ability to speak in full sentences and execute sound judgement.  WRAP UP:   I discussed the assessment and treatment plan with the patient. The patient was provided an opportunity to ask questions and all were answered. The patient agreed with the plan and demonstrated an understanding of the instructions.   The patient was advised to call back or seek an in-person evaluation if the symptoms worsen or if the condition fails to improve as anticipated.  I provided 22 minutes of non-face-to-face time during this encounter, including > 10 minutes of medical discussion.  Sonnie Dusky, PA-C 06/05/23 10:06 AM

## 2023-06-05 ENCOUNTER — Inpatient Hospital Stay: Payer: 59 | Admitting: Physician Assistant

## 2023-06-05 DIAGNOSIS — D7589 Other specified diseases of blood and blood-forming organs: Secondary | ICD-10-CM

## 2023-06-05 DIAGNOSIS — E611 Iron deficiency: Secondary | ICD-10-CM | POA: Diagnosis not present

## 2023-06-05 DIAGNOSIS — D500 Iron deficiency anemia secondary to blood loss (chronic): Secondary | ICD-10-CM

## 2023-06-05 DIAGNOSIS — Z809 Family history of malignant neoplasm, unspecified: Secondary | ICD-10-CM

## 2023-06-05 DIAGNOSIS — D75838 Other thrombocytosis: Secondary | ICD-10-CM | POA: Diagnosis not present

## 2023-06-05 DIAGNOSIS — D72829 Elevated white blood cell count, unspecified: Secondary | ICD-10-CM | POA: Diagnosis not present

## 2023-06-06 ENCOUNTER — Encounter: Payer: Self-pay | Admitting: *Deleted

## 2023-06-11 ENCOUNTER — Other Ambulatory Visit (HOSPITAL_COMMUNITY): Payer: Self-pay

## 2023-06-16 ENCOUNTER — Other Ambulatory Visit (HOSPITAL_COMMUNITY): Payer: Self-pay

## 2023-06-16 DIAGNOSIS — K9089 Other intestinal malabsorption: Secondary | ICD-10-CM | POA: Diagnosis not present

## 2023-06-16 DIAGNOSIS — E1142 Type 2 diabetes mellitus with diabetic polyneuropathy: Secondary | ICD-10-CM | POA: Diagnosis not present

## 2023-06-16 DIAGNOSIS — D473 Essential (hemorrhagic) thrombocythemia: Secondary | ICD-10-CM | POA: Diagnosis not present

## 2023-06-16 DIAGNOSIS — E039 Hypothyroidism, unspecified: Secondary | ICD-10-CM | POA: Diagnosis not present

## 2023-06-16 DIAGNOSIS — D72829 Elevated white blood cell count, unspecified: Secondary | ICD-10-CM | POA: Diagnosis not present

## 2023-06-16 DIAGNOSIS — G4733 Obstructive sleep apnea (adult) (pediatric): Secondary | ICD-10-CM | POA: Diagnosis not present

## 2023-06-16 DIAGNOSIS — F4323 Adjustment disorder with mixed anxiety and depressed mood: Secondary | ICD-10-CM | POA: Diagnosis not present

## 2023-06-16 DIAGNOSIS — D509 Iron deficiency anemia, unspecified: Secondary | ICD-10-CM | POA: Diagnosis not present

## 2023-06-16 DIAGNOSIS — F331 Major depressive disorder, recurrent, moderate: Secondary | ICD-10-CM | POA: Diagnosis not present

## 2023-06-16 DIAGNOSIS — Z6841 Body Mass Index (BMI) 40.0 and over, adult: Secondary | ICD-10-CM | POA: Diagnosis not present

## 2023-06-16 MED ORDER — BUPROPION HCL ER (XL) 150 MG PO TB24
150.0000 mg | ORAL_TABLET | Freq: Every morning | ORAL | 5 refills | Status: AC
  Filled 2023-06-16 (×2): qty 30, 30d supply, fill #0
  Filled 2023-07-15 – 2023-07-17 (×2): qty 30, 30d supply, fill #1
  Filled 2023-07-30 – 2023-08-10 (×5): qty 30, 30d supply, fill #2
  Filled 2023-09-17: qty 30, 30d supply, fill #3
  Filled 2023-10-18 – 2024-01-12 (×2): qty 30, 30d supply, fill #4
  Filled 2024-02-09 – 2024-02-21 (×2): qty 30, 30d supply, fill #5

## 2023-06-16 MED ORDER — TRULICITY 3 MG/0.5ML ~~LOC~~ SOAJ
3.0000 mg | SUBCUTANEOUS | 5 refills | Status: DC
  Filled 2023-06-16 – 2023-08-01 (×4): qty 2, 28d supply, fill #0
  Filled 2023-08-21: qty 2, 28d supply, fill #1

## 2023-06-18 ENCOUNTER — Other Ambulatory Visit: Payer: Self-pay

## 2023-06-19 ENCOUNTER — Other Ambulatory Visit (HOSPITAL_COMMUNITY): Payer: Self-pay

## 2023-06-19 MED ORDER — TRULICITY 3 MG/0.5ML ~~LOC~~ SOAJ
3.0000 mg | SUBCUTANEOUS | 5 refills | Status: DC
  Filled 2023-06-19 – 2023-06-23 (×3): qty 2, 28d supply, fill #0
  Filled 2023-08-21 – 2023-08-24 (×2): qty 2, 28d supply, fill #1
  Filled 2023-09-17: qty 2, 28d supply, fill #2

## 2023-06-21 ENCOUNTER — Other Ambulatory Visit (HOSPITAL_COMMUNITY): Payer: Self-pay

## 2023-06-22 DIAGNOSIS — G4733 Obstructive sleep apnea (adult) (pediatric): Secondary | ICD-10-CM | POA: Diagnosis not present

## 2023-06-23 ENCOUNTER — Other Ambulatory Visit (HOSPITAL_COMMUNITY): Payer: Self-pay

## 2023-06-30 DIAGNOSIS — F4323 Adjustment disorder with mixed anxiety and depressed mood: Secondary | ICD-10-CM | POA: Diagnosis not present

## 2023-07-12 DIAGNOSIS — F4323 Adjustment disorder with mixed anxiety and depressed mood: Secondary | ICD-10-CM | POA: Diagnosis not present

## 2023-07-17 ENCOUNTER — Other Ambulatory Visit (HOSPITAL_COMMUNITY): Payer: Self-pay

## 2023-07-17 ENCOUNTER — Other Ambulatory Visit: Payer: Self-pay

## 2023-07-18 ENCOUNTER — Other Ambulatory Visit: Payer: Self-pay

## 2023-07-18 ENCOUNTER — Other Ambulatory Visit (HOSPITAL_COMMUNITY): Payer: Self-pay

## 2023-07-24 ENCOUNTER — Other Ambulatory Visit: Payer: Self-pay

## 2023-07-24 ENCOUNTER — Other Ambulatory Visit (HOSPITAL_COMMUNITY): Payer: Self-pay

## 2023-07-31 ENCOUNTER — Other Ambulatory Visit (HOSPITAL_COMMUNITY): Payer: Self-pay

## 2023-08-01 ENCOUNTER — Other Ambulatory Visit: Payer: Self-pay

## 2023-08-01 ENCOUNTER — Other Ambulatory Visit (HOSPITAL_COMMUNITY): Payer: Self-pay

## 2023-08-04 ENCOUNTER — Other Ambulatory Visit (HOSPITAL_COMMUNITY): Payer: Self-pay

## 2023-08-07 ENCOUNTER — Other Ambulatory Visit (HOSPITAL_COMMUNITY): Payer: Self-pay

## 2023-08-07 ENCOUNTER — Encounter: Payer: Self-pay | Admitting: Podiatry

## 2023-08-07 ENCOUNTER — Ambulatory Visit (INDEPENDENT_AMBULATORY_CARE_PROVIDER_SITE_OTHER): Admitting: Podiatry

## 2023-08-07 DIAGNOSIS — B351 Tinea unguium: Secondary | ICD-10-CM

## 2023-08-07 MED ORDER — NEOMYCIN-POLYMYXIN-HC 3.5-10000-1 OT SUSP
OTIC | 0 refills | Status: DC
  Filled 2023-08-07: qty 10, 30d supply, fill #0

## 2023-08-07 NOTE — Progress Notes (Signed)
  Subjective:  Patient ID: Karen Hoover, female    DOB: 1991/02/06,  MRN: [id number]  Chief Complaint  Patient presents with   Nail Problem    I been keeping my right big toe wrapped up because my toenail is loose like a loose tooth  It hurts sometime.    33 y.o. female presents with the above complaint. History confirmed with patient.  This is a same nail she had removed a few years ago after breaking the toe  Objective:  Physical Exam: warm, good capillary refill, no trophic changes or ulcerative lesions, normal DP and PT pulses, normal sensory exam, and significant onycholysis and discoloration. Assessment:   1. Onychomycosis      Plan:  Patient was evaluated and treated and all questions answered.    Detached Nail, right -Patient elects to proceed with minor surgery to remove toenail today. Consent reviewed and signed by patient. - Right hallux nail excised. See procedure note. -Educated on post-procedure care including soaking. Written instructions provided and reviewed. -Rx for Cortisporin  sent to pharmacy. -Advised on signs and symptoms of infection developing.  We discussed that the phenol likely will create some redness and edema and tenderness around the nailbed as long as it is localized this is to be expected.  Will return as needed if any infection signs develop  Procedure: Excision of detached toenail Location: Right 1st toe nail  Anesthesia: Lidocaine  1% plain; 1.5 mL and Marcaine  0.5% plain; 1.5 mL, digital block. Skin Prep: Betadine. Dressing: Silvadene; telfa; dry, sterile, compression dressing. Technique: Following skin prep, the toe was exsanguinated and a tourniquet was secured at the base of the toe. The affected nail was freed and excised. Chemical matrixectomy was then performed with phenol and irrigated out with alcohol . The tourniquet was then removed and sterile dressing applied. Disposition: Patient tolerated procedure well.    Return if  symptoms worsen or fail to improve.

## 2023-08-07 NOTE — Patient Instructions (Signed)

## 2023-08-08 ENCOUNTER — Other Ambulatory Visit (HOSPITAL_COMMUNITY): Payer: Self-pay

## 2023-08-09 ENCOUNTER — Other Ambulatory Visit (HOSPITAL_COMMUNITY): Payer: Self-pay

## 2023-08-15 ENCOUNTER — Encounter: Payer: Self-pay | Admitting: Podiatry

## 2023-08-16 DIAGNOSIS — F4323 Adjustment disorder with mixed anxiety and depressed mood: Secondary | ICD-10-CM | POA: Diagnosis not present

## 2023-08-17 ENCOUNTER — Other Ambulatory Visit: Payer: Self-pay

## 2023-08-21 ENCOUNTER — Other Ambulatory Visit (HOSPITAL_COMMUNITY): Payer: Self-pay

## 2023-08-31 ENCOUNTER — Ambulatory Visit (INDEPENDENT_AMBULATORY_CARE_PROVIDER_SITE_OTHER): Payer: 59 | Admitting: Urology

## 2023-08-31 ENCOUNTER — Encounter: Payer: Self-pay | Admitting: Urology

## 2023-08-31 VITALS — BP 119/79 | HR 93

## 2023-08-31 DIAGNOSIS — N3946 Mixed incontinence: Secondary | ICD-10-CM | POA: Diagnosis not present

## 2023-08-31 DIAGNOSIS — N3281 Overactive bladder: Secondary | ICD-10-CM | POA: Diagnosis not present

## 2023-08-31 DIAGNOSIS — N3941 Urge incontinence: Secondary | ICD-10-CM | POA: Diagnosis not present

## 2023-08-31 LAB — BLADDER SCAN AMB NON-IMAGING: Scan Result: 11

## 2023-08-31 NOTE — Progress Notes (Signed)
 Name: Pricilla Moehle DOB: 03-02-1990 MRN: [id number]  History of Present Illness: Ms. Kirks is a 33 y.o. female who presents today for follow up visit at [facility].  Relevant History includes: 1. OAB with urinary frequency, nocturia, urgency, and urge incontinence. - Vesicare  caused intolerable dry mouth. 2. Stress urinary incontinence. - Pelvic floor physical therapy not helpful per patient.  At last visit on 03/02/2023: Doing well on Ditropan  (Oxybutynin ) 10 mg daily.  Today: She reports that her urinary symptoms continue to be well managed overall on Oxybutynin . She still has some urinary urgency and occasional urge incontinence, particularly if she delays voiding. Wears one panty liner daily for protection just in case. She has reduced her caffeine intake and continues being mindful about her glycemic control. She denies straining to void or sensations of incomplete emptying.   Medications: Current Outpatient Medications  Medication Sig Dispense Refill   albuterol  (PROAIR  HFA) 108 (90 Base) MCG/ACT inhaler Inhale 1-2 puffs into the lungs every 4 (four) hours as needed. 6.7 g 0   Blood Glucose Monitoring Suppl (FREESTYLE LITE) w/Device KIT Use as directed to test 4 times daily 1 kit 0   buPROPion  (WELLBUTRIN  XL) 150 MG 24 hr tablet Take 1 tablet (150 mg total) by mouth every morning. 30 tablet 5   cholestyramine  (QUESTRAN ) 4 g packet Take 1 packet (4 g total) mixed in water  or non-carbonated beverage by mouth 3 (three) times daily. 90 packet 1   Continuous Glucose Sensor (FREESTYLE LIBRE 3 SENSOR) MISC Use to continuously monitor blood glucose, changing the sensor every 14 (fourteen) days. 2 each 6   Cyanocobalamin  (VITAMIN B-12) 1000 MCG SUBL Place 1 tablet under the tongue.     Drospirenone -Estetrol  (NEXTSTELLIS ) 3-14.2 MG TABS Take 1 tablet by mouth daily. 28 tablet 12   Dulaglutide  (TRULICITY ) 3 MG/0.5ML SOAJ Inject 3 mg into the skin once a week. 2 mL 5    Empagliflozin -metFORMIN  HCl ER (SYNJARDY  XR) 25-1000 MG TB24 Take 1 tablet by mouth daily with breakfast. 90 tablet 3   escitalopram  (LEXAPRO ) 20 MG tablet Take 1 tablet (20 mg total) by mouth daily. 90 tablet 3   Ferrous Sulfate  (IRON  PO) Take 1 tablet by mouth daily.     fluticasone  (FLONASE ) 50 MCG/ACT nasal spray Place 2 sprays into both nostrils daily. 16 g 2   gabapentin (NEURONTIN) 300 MG capsule Take 300 mg by mouth daily as needed (pain).     glucose blood (FREESTYLE LITE) test strip Use to test blood sugar 4 times daily 100 strip 5   glucose blood (FREESTYLE LITE) test strip Use to check blood sugar 2 (two) times daily as directed 200 strip 3   glucose blood test strip Use to test 2 times daily. 100 each 3   ibuprofen  (ADVIL ) 800 MG tablet Take 1 tablet (800 mg total) by mouth every 8 (eight) hours as needed for headache or moderate pain (pain score 4-6). 30 tablet 3   Lancets (FREESTYLE) lancets Use to test blood sugar 4 times daily 400 each 5   levothyroxine  (SYNTHROID ) 112 MCG tablet Take 1 tablet (112 mcg total) by mouth every morning on an empty stomach 90 tablet 3   meloxicam  (MOBIC ) 15 MG tablet Take 1 tablet (15 mg total) by mouth daily. 30 tablet 0   methocarbamol  (ROBAXIN ) 500 MG tablet Take 1 tablet (500 mg total) by mouth every 8 (eight) hours. 90 tablet 0   modafinil  (PROVIGIL ) 100 MG tablet Take 1 tablet (100  mg total) by mouth daily at start of shift as directed. 30 tablet 2   Multiple Vitamin (MULTIVITAMIN WITH MINERALS) TABS tablet Take 1 tablet by mouth daily. OTC     neomycin -polymyxin-hydrocortisone  (CORTISPORIN ) 3.5-10000-1 OTIC suspension Apply 1-2 drops daily after soaking and cover with bandaid 10 mL 0   OneTouch Delica Lancets 33G MISC Use to self monitor blood glucose twice daily (DX: ICD10: R73.01)     oxybutynin  (DITROPAN -XL) 10 MG 24 hr tablet Take 1 tablet (10 mg total) by mouth daily. 90 tablet 3   Zinc Sulfate (ZINC 15 PO) Take by mouth. OTC      Dulaglutide  (TRULICITY ) 3 MG/0.5ML SOAJ Inject 3 mg into the skin once a week. 2 mL 5   No current facility-administered medications for this visit.    Allergies: Allergies  Allergen Reactions   Metformin  Hcl Er Other (See Comments)    diarrhea   Vesicare  [Solifenacin ] Other (See Comments)    Intolerable dry mouth    Past Medical History:  Diagnosis Date   Anemia    Anxiety 03/17/2015   BV (bacterial vaginosis) 04/10/2014   Chronic lower back pain    2 herniated disc sees Dr Gaither   Complication of anesthesia    O2 desaturation, Hypotension, Fluid filled lungs   Diabetes mellitus without complication (HCC)    Encounter for menstrual regulation 03/13/2014   Family history of breast cancer    Family history of melanoma    Fatty liver 09/05/2018   Gallstones 09/05/2018   Refer to Dr Kallie   Hemorrhoids 01/12/2015   History of rectal bleeding 01/12/2015   Hypothyroidism    IC (interstitial cystitis) 01/22/2015   Iron  deficiency anemia due to chronic blood loss 06/06/2022   Pulmonary edema 08/21/2015   S/P back OR   Sleep apnea    Thyroid  disease    Tonsillitis    Vaginal discharge 03/13/2014   Vaginal itching 01/12/2015   Vestibulitis, vulvar 04/21/2014   Yeast infection 03/13/2014   Past Surgical History:  Procedure Laterality Date   BACK SURGERY     CHOLECYSTECTOMY N/A 10/17/2018   Procedure: LAPAROSCOPIC CHOLECYSTECTOMY;  Surgeon: Kallie Manuelita BROCKS, MD;  Location: [facility];  Service: General;  Laterality: N/A;   LAMINECTOMY AND MICRODISCECTOMY LUMBAR SPINE Right 08/21/2015   L4-5 and L5-S 1   Family History  Problem Relation Age of Onset   Diabetes Mother    Thyroid  disease Mother    Breast cancer Mother 70       CHEK2 pos   Thyroid  disease Brother    Diabetes Maternal Grandmother    Melanoma Maternal Grandmother        d. 36   Melanoma Maternal Grandfather        d. 10   Cancer Paternal Grandmother    Breast cancer Cousin 58       mother's pat first  cousin   Kidney cancer Other        MGF's sister   Sleep apnea Neg Hx    Social History   Socioeconomic History   Marital status: Single    Spouse name: Not on file   Number of children: Not on file   Years of education: Not on file   Highest education level: Not on file  Occupational History   Occupation: Surgical Instrument cleaner    Employer: [employer]  Tobacco Use   Smoking status: Never   Smokeless tobacco: Never  Vaping Use   Vaping status: Never Used  Substance and Sexual Activity   Alcohol  use: Yes    Comment: occasionally wine cooler   Drug use: No   Sexual activity: Not Currently    Birth control/protection: Condom, Pill  Other Topics Concern   Not on file  Social History Narrative   Not on file   Social Drivers of Health   Financial Resource Strain: Low Risk  (10/27/2022)   Overall Financial Resource Strain (CARDIA)    Difficulty of Paying Living Expenses: Not very hard  Food Insecurity: Food Insecurity Present (10/27/2022)   Hunger Vital Sign    Worried About Running Out of Food in the Last Year: Never true    Ran Out of Food in the Last Year: Sometimes true  Transportation Needs: No Transportation Needs (10/27/2022)   PRAPARE - Administrator, Civil Service (Medical): No    Lack of Transportation (Non-Medical): No  Physical Activity: Insufficiently Active (10/27/2022)   Exercise Vital Sign    Days of Exercise per Week: 1 day    Minutes of Exercise per Session: 30 min  Stress: No Stress Concern Present (10/27/2022)   [employer] of Occupational Health - Occupational Stress Questionnaire    Feeling of Stress : Only a little  Social Connections: Moderately Integrated (10/27/2022)   Social Connection and Isolation Panel    Frequency of Communication with Friends and Family: More than three times a week    Frequency of Social Gatherings with Friends and Family: Twice a week    Attends Religious Services: More than 4 times per year     Active Member of [employer] or Organizations: Yes    Attends Banker Meetings: 1 to 4 times per year    Marital Status: Never married  Intimate Partner Violence: Not At Risk (10/27/2022)   Humiliation, Afraid, Rape, and Kick questionnaire    Fear of Current or Ex-Partner: No    Emotionally Abused: No    Physically Abused: No    Sexually Abused: No    Review of Systems Constitutional: Patient denies any unintentional weight loss or change in strength lntegumentary: Patient denies any rashes or pruritus Cardiovascular: Patient denies chest pain or syncope Respiratory: Patient denies shortness of breath Gastrointestinal: Patient denies constipation Musculoskeletal: Patient denies muscle cramps or weakness Neurologic: Patient denies convulsions or seizures Allergic/Immunologic: Patient denies recent allergic reaction(s) Hematologic/Lymphatic: Patient denies bleeding tendencies Endocrine: Patient denies heat/cold intolerance  GU: As per HPI.  OBJECTIVE Vitals:   08/31/23 1515  BP: 119/79  Pulse: 93   There is no height or weight on file to calculate BMI.  Physical Examination Constitutional: No obvious distress; patient is non-toxic appearing  Cardiovascular: No visible lower extremity edema.  Respiratory: The patient does not have audible wheezing/stridor; respirations do not appear labored  Gastrointestinal: Abdomen non-distended Musculoskeletal: Normal ROM of UEs  Skin: No obvious rashes/open sores  Neurologic: CN 2-12 grossly intact Psychiatric: Answered questions appropriately with normal affect  Hematologic/Lymphatic/Immunologic: No obvious bruises or sites of spontaneous bleeding  UA: glucosuria (secondary to Jardiance  use), otherwise unremarkable PVR: 11 ml  ASSESSMENT Urge incontinence - Plan: BLADDER SCAN AMB NON-IMAGING, Urinalysis, Routine w reflex microscopic  Mixed stress and urge urinary incontinence  OAB (overactive bladder)  She is doing  well; we agreed to continue current treatment regimen. Refills are up to date. Will plan for follow up in 1 year or sooner if needed. Patient verbalized understanding and agreement. All questions were answered.  PLAN Advised the following: 1. Continue Ditropan  (Oxybutynin ) 10  mg daily.  2. Minimize / avoid caffeine intake. 3. Return in about 1 year (around 08/30/2024) for OAB, with UA & PVR.  Orders Placed This Encounter  Procedures   Urinalysis, Routine w reflex microscopic   BLADDER SCAN AMB NON-IMAGING    It has been explained that the patient is to follow regularly with their PCP in addition to all other providers involved in their care and to follow instructions provided by these respective offices. Patient advised to contact urology clinic if any urologic-pertaining questions, concerns, new symptoms or problems arise in the interim period.  Patient Instructions      Electronically signed by:  Lauraine JAYSON Oz, FNP   08/31/23    3:52 PM

## 2023-08-31 NOTE — Patient Instructions (Signed)
 SABRA

## 2023-09-01 LAB — URINALYSIS, ROUTINE W REFLEX MICROSCOPIC
Bilirubin, UA: NEGATIVE
Ketones, UA: NEGATIVE
Leukocytes,UA: NEGATIVE
Nitrite, UA: NEGATIVE
Protein,UA: NEGATIVE
RBC, UA: NEGATIVE
Specific Gravity, UA: 1.02 (ref 1.005–1.030)
Urobilinogen, Ur: 0.2 mg/dL (ref 0.2–1.0)
pH, UA: 6 (ref 5.0–7.5)

## 2023-09-05 DIAGNOSIS — R0902 Hypoxemia: Secondary | ICD-10-CM | POA: Diagnosis not present

## 2023-09-05 DIAGNOSIS — G4730 Sleep apnea, unspecified: Secondary | ICD-10-CM | POA: Diagnosis not present

## 2023-09-17 ENCOUNTER — Other Ambulatory Visit (HOSPITAL_COMMUNITY): Payer: Self-pay

## 2023-09-18 ENCOUNTER — Other Ambulatory Visit: Payer: Self-pay

## 2023-09-18 ENCOUNTER — Other Ambulatory Visit (HOSPITAL_COMMUNITY): Payer: Self-pay

## 2023-09-19 ENCOUNTER — Other Ambulatory Visit (HOSPITAL_COMMUNITY): Payer: Self-pay

## 2023-09-19 MED ORDER — MOUNJARO 10 MG/0.5ML ~~LOC~~ SOAJ
10.0000 mg | SUBCUTANEOUS | 5 refills | Status: AC
  Filled 2023-09-19 – 2024-01-09 (×3): qty 2, 28d supply, fill #0

## 2023-09-19 MED ORDER — MOUNJARO 7.5 MG/0.5ML ~~LOC~~ SOAJ
7.5000 mg | SUBCUTANEOUS | 0 refills | Status: DC
  Filled 2023-09-19: qty 2, 28d supply, fill #0

## 2023-09-19 MED ORDER — FREESTYLE LIBRE 3 PLUS SENSOR MISC
11 refills | Status: AC
  Filled 2023-09-19 – 2023-10-18 (×2): qty 2, 30d supply, fill #0
  Filled 2023-12-10: qty 2, 30d supply, fill #1

## 2023-09-19 MED ORDER — BUPROPION HCL ER (XL) 150 MG PO TB24
150.0000 mg | ORAL_TABLET | Freq: Every morning | ORAL | 5 refills | Status: DC
  Filled 2023-09-19 – 2023-10-18 (×2): qty 90, 90d supply, fill #0

## 2023-09-20 ENCOUNTER — Other Ambulatory Visit (HOSPITAL_COMMUNITY): Payer: Self-pay

## 2023-09-20 DIAGNOSIS — G4733 Obstructive sleep apnea (adult) (pediatric): Secondary | ICD-10-CM | POA: Diagnosis not present

## 2023-09-21 ENCOUNTER — Other Ambulatory Visit: Payer: Self-pay

## 2023-09-21 ENCOUNTER — Other Ambulatory Visit (HOSPITAL_COMMUNITY): Payer: Self-pay

## 2023-09-21 MED ORDER — CHOLESTYRAMINE 4 G PO PACK
4.0000 g | PACK | Freq: Three times a day (TID) | ORAL | 1 refills | Status: AC
  Filled 2023-09-21 – 2023-11-11 (×2): qty 90, 30d supply, fill #0

## 2023-10-02 ENCOUNTER — Other Ambulatory Visit (HOSPITAL_COMMUNITY): Payer: Self-pay

## 2023-10-16 DIAGNOSIS — G4726 Circadian rhythm sleep disorder, shift work type: Secondary | ICD-10-CM | POA: Diagnosis not present

## 2023-10-16 DIAGNOSIS — G4733 Obstructive sleep apnea (adult) (pediatric): Secondary | ICD-10-CM | POA: Diagnosis not present

## 2023-10-18 ENCOUNTER — Other Ambulatory Visit (HOSPITAL_COMMUNITY): Payer: Self-pay

## 2023-10-18 ENCOUNTER — Other Ambulatory Visit: Payer: Self-pay

## 2023-10-19 ENCOUNTER — Other Ambulatory Visit (HOSPITAL_COMMUNITY): Payer: Self-pay

## 2023-10-19 MED ORDER — SYNJARDY XR 25-1000 MG PO TB24
1.0000 | ORAL_TABLET | Freq: Every day | ORAL | 3 refills | Status: AC
  Filled 2023-10-19: qty 90, 90d supply, fill #0
  Filled 2023-11-11 – 2024-01-12 (×2): qty 90, 90d supply, fill #1

## 2023-10-23 ENCOUNTER — Other Ambulatory Visit (HOSPITAL_COMMUNITY): Payer: Self-pay

## 2023-10-25 ENCOUNTER — Other Ambulatory Visit (HOSPITAL_COMMUNITY): Payer: Self-pay

## 2023-10-25 MED ORDER — MOUNJARO 7.5 MG/0.5ML ~~LOC~~ SOAJ
7.5000 mg | SUBCUTANEOUS | 2 refills | Status: DC
  Filled 2023-10-25: qty 2, 28d supply, fill #0
  Filled 2023-11-13 – 2023-11-17 (×2): qty 2, 28d supply, fill #1
  Filled 2023-12-10: qty 2, 28d supply, fill #2

## 2023-10-26 ENCOUNTER — Other Ambulatory Visit (HOSPITAL_COMMUNITY): Payer: Self-pay

## 2023-11-09 DIAGNOSIS — F4323 Adjustment disorder with mixed anxiety and depressed mood: Secondary | ICD-10-CM | POA: Diagnosis not present

## 2023-11-11 ENCOUNTER — Other Ambulatory Visit (HOSPITAL_COMMUNITY): Payer: Self-pay

## 2023-11-13 ENCOUNTER — Other Ambulatory Visit (HOSPITAL_COMMUNITY): Payer: Self-pay

## 2023-11-13 ENCOUNTER — Other Ambulatory Visit: Payer: Self-pay

## 2023-11-27 ENCOUNTER — Inpatient Hospital Stay

## 2023-11-29 ENCOUNTER — Inpatient Hospital Stay

## 2023-11-29 ENCOUNTER — Inpatient Hospital Stay: Attending: Physician Assistant

## 2023-11-29 DIAGNOSIS — D72829 Elevated white blood cell count, unspecified: Secondary | ICD-10-CM

## 2023-11-29 DIAGNOSIS — D75838 Other thrombocytosis: Secondary | ICD-10-CM

## 2023-11-29 DIAGNOSIS — D75839 Thrombocytosis, unspecified: Secondary | ICD-10-CM | POA: Insufficient documentation

## 2023-11-29 DIAGNOSIS — Z1509 Genetic susceptibility to other malignant neoplasm: Secondary | ICD-10-CM | POA: Insufficient documentation

## 2023-11-29 DIAGNOSIS — Z79899 Other long term (current) drug therapy: Secondary | ICD-10-CM | POA: Insufficient documentation

## 2023-11-29 DIAGNOSIS — E039 Hypothyroidism, unspecified: Secondary | ICD-10-CM | POA: Insufficient documentation

## 2023-11-29 DIAGNOSIS — Z1501 Genetic susceptibility to malignant neoplasm of breast: Secondary | ICD-10-CM | POA: Insufficient documentation

## 2023-11-29 DIAGNOSIS — N920 Excessive and frequent menstruation with regular cycle: Secondary | ICD-10-CM | POA: Diagnosis not present

## 2023-11-29 DIAGNOSIS — D500 Iron deficiency anemia secondary to blood loss (chronic): Secondary | ICD-10-CM | POA: Diagnosis not present

## 2023-11-29 DIAGNOSIS — E119 Type 2 diabetes mellitus without complications: Secondary | ICD-10-CM | POA: Insufficient documentation

## 2023-11-29 LAB — CBC WITH DIFFERENTIAL/PLATELET
Abs Immature Granulocytes: 0.12 K/uL — ABNORMAL HIGH (ref 0.00–0.07)
Basophils Absolute: 0.1 K/uL (ref 0.0–0.1)
Basophils Relative: 1 %
Eosinophils Absolute: 0.3 K/uL (ref 0.0–0.5)
Eosinophils Relative: 2 %
HCT: 43.8 % (ref 36.0–46.0)
Hemoglobin: 13.8 g/dL (ref 12.0–15.0)
Immature Granulocytes: 1 %
Lymphocytes Relative: 29 %
Lymphs Abs: 4.2 K/uL — ABNORMAL HIGH (ref 0.7–4.0)
MCH: 27.1 pg (ref 26.0–34.0)
MCHC: 31.5 g/dL (ref 30.0–36.0)
MCV: 85.9 fL (ref 80.0–100.0)
Monocytes Absolute: 0.8 K/uL (ref 0.1–1.0)
Monocytes Relative: 6 %
Neutro Abs: 9.1 K/uL — ABNORMAL HIGH (ref 1.7–7.7)
Neutrophils Relative %: 61 %
Platelets: 384 K/uL (ref 150–400)
RBC: 5.1 MIL/uL (ref 3.87–5.11)
RDW: 14.6 % (ref 11.5–15.5)
WBC: 14.7 K/uL — ABNORMAL HIGH (ref 4.0–10.5)
nRBC: 0 % (ref 0.0–0.2)

## 2023-11-29 LAB — IRON AND TIBC
Iron: 45 ug/dL (ref 28–170)
Saturation Ratios: 13 % (ref 10.4–31.8)
TIBC: 361 ug/dL (ref 250–450)
UIBC: 316 ug/dL

## 2023-11-29 LAB — FERRITIN: Ferritin: 484 ng/mL — ABNORMAL HIGH (ref 11–307)

## 2023-12-04 ENCOUNTER — Inpatient Hospital Stay: Admitting: Physician Assistant

## 2023-12-04 NOTE — Progress Notes (Unsigned)
 [facility] [address], [place] [address]   CLINIC:  Medical Oncology/Hematology  PCP:  Larnell Hamilton, MD [address] [place] [address] [phone]  REASON FOR VISIT:  Follow-up for leukocytosis, thrombocytosis, and iron  deficiency   PRIOR THERAPY: None   CURRENT THERAPY: Ferrous sulfate  325 mg daily and intermittent IV iron   INTERVAL HISTORY:   Karen Hoover 33 y.o. female returns for routine follow-up of leukocytosis, thrombocytosis, and iron  deficiency.   She was last evaluated via telemedicine visit by Pleasant Barefoot PA-C on 06/05/2023.   Her most recent IV iron  was Venofer  400 mg on 03/15/2023.  At today's visit, she reports feeling well. No recent hospitalizations, surgeries, or changes in baseline health status. She has been taking iron  tablet every other day without any issues. She reports that her periods were previously quite heavy - now taking Nextstellis  (instead of lo-loestrin ) to help decrease her bleeding.  No heavy bleeding in the past 6 months. She denies any other source of blood loss such as hematemesis, hematochezia, melena, or epistaxis.  She feels better energy when she uses her CPAP at night. She denies any pica, restless legs, headaches, chest pain, dyspnea exertion, lightheadedness, or syncope. She denies any infections since her last visit; no B symptoms.  She has 100% energy and 75% appetite.  She endorses that she is maintaining a stable weight.  ASSESSMENT & PLAN:  1.  Leukocytosis and thrombocytosis: - Nonspecific leukocytosis with intermittently elevated lymphocytes and neutrophils at least since July 2017. - MPN work-up was negative.  (Negative BCR/ABL FISH.  Negative JAK2/CALR/MPL.  Flow cytometry negative for monoclonal B-cell population or abnormal T-cell phenotype.)  LDH normal.  Inflammatory markers were elevated (CRP 5.7, ESR 28).  Negative ANA and RF. - She is not on any steroids.  No history of  splenectomy.  No known connective tissue disease.  Non-smoker. - Denies any fevers.  She has occasional sweats, averaging once per week, waist down, for the past several months, during daytime when she sleeps.  She usually works at night.  She has not had any abnormal weight loss. - She denies any recurrent infections.     - Most recent labs (11/29/2023): WBC 14.7/ANC 9.1/lymphocytes 4.2, platelets normal (384).  Previous elevation in platelets improved after IV iron  supplementation. - PLAN: No concern for malignant leukocytosis or thrombocytosis at this time. - Suspect reactive thrombocytosis from inflammation, obesity, and iron  deficiency. - Suspect reactive leukocytosis in the setting of morbid obesity and chronic inflammation. - No further work-up planned at this time, but would consider bone marrow biopsy if any significant deviation from baseline.  2.  Iron  deficiency with microcytosis, without anemia: - CBC (12/11/2020) shows normal Hgb 13.0, but microcytosis with MCV 78.6, and elevated RBC 5.37. - Iron  panel (12/11/2020) shows iron  deficiency with ferritin 17, iron  saturation 6%, and TIBC 608.  Folate and B12 were normal. - She has intermittently heavy periods.  Currently taking Nextstellis  via GYN, and periods have been much lighter - Started ferrous sulfate  daily in November 2022, tolerating it well - Most recent IV iron  in January/February 2025 - Most recent labs (11/29/2023): Hgb 13.8/MCV 85.9, ferritin 484, iron  saturation 13% with normal TIBC 361 - PLAN: No IV iron  at this time. - Recommend to HOLD oral iron  at this time due to elevated ferritin.   - Repeat CBC and iron  panel in 6 months, followed by PHONE visit - Patient instructed to stop donating blood until her iron  deficiency  has resolved.   3.  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   Per genetic counselor note: Approximately 48% lifetime risk of breast cancer.  Annual mammogram and breast MRI screening starting at age 81, or 76 years younger than earliest age of onset.  Risk reducing mastectomy could be considered. Approximately 10 to 14% lifetime risk of colon cancer.  Colonoscopy every 5 years starting at age 38. - Patient is not sure when her mother was diagnosed with breast cancer, but thinks that it was sometime in her 43s - PLAN: Patient has been provided with copy of genetic counselor's report and instructed to follow these recommendations via her PCP. - Patient encouraged to find out specific age of for members diagnosis of cancer, so that she can start screening protocol at appropriate time.  4.  Morbid obesity - Discussed with patient various strategies toward improving her health, particularly focusing on one change at a time to make steps toward healthier lifestyle overall.  Patient is optimistic and would like to make changes to her health.     5.  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  She is a non-smoker. - Mother had breast cancer.  Maternal great grandmother had cancer.   PLAN SUMMARY: >> Labs in 6 months = CBC/D, ferritin, iron /TIBC >> PHONE visit in 6 months (after labs)     REVIEW OF SYSTEMS:   Review of Systems  Constitutional:  Negative for appetite change, chills, diaphoresis, fatigue, fever and unexpected weight change.  HENT:   Negative for lump/mass and nosebleeds.   Eyes:  Negative for eye problems.  Respiratory:  Negative for cough, hemoptysis and shortness of breath.   Cardiovascular:  Negative for chest pain, leg swelling and palpitations.  Gastrointestinal:  Negative for abdominal pain, blood in stool, constipation, diarrhea, nausea and vomiting.  Genitourinary:  Negative for hematuria.   Musculoskeletal:  Positive for back pain.  Skin: Negative.   Neurological:  Positive for numbness. Negative  for dizziness, headaches and light-headedness.  Hematological:  Does not bruise/bleed easily.  Psychiatric/Behavioral:  Negative for depression and sleep disturbance. The patient is not nervous/anxious.      PHYSICAL EXAM:  ECOG PERFORMANCE STATUS: 1 - Symptomatic but completely ambulatory  Vitals:   12/05/23 0921  BP: 110/67  Pulse: 94  Resp: 18  Temp: 97.9 F (36.6 C)  SpO2: 98%    Filed Weights   12/05/23 0921  Weight: 261 lb (118.4 kg)    Physical Exam Constitutional:      Appearance: Normal appearance. She is morbidly obese.  Cardiovascular:     Heart sounds: Normal heart sounds.  Pulmonary:     Breath sounds: Normal breath sounds.  Neurological:     General: No focal deficit present.     Mental Status: Mental status is at baseline.  Psychiatric:        Behavior: Behavior normal. Behavior is cooperative.    PAST MEDICAL/SURGICAL HISTORY:  Past Medical History:  Diagnosis Date   Anemia    Anxiety 03/17/2015   BV (bacterial vaginosis) 04/10/2014   Chronic lower back pain    2 herniated disc sees Dr Gaither   Complication of anesthesia    O2 desaturation, Hypotension, Fluid filled lungs   Diabetes mellitus without complication (HCC)    Encounter for menstrual regulation 03/13/2014  Family history of breast cancer    Family history of melanoma    Fatty liver 09/05/2018   Gallstones 09/05/2018   Refer to Dr Kallie   Hemorrhoids 01/12/2015   History of rectal bleeding 01/12/2015   Hypothyroidism    IC (interstitial cystitis) 01/22/2015   Iron  deficiency anemia due to chronic blood loss 06/06/2022   Pulmonary edema 08/21/2015   S/P back OR   Sleep apnea    Thyroid  disease    Tonsillitis    Vaginal discharge 03/13/2014   Vaginal itching 01/12/2015   Vestibulitis, vulvar 04/21/2014   Yeast infection 03/13/2014   Past Surgical History:  Procedure Laterality Date   BACK SURGERY     CHOLECYSTECTOMY N/A 10/17/2018   Procedure: LAPAROSCOPIC CHOLECYSTECTOMY;   Surgeon: Kallie Manuelita BROCKS, MD;  Location: [facility];  Service: General;  Laterality: N/A;   LAMINECTOMY AND MICRODISCECTOMY LUMBAR SPINE Right 08/21/2015   L4-5 and L5-S 1    SOCIAL HISTORY:  Social History   Socioeconomic History   Marital status: Single    Spouse name: Not on file   Number of children: Not on file   Years of education: Not on file   Highest education level: Not on file  Occupational History   Occupation: Surgical Instrument cleaner    Employer: [employer]  Tobacco Use   Smoking status: Never   Smokeless tobacco: Never  Vaping Use   Vaping status: Never Used  Substance and Sexual Activity   Alcohol  use: Yes    Comment: occasionally wine cooler   Drug use: No   Sexual activity: Not Currently    Birth control/protection: Condom, Pill  Other Topics Concern   Not on file  Social History Narrative   Not on file   Social Drivers of Health   Financial Resource Strain: Low Risk  (10/27/2022)   Overall Financial Resource Strain (CARDIA)    Difficulty of Paying Living Expenses: Not very hard  Food Insecurity: Food Insecurity Present (10/27/2022)   Hunger Vital Sign    Worried About Radiation Protection Practitioner of Food in the Last Year: Never true    Ran Out of Food in the Last Year: Sometimes true  Transportation Needs: No Transportation Needs (10/27/2022)   PRAPARE - Administrator, Civil Service (Medical): No    Lack of Transportation (Non-Medical): No  Physical Activity: Insufficiently Active (10/27/2022)   Exercise Vital Sign    Days of Exercise per Week: 1 day    Minutes of Exercise per Session: 30 min  Stress: No Stress Concern Present (10/27/2022)   [employer] of Occupational Health - Occupational Stress Questionnaire    Feeling of Stress : Only a little  Social Connections: Moderately Integrated (10/27/2022)   Social Connection and Isolation Panel    Frequency of Communication with Friends and Family: More than three times a week     Frequency of Social Gatherings with Friends and Family: Twice a week    Attends Religious Services: More than 4 times per year    Active Member of [employer] or Organizations: Yes    Attends Banker Meetings: 1 to 4 times per year    Marital Status: Never married  Intimate Partner Violence: Not At Risk (10/27/2022)   Humiliation, Afraid, Rape, and Kick questionnaire    Fear of Current or Ex-Partner: No    Emotionally Abused: No    Physically Abused: No    Sexually Abused: No    FAMILY HISTORY:  Family History  Problem Relation Age of Onset   Diabetes Mother    Thyroid  disease Mother    Breast cancer Mother 65       CHEK2 pos   Thyroid  disease Brother    Diabetes Maternal Grandmother    Melanoma Maternal Grandmother        d. 30   Melanoma Maternal Grandfather        d. 40   Cancer Paternal Grandmother    Breast cancer Cousin 23       mother's pat first cousin   Kidney cancer Other        MGF's sister   Sleep apnea Neg Hx     CURRENT MEDICATIONS:  Outpatient Encounter Medications as of 12/05/2023  Medication Sig   albuterol  (PROAIR  HFA) 108 (90 Base) MCG/ACT inhaler Inhale 1-2 puffs into the lungs every 4 (four) hours as needed.   Blood Glucose Monitoring Suppl (FREESTYLE LITE) w/Device KIT Use as directed to test 4 times daily   buPROPion  (WELLBUTRIN  XL) 150 MG 24 hr tablet Take 1 tablet (150 mg total) by mouth every morning.   buPROPion  (WELLBUTRIN  XL) 150 MG 24 hr tablet Take 1 tablet (150 mg total) by mouth every morning.   cholestyramine  (QUESTRAN ) 4 g packet Mix the contents of 1 packet in water  or non-carbonated drink and drink by mouth three times per day   Continuous Glucose Sensor (FREESTYLE LIBRE 3 PLUS SENSOR) MISC Use to check blood sugar levels continuously. Change every 15 days.   Continuous Glucose Sensor (FREESTYLE LIBRE 3 SENSOR) MISC Use to continuously monitor blood glucose, changing the sensor every 14 (fourteen) days.   Cyanocobalamin  (VITAMIN  B-12) 1000 MCG SUBL Place 1 tablet under the tongue.   Drospirenone -Estetrol  (NEXTSTELLIS ) 3-14.2 MG TABS Take 1 tablet by mouth daily.   Empagliflozin -metFORMIN  HCl ER (SYNJARDY  XR) 25-1000 MG TB24 Take 1 tablet by mouth daily with breakfast.   escitalopram  (LEXAPRO ) 20 MG tablet Take 1 tablet (20 mg total) by mouth daily.   Ferrous Sulfate  (IRON  PO) Take 1 tablet by mouth daily.   fluticasone  (FLONASE ) 50 MCG/ACT nasal spray Place 2 sprays into both nostrils daily.   gabapentin (NEURONTIN) 300 MG capsule Take 300 mg by mouth daily as needed (pain).   glucose blood (FREESTYLE LITE) test strip Use to test blood sugar 4 times daily   glucose blood (FREESTYLE LITE) test strip Use to check blood sugar 2 (two) times daily as directed   glucose blood test strip Use to test 2 times daily.   ibuprofen  (ADVIL ) 800 MG tablet Take 1 tablet (800 mg total) by mouth every 8 (eight) hours as needed for headache or moderate pain (pain score 4-6).   Lancets (FREESTYLE) lancets Use to test blood sugar 4 times daily   levothyroxine  (SYNTHROID ) 112 MCG tablet Take 1 tablet (112 mcg total) by mouth every morning on an empty stomach   meloxicam  (MOBIC ) 15 MG tablet Take 1 tablet (15 mg total) by mouth daily.   methocarbamol  (ROBAXIN ) 500 MG tablet Take 1 tablet (500 mg total) by mouth every 8 (eight) hours.   modafinil  (PROVIGIL ) 100 MG tablet Take 1 tablet (100 mg total) by mouth daily at start of shift as directed.   Multiple Vitamin (MULTIVITAMIN WITH MINERALS) TABS tablet Take 1 tablet by mouth daily. OTC   neomycin -polymyxin-hydrocortisone  (CORTISPORIN ) 3.5-10000-1 OTIC suspension Apply 1-2 drops daily after soaking and cover with bandaid   OneTouch Delica Lancets 33G MISC Use to self monitor blood glucose twice daily (DX:  ICD10: R73.01)   oxybutynin  (DITROPAN -XL) 10 MG 24 hr tablet Take 1 tablet (10 mg total) by mouth daily.   tirzepatide  (MOUNJARO ) 10 MG/0.5ML Pen Inject 10 mg into the skin once a week.    tirzepatide  (MOUNJARO ) 7.5 MG/0.5ML Pen Inject 7.5 mg into the skin once a week.   Zinc Sulfate (ZINC 15 PO) Take by mouth. OTC   No facility-administered encounter medications on file as of 12/05/2023.    ALLERGIES:  Allergies  Allergen Reactions   Metformin  Hcl Er Other (See Comments)    diarrhea   Vesicare  [Solifenacin ] Other (See Comments)    Intolerable dry mouth    LABORATORY DATA:  I have reviewed the labs as listed.  CBC    Component Value Date/Time   WBC 14.7 (H) 11/29/2023 0919   RBC 5.10 11/29/2023 0919   HGB 13.8 11/29/2023 0919   HCT 43.8 11/29/2023 0919   PLT 384 11/29/2023 0919   MCV 85.9 11/29/2023 0919   MCH 27.1 11/29/2023 0919   MCHC 31.5 11/29/2023 0919   RDW 14.6 11/29/2023 0919   LYMPHSABS 4.2 (H) 11/29/2023 0919   MONOABS 0.8 11/29/2023 0919   EOSABS 0.3 11/29/2023 0919   BASOSABS 0.1 11/29/2023 0919      Latest Ref Rng & Units 11/28/2022   12:00 AM 06/01/2020    6:37 PM 10/16/2018   10:24 AM  CMP  Glucose 70 - 99 mg/dL  842  769   BUN 6 - 20 mg/dL  15  11   Creatinine 9.55 - 1.00 mg/dL  9.53  9.54   Sodium 864 - 145 mmol/L  133  136   Potassium 3.5 - 5.1 mmol/L  4.7  4.5   Chloride 98 - 111 mmol/L  101  103   CO2 22 - 32 mmol/L  20  23   Calcium 8.9 - 10.3 mg/dL  9.4  9.1   Total Protein 6.5 - 8.1 g/dL  7.6  7.1   Total Bilirubin 0.3 - 1.2 mg/dL  0.5  0.7   Alkaline Phos 38 - 126 U/L  68  49   AST 15 - 41 U/L  41  20   ALT  1.005-1.030  41  24     DIAGNOSTIC IMAGING:  I have independently reviewed the relevant imaging and discussed with the patient.   WRAP UP:  All questions were answered. The patient knows to call the clinic with any problems, questions or concerns.  Medical decision making: Moderate  Time spent on visit: I spent 20 minutes counseling the patient face to face. The total time spent in the appointment was 30 minutes and more than 50% was on counseling.  Pleasant CHRISTELLA Barefoot, PA-C  12/05/23 10:17 AM

## 2023-12-05 ENCOUNTER — Inpatient Hospital Stay (HOSPITAL_BASED_OUTPATIENT_CLINIC_OR_DEPARTMENT_OTHER): Admitting: Physician Assistant

## 2023-12-05 DIAGNOSIS — D72829 Elevated white blood cell count, unspecified: Secondary | ICD-10-CM

## 2023-12-05 DIAGNOSIS — E039 Hypothyroidism, unspecified: Secondary | ICD-10-CM | POA: Diagnosis not present

## 2023-12-05 DIAGNOSIS — Z1501 Genetic susceptibility to malignant neoplasm of breast: Secondary | ICD-10-CM | POA: Diagnosis not present

## 2023-12-05 DIAGNOSIS — Z1509 Genetic susceptibility to other malignant neoplasm: Secondary | ICD-10-CM | POA: Diagnosis not present

## 2023-12-05 DIAGNOSIS — Z79899 Other long term (current) drug therapy: Secondary | ICD-10-CM | POA: Diagnosis not present

## 2023-12-05 DIAGNOSIS — D75838 Other thrombocytosis: Secondary | ICD-10-CM | POA: Diagnosis not present

## 2023-12-05 DIAGNOSIS — E119 Type 2 diabetes mellitus without complications: Secondary | ICD-10-CM | POA: Diagnosis not present

## 2023-12-05 DIAGNOSIS — D500 Iron deficiency anemia secondary to blood loss (chronic): Secondary | ICD-10-CM | POA: Diagnosis not present

## 2023-12-05 DIAGNOSIS — N920 Excessive and frequent menstruation with regular cycle: Secondary | ICD-10-CM | POA: Diagnosis not present

## 2023-12-05 DIAGNOSIS — D75839 Thrombocytosis, unspecified: Secondary | ICD-10-CM | POA: Diagnosis not present

## 2023-12-05 NOTE — Patient Instructions (Signed)
 [facility] at [facility] **VISIT SUMMARY & IMPORTANT INSTRUCTIONS **   You were seen today by Pleasant Barefoot PA-C for your iron  deficiency anemia.    Your iron  deficiency anemia is related to your history of heavy periods. Now that your periods are lighter, your blood counts have been looking much better! You currently are not anemic, and your iron  levels look great. I would like you to STOP taking iron  tablet at this time. Will recheck your labs in 6 months.  Your white blood cells and platelets remain mildly elevated but stable.  You are at increased risk of cancer due to your family history and your CHEK2 mutation.  The following recommendations are a summary of instructions from the genetic counselor you saw on 11/01/2017: You have 48% lifetime risk of breast cancer. Please find out at what age your mom was diagnosed with breast cancer. You will need to start having annual mammogram and breast MRI 10 years before the age when your mom was diagnosed with breast cancer. You could consider decreasing your risk of breast cancer by having a bilateral mastectomy (surgical removal of breasts), as some women with the CHEK2 mutation choose to do. You have a 10 to 14% lifetime risk of colon cancer.  You will need colonoscopy every 5 years starting at age 33.  FOLLOW-UP APPOINTMENT: PHONE visit in 6 months, with labs the week before   ** Thank you for trusting me with your healthcare!  I strive to provide all of my patients with quality care at each visit.  If you receive a survey for this visit, I would be so grateful to you for taking the time to provide feedback.  Thank you in advance!  ~ Nieve Rojero                                        Dr. Mickiel Davonna Pleasant Barefoot, PA-C     Delon Hope, NP   - - - - - - - - - - - - - - - - - -     Thank you for choosing [facility] at [facility] to provide your oncology and hematology care.   To afford each patient quality time with our provider, please arrive at least 15 minutes before your scheduled appointment time.   If you have a lab appointment with the Cancer Center please come in thru the Main Entrance and check in at the main information desk.  You need to re-schedule your appointment should you arrive 10 or more minutes late.  We strive to give you quality time with our providers, and arriving late affects you and other patients whose appointments are after yours.  Also, if you no show three or more times for appointments you may be dismissed from the clinic at the providers discretion.     Again, thank you for choosing [facility].  Our hope is that these requests will decrease the amount of time that you wait before being seen by our physicians.       _____________________________________________________________  Should you have questions after your visit to [facility], please contact our office at [phone] and follow the prompts.  Our office hours are 8:00 a.m. and 4:30 p.m. Monday - Friday.  Please note that voicemails left after 4:00 p.m. may not  be returned until the following business day.  We are closed weekends and major holidays.  You do have access to a nurse 24-7, just call the main number to the clinic [phone] and do not press any options, hold on the line and a nurse will answer the phone.    For prescription refill requests, have your pharmacy contact our office and allow 72 hours.

## 2023-12-10 ENCOUNTER — Other Ambulatory Visit: Payer: Self-pay

## 2023-12-10 ENCOUNTER — Other Ambulatory Visit: Payer: Self-pay | Admitting: Adult Health

## 2023-12-11 ENCOUNTER — Other Ambulatory Visit (HOSPITAL_COMMUNITY): Payer: Self-pay

## 2023-12-11 ENCOUNTER — Other Ambulatory Visit: Payer: Self-pay

## 2023-12-11 MED ORDER — ESCITALOPRAM OXALATE 20 MG PO TABS
20.0000 mg | ORAL_TABLET | Freq: Every day | ORAL | 3 refills | Status: DC
  Filled 2023-12-11: qty 90, 90d supply, fill #0

## 2023-12-18 ENCOUNTER — Other Ambulatory Visit: Payer: Self-pay

## 2023-12-18 ENCOUNTER — Other Ambulatory Visit (HOSPITAL_COMMUNITY): Payer: Self-pay

## 2023-12-21 ENCOUNTER — Other Ambulatory Visit (HOSPITAL_COMMUNITY): Payer: Self-pay

## 2023-12-22 ENCOUNTER — Encounter: Payer: Self-pay | Admitting: Adult Health

## 2023-12-22 ENCOUNTER — Ambulatory Visit: Admitting: Adult Health

## 2023-12-22 ENCOUNTER — Other Ambulatory Visit (HOSPITAL_COMMUNITY): Payer: Self-pay

## 2023-12-22 VITALS — BP 120/77 | HR 85 | Ht 65.0 in | Wt 263.5 lb

## 2023-12-22 DIAGNOSIS — Z01419 Encounter for gynecological examination (general) (routine) without abnormal findings: Secondary | ICD-10-CM

## 2023-12-22 DIAGNOSIS — Z3041 Encounter for surveillance of contraceptive pills: Secondary | ICD-10-CM | POA: Insufficient documentation

## 2023-12-22 DIAGNOSIS — F418 Other specified anxiety disorders: Secondary | ICD-10-CM | POA: Diagnosis not present

## 2023-12-22 DIAGNOSIS — F32A Depression, unspecified: Secondary | ICD-10-CM | POA: Insufficient documentation

## 2023-12-22 MED ORDER — NEXTSTELLIS 3-14.2 MG PO TABS
1.0000 | ORAL_TABLET | Freq: Every day | ORAL | 4 refills | Status: AC
  Filled 2023-12-22 – 2024-01-12 (×2): qty 84, 84d supply, fill #0

## 2023-12-22 MED ORDER — ESCITALOPRAM OXALATE 20 MG PO TABS
20.0000 mg | ORAL_TABLET | Freq: Every day | ORAL | 3 refills | Status: AC
  Filled 2023-12-22: qty 90, 90d supply, fill #0

## 2023-12-22 NOTE — Progress Notes (Signed)
 Patient ID: Karen Hoover, female   DOB: 10/13/1990, 33 y.o.   MRN: [id number] History of Present Illness: Karen Hoover is a 33 year old white female, single, G0P0, in for a well woman gyn exam. She is doing good on Nextstellis  she says.     Component Value Date/Time   DIAGPAP  10/27/2022 1132    - Negative for intraepithelial lesion or malignancy (NILM)   DIAGPAP  07/17/2019 1119    - Negative for intraepithelial lesion or malignancy (NILM)   DIAGPAP  03/23/2016 0000    NEGATIVE FOR INTRAEPITHELIAL LESIONS OR MALIGNANCY.   HPVHIGH Negative 10/27/2022 1132   HPVHIGH Negative 07/17/2019 1119   ADEQPAP Satisfactory for evaluation. 10/27/2022 1132   ADEQPAP  07/17/2019 1119    Satisfactory for evaluation; transformation zone component PRESENT.   ADEQPAP  03/23/2016 0000    Satisfactory for evaluation  endocervical/transformation zone component PRESENT.    PCP is Dr Larnell   Current Medications, Allergies, Past Medical History, Past Surgical History, Family History and Social History were reviewed in [facility] electronic medical record.     Review of Systems: Patient denies any headaches, hearing loss, fatigue, blurred vision, shortness of breath, chest pain, abdominal pain, problems with bowel movements, urination, or intercourse(not active). No joint pain or mood swings.  Periods are better on Nextstellis  Less stress now   Physical Exam:BP 120/77 (BP Location: Left Arm, Patient Position: Sitting, Cuff Size: Large)   Pulse 85   Ht 5' 5 (1.651 m)   Wt 263 lb 8 oz (119.5 kg)   LMP 12/20/2023 (Exact Date)   BMI 43.85 kg/m   General:  Well developed, well nourished, no acute distress Skin:  Warm and dry Neck:  Midline trachea, normal thyroid , good ROM, no lymphadenopathy Lungs; Clear to auscultation bilaterally Breast:  No dominant palpable mass, retraction, or nipple discharge Cardiovascular: Regular rate and rhythm Abdomen:  Soft, non tender, no hepatosplenomegaly Pelvic:   External genitalia is normal in appearance, no lesions.  The vagina is normal in appearance. Urethra has no lesions or masses. The cervix is smooth.  Uterus is felt to be normal size, shape, and contour.  No adnexal masses or tenderness noted.Bladder is non tender, no masses felt. Extremities/musculoskeletal:  No swelling or varicosities noted, no clubbing or cyanosis Psych:  No mood changes, alert and cooperative,seems happy AA is 1    12/22/2023    9:37 AM 12/05/2023    9:22 AM 10/27/2022   11:43 AM  Depression screen PHQ 2/9  Decreased Interest 0 0 0  Down, Depressed, Hopeless 1 0 0  PHQ - 2 Score 1 0 0  Altered sleeping 1  0  Tired, decreased energy 1  1  Change in appetite 1  1  Feeling bad or failure about yourself  0  0  Trouble concentrating 0  0  Moving slowly or fidgety/restless 0  0  Suicidal thoughts 0  0  PHQ-9 Score 4  2      Data saved with a previous flowsheet row definition       12/22/2023    9:37 AM 10/27/2022   11:43 AM 08/20/2021   12:07 PM 07/30/2020    9:43 AM  GAD 7 : Generalized Anxiety Score  Nervous, Anxious, on Edge 1 1 1 2   Control/stop worrying 1 1 2 2   Worry too much - different things 0 0 2 2  Trouble relaxing 0 0 1 1  Restless 0 0 0 2  Easily annoyed or irritable  1 0 2 3  Afraid - awful might happen 1 1 2 1   Total GAD 7 Score 4 3 10 13     Upstream - 12/22/23 0934       Pregnancy Intention Screening   Does the patient want to become pregnant in the next year? No    Does the patient's partner want to become pregnant in the next year? No    Would the patient like to discuss contraceptive options today? No      Contraception Wrap Up   Current Method Abstinence;Oral Contraceptive    End Method Abstinence;Oral Contraceptive    Contraception Counseling Provided Yes            Examination chaperoned by Clarita Salt LPN  Impression and plan: 1. Encounter for well woman exam with routine gynecological exam (Primary) Physical in 1  year Pap in 2027 Lab with PCP  2. Encounter for surveillance of contraceptive pills Happy with Nextstellis , periods much better  Meds ordered this encounter  Medications   Drospirenone -Estetrol  (NEXTSTELLIS ) 3-14.2 MG TABS    Sig: Take 1 tablet by mouth daily.    Dispense:  84 tablet    Refill:  4    Supervising Provider:   JAYNE MINDER H [[id number]]   escitalopram  (LEXAPRO ) 20 MG tablet    Sig: Take 1 tablet (20 mg total) by mouth daily.    Dispense:  90 tablet    Refill:  3    Supervising Provider:   JAYNE MINDER H [[id number]]     3. Anxiety and depression She says she is seeing counselor monthly and good on meds Will refill lexapro  20 mg 1 daily She is also in Wellbutrin 

## 2023-12-25 DIAGNOSIS — Z1389 Encounter for screening for other disorder: Secondary | ICD-10-CM | POA: Diagnosis not present

## 2023-12-25 DIAGNOSIS — D509 Iron deficiency anemia, unspecified: Secondary | ICD-10-CM | POA: Diagnosis not present

## 2024-01-09 ENCOUNTER — Other Ambulatory Visit (HOSPITAL_COMMUNITY): Payer: Self-pay

## 2024-01-09 MED ORDER — FREESTYLE LIBRE 3 PLUS SENSOR MISC
11 refills | Status: AC
  Filled 2024-01-09: qty 2, 30d supply, fill #0
  Filled 2024-02-09 – 2024-02-21 (×2): qty 2, 30d supply, fill #1

## 2024-01-09 MED ORDER — MOUNJARO 12.5 MG/0.5ML ~~LOC~~ SOAJ
12.5000 mg | SUBCUTANEOUS | 3 refills | Status: AC
  Filled 2024-01-09 – 2024-02-21 (×3): qty 2, 28d supply, fill #0

## 2024-01-13 ENCOUNTER — Other Ambulatory Visit (HOSPITAL_COMMUNITY): Payer: Self-pay

## 2024-01-14 ENCOUNTER — Other Ambulatory Visit: Payer: Self-pay

## 2024-02-09 ENCOUNTER — Other Ambulatory Visit (HOSPITAL_COMMUNITY): Payer: Self-pay

## 2024-02-09 ENCOUNTER — Other Ambulatory Visit: Payer: Self-pay

## 2024-02-19 ENCOUNTER — Other Ambulatory Visit (HOSPITAL_COMMUNITY): Payer: Self-pay

## 2024-02-19 ENCOUNTER — Encounter (HOSPITAL_COMMUNITY): Payer: Self-pay

## 2024-02-19 MED ORDER — NORGESTIMATE-ETH ESTRADIOL 0.25-35 MG-MCG PO TABS
1.0000 | ORAL_TABLET | Freq: Every day | ORAL | 1 refills | Status: AC
  Filled 2024-02-19: qty 84, 84d supply, fill #0

## 2024-02-20 ENCOUNTER — Other Ambulatory Visit (HOSPITAL_COMMUNITY): Payer: Self-pay

## 2024-02-21 ENCOUNTER — Other Ambulatory Visit (HOSPITAL_COMMUNITY): Payer: Self-pay

## 2024-02-22 ENCOUNTER — Other Ambulatory Visit (HOSPITAL_COMMUNITY): Payer: Self-pay

## 2024-02-22 ENCOUNTER — Encounter (HOSPITAL_COMMUNITY): Payer: Self-pay

## 2024-02-22 ENCOUNTER — Other Ambulatory Visit: Payer: Self-pay

## 2024-02-22 MED ORDER — ESCITALOPRAM OXALATE 20 MG PO TABS
20.0000 mg | ORAL_TABLET | Freq: Every day | ORAL | 3 refills | Status: AC
  Filled 2024-02-22: qty 90, 90d supply, fill #0

## 2024-02-22 MED ORDER — OXYBUTYNIN CHLORIDE ER 10 MG PO TB24
10.0000 mg | ORAL_TABLET | Freq: Every day | ORAL | 0 refills | Status: AC
  Filled 2024-02-22: qty 30, 30d supply, fill #0

## 2024-02-22 MED ORDER — SYNJARDY XR 25-1000 MG PO TB24
1.0000 | ORAL_TABLET | Freq: Every day | ORAL | 3 refills | Status: AC
  Filled 2024-02-22: qty 90, 90d supply, fill #0

## 2024-02-22 MED ORDER — CHOLESTYRAMINE 4 G PO PACK
1.0000 | PACK | Freq: Three times a day (TID) | ORAL | 1 refills | Status: AC
  Filled 2024-02-22: qty 90, 30d supply, fill #0

## 2024-02-22 MED ORDER — FREESTYLE LITE TEST VI STRP
ORAL_STRIP | 5 refills | Status: AC
  Filled 2024-02-22: qty 300, 75d supply, fill #0

## 2024-02-22 MED ORDER — LEVOTHYROXINE SODIUM 112 MCG PO TABS
112.0000 ug | ORAL_TABLET | Freq: Every morning | ORAL | 3 refills | Status: AC
  Filled 2024-02-22: qty 90, 90d supply, fill #0

## 2024-02-22 MED ORDER — MOUNJARO 12.5 MG/0.5ML ~~LOC~~ SOAJ
12.5000 mg | SUBCUTANEOUS | 3 refills | Status: AC
  Filled 2024-02-22: qty 2, 28d supply, fill #0

## 2024-02-22 MED ORDER — ALBUTEROL SULFATE HFA 108 (90 BASE) MCG/ACT IN AERS
1.0000 | INHALATION_SPRAY | RESPIRATORY_TRACT | 0 refills | Status: AC | PRN
  Filled 2024-02-22: qty 6.7, 17d supply, fill #0

## 2024-02-22 MED ORDER — FREESTYLE LIBRE 3 SENSOR MISC
11 refills | Status: AC
  Filled 2024-02-22: qty 2, 30d supply, fill #0

## 2024-02-22 MED ORDER — BUPROPION HCL ER (XL) 150 MG PO TB24
150.0000 mg | ORAL_TABLET | Freq: Every day | ORAL | 5 refills | Status: AC
  Filled 2024-02-22: qty 90, 90d supply, fill #0

## 2024-02-22 MED ORDER — FREESTYLE LANCETS MISC
Freq: Four times a day (QID) | 5 refills | Status: AC
  Filled 2024-02-22: qty 300, 75d supply, fill #0

## 2024-03-04 ENCOUNTER — Other Ambulatory Visit (HOSPITAL_COMMUNITY): Payer: Self-pay

## 2024-06-04 ENCOUNTER — Inpatient Hospital Stay

## 2024-06-11 ENCOUNTER — Inpatient Hospital Stay: Admitting: Physician Assistant

## 2024-08-30 ENCOUNTER — Ambulatory Visit: Admitting: Urology
